# Patient Record
Sex: Male | Born: 1935 | Race: White | Hispanic: No | Marital: Single | Smoking: Never smoker
Health system: Southern US, Community
[De-identification: ages and names within clinical notes are randomized; demographics above are authoritative.]

## PROBLEM LIST (undated history)

## (undated) DIAGNOSIS — G629 Polyneuropathy, unspecified: Secondary | ICD-10-CM

## (undated) DIAGNOSIS — I82409 Acute embolism and thrombosis of unspecified deep veins of unspecified lower extremity: Secondary | ICD-10-CM

## (undated) DIAGNOSIS — G4700 Insomnia, unspecified: Secondary | ICD-10-CM

## (undated) DIAGNOSIS — R413 Other amnesia: Secondary | ICD-10-CM

## (undated) HISTORY — PX: OTHER SURGICAL HISTORY: SHX169

---

## 2016-05-26 ENCOUNTER — Encounter: Payer: Self-pay | Admitting: Emergency Medicine

## 2016-05-26 ENCOUNTER — Emergency Department: Payer: Medicare Other

## 2016-05-26 ENCOUNTER — Emergency Department
Admission: EM | Admit: 2016-05-26 | Discharge: 2016-05-26 | Disposition: A | Discharge status: Home / Self Care | Payer: Medicare Other | Attending: Emergency Medicine | Admitting: Emergency Medicine

## 2016-05-26 DIAGNOSIS — R2241 Localized swelling, mass and lump, right lower limb: Secondary | ICD-10-CM | POA: Diagnosis present

## 2016-05-26 DIAGNOSIS — R609 Edema, unspecified: Secondary | ICD-10-CM

## 2016-05-26 DIAGNOSIS — I824Z2 Acute embolism and thrombosis of unspecified deep veins of left distal lower extremity: Secondary | ICD-10-CM | POA: Insufficient documentation

## 2016-05-26 DIAGNOSIS — L03115 Cellulitis of right lower limb: Secondary | ICD-10-CM | POA: Insufficient documentation

## 2016-05-26 DIAGNOSIS — I82402 Acute embolism and thrombosis of unspecified deep veins of left lower extremity: Secondary | ICD-10-CM

## 2016-05-26 HISTORY — DX: Insomnia, unspecified: G47.00

## 2016-05-26 HISTORY — DX: Polyneuropathy, unspecified: G62.9

## 2016-05-26 HISTORY — DX: Other amnesia: R41.3

## 2016-05-26 HISTORY — DX: Acute embolism and thrombosis of unspecified deep veins of unspecified lower extremity: I82.409

## 2016-05-26 LAB — BASIC METABOLIC PANEL
ANION GAP: 8 (ref 5–15)
BUN: 17 mg/dL (ref 6–20)
CALCIUM: 8.5 mg/dL — AB (ref 8.9–10.3)
CHLORIDE: 106 mmol/L (ref 101–111)
CO2: 25 mmol/L (ref 22–32)
Creatinine, Ser: 0.95 mg/dL (ref 0.61–1.24)
GFR calc non Af Amer: >60 mL/min (ref >60)
Glucose, Bld: 178 mg/dL — ABNORMAL HIGH (ref 65–99)
Potassium: 3.4 mmol/L — ABNORMAL LOW (ref 3.5–5.1)
Sodium: 139 mmol/L (ref 135–145)

## 2016-05-26 LAB — CBC
HCT: 42.9 % (ref 40.0–52.0)
HEMOGLOBIN: 14.5 g/dL (ref 13.0–18.0)
MCH: 30.8 pg (ref 26.0–34.0)
MCHC: 33.8 g/dL (ref 32.0–36.0)
MCV: 91.2 fL (ref 80.0–100.0)
Platelets: 193 10*3/uL (ref 150–440)
RBC: 4.7 MIL/uL (ref 4.40–5.90)
RDW: 15.7 % — ABNORMAL HIGH (ref 11.5–14.5)
WBC: 9.7 10*3/uL (ref 3.8–10.6)

## 2016-05-26 LAB — TROPONIN I

## 2016-05-26 MED ORDER — CEPHALEXIN 500 MG PO CAPS
500.0000 mg | ORAL_CAPSULE | Freq: Once | ORAL | Status: AC
  Administered 2016-05-26: 500 mg via ORAL
  Filled 2016-05-26: qty 1

## 2016-05-26 MED ORDER — CEPHALEXIN 500 MG PO CAPS: 500.0000 mg | ORAL_CAPSULE | Freq: Three times a day (TID) | ORAL | 0 refills | Status: DC

## 2016-05-26 MED ORDER — ASPIRIN EC 325 MG PO TBEC
DELAYED_RELEASE_TABLET | ORAL | Status: AC
  Administered 2016-05-26: 325 mg
  Filled 2016-05-26: qty 1

## 2016-05-26 MED ORDER — ASPIRIN EC 325 MG PO TBEC: 325.0000 mg | DELAYED_RELEASE_TABLET | Freq: Every day | ORAL | 0 refills | Status: DC

## 2016-05-26 MED ORDER — ASPIRIN 325 MG PO TABS
325.0000 mg | ORAL_TABLET | Freq: Every day | ORAL | Status: DC
  Administered 2016-05-26: 325 mg via ORAL

## 2016-05-26 NOTE — ED Triage Notes (Signed)
Pt c/o ankle swelling on and off for 3/4 year. Reports started again today to both ankles. Denies SHOB or CP. Has pain to ankles.

## 2016-05-26 NOTE — ED Provider Notes (Addendum)
[facility] Emergency Department Provider Note  Time seen: 7:07 PM  I have reviewed the triage vital signs and the nursing notes.   HISTORY  Chief Complaint Leg Swelling    HPI Charles Nelson is a 82 y.o. male with a past medical history of insomnia, neuropathy, who presents the emergency department for right lower extremity swelling. According to the patient beginning this morning he has been experiencing some pain in his right foot with swelling of the foot and ankle. Patient denies any trauma. Patient denies any chest pain or trouble breathing. Patient states he has never had a DVT in the past however his past medical history suggests that he has. Patient denies any fever, nausea or vomiting.   Past Medical History:  Diagnosis Date  . DVT (deep venous thrombosis) ([place])   . Insomnia   . Memory loss   . Neuropathy ([place])     There are no active problems to display for this patient.   Past Surgical History:  Procedure Laterality Date  . pt does not remember surgery hx      Prior to Admission medications   Not on File    No Known Allergies  History reviewed. No pertinent family history.  Social History Social History  Substance Use Topics  . Smoking status: Never Smoker  . Smokeless tobacco: Not on file  . Alcohol use No    Review of Systems Constitutional: Negative for fever. Cardiovascular: Negative for chest pain. Respiratory: Negative for shortness of breath. Gastrointestinal: Negative for abdominal pain Neurological: Negative for headache 10-point ROS otherwise negative.  ____________________________________________   PHYSICAL EXAM:  VITAL SIGNS: ED Triage Vitals  Enc Vitals Group     BP 05/26/16 1745 126/68     Pulse Rate 05/26/16 1745 90     Resp 05/26/16 1745 18     Temp 05/26/16 1745 98.7 F (37.1 C)     Temp Source 05/26/16 1745 Oral     SpO2 05/26/16 1745 96 %     Weight 05/26/16 1746 205 lb (93 kg)     Height  05/26/16 1746 6' (1.829 m)     Head Circumference --      Peak Flow --      Pain Score 05/26/16 1746 8     Pain Loc --      Pain Edu? --      Excl. in [place]? --     Constitutional: Alert and oriented. Well appearing and in no distress. Eyes: Normal exam ENT   Head: Normocephalic and atraumatic.   Mouth/Throat: Mucous membranes are moist. Cardiovascular: Normal rate, regular rhythm. No murmur Respiratory: Normal respiratory effort without tachypnea nor retractions. Breath sounds are clear  Gastrointestinal: Soft and nontender. No distention.   Musculoskeletal:Normal range of motion. 2+ DP pulse bilaterally. The patient does have mild erythema of the right foot with mild tenderness to palpation, mild edema present. Left lower extremity appears normal.  Neurologic:  Normal speech and language. No gross focal neurologic deficits are appreciated. Skin:  Skin is warm, dry and intact.  Psychiatric: Mood and affect are normal. Speech and behavior are normal.   ____________________________________________    EKG  EKG reviewed and interpreted by myself shows normal sinus rhythm at 93 bpm, slightly widened QRS, normal axis, largely normal intervals with nonspecific ST changes, no concerning ST elevation.  ____________________________________________    RADIOLOGY  Chest x-ray shows no acute abnormality.  ____________________________________________   INITIAL IMPRESSION / ASSESSMENT AND PLAN / ED  COURSE  Pertinent labs & imaging results that were available during my care of the patient were reviewed by me and considered in my medical decision making (see chart for details).  The patient presents emergency department with right lower extremity swelling and tenderness beginning this morning. Patient doesn't mild erythema and edema on exam. Patient's labs are largely within normal limits. Troponin is negative. Chest x-ray is normal. We'll proceed with stent of the right lower extremity  to rule out DVT.   Ultrasound shows nonocclusive left lower extremity blood clot, no right lower extremity clot seen. As there is no right lower extremity clot highly suspect the patient has a mild degree of cellulitis in the right lower extremity. Patient is definitely confused at times in the emergency department. We've discussed the patient with his caregiver at the group home which he lives. The patient does not currently have a PCP. Patient suffers from memory loss. Patient has multiple bruises over his body. I do not believe the patient would be the best candidate for heavy anticoagulation. We'll place the patient on aspirin therapy and have him follow-up with a primary care doctor [facility] in one week for reevaluation and plan to repeat an ultrasound in the next several weeks to ensure resolution.  ----------------------------------------- 9:25 PM on 05/26/2016 -----------------------------------------  We have discussed the patient with his group home caregiver who states the patient has a history of DVT in the past, currently takes warfarin every night. We will discontinue the aspirin therapy as the patient will be taken warfarin. Patient will need follow-up with a primary care doctor [facility] on Monday for recheck of his INRs the patient will be started on an antibiotic. Patient and caregiver aware and agreeable to this plan. ____________________________________________   FINAL CLINICAL IMPRESSION(S) / ED DIAGNOSES  Deep venous thrombosis Cellulitis    Harvest Dark, MD 05/26/16 2039    Harvest Dark, MD 05/26/16 2126

## 2016-05-26 NOTE — ED Notes (Signed)
Pt from group home, poor historian. Pt states swelling started last night to RLE .

## 2016-05-26 NOTE — ED Notes (Signed)
Renee Ramus nurse  from [facility], asst living has been consulted and will be getting cab for pt back to facility

## 2016-05-26 NOTE — ED Triage Notes (Signed)
Patient to ER via [facility] from [facility] group home for c/o bilateral ankle swelling x6 months.

## 2016-05-26 NOTE — Discharge Instructions (Signed)
You have been seen in the emergency department for right leg pain. You have been diagnosed with a blood clot/DVT in the left lower leg, as well as an infection in the right lower leg. You have been prescribed an antibiotic please take his antibiotic as prescribed for its entire course. Please follow-up with [facility] clinic in the next 3-4 days for recheck of your INR and for ongoing treatment.

## 2016-07-23 ENCOUNTER — Encounter: Payer: Self-pay | Admitting: Emergency Medicine

## 2016-07-23 ENCOUNTER — Emergency Department: Payer: Medicare Other

## 2016-07-23 ENCOUNTER — Emergency Department
Admission: EM | Admit: 2016-07-23 | Discharge: 2016-07-23 | Disposition: A | Discharge status: Home / Self Care | Payer: Medicare Other | Attending: Emergency Medicine | Admitting: Emergency Medicine

## 2016-07-23 DIAGNOSIS — S51012A Laceration without foreign body of left elbow, initial encounter: Secondary | ICD-10-CM | POA: Insufficient documentation

## 2016-07-23 DIAGNOSIS — Z79899 Other long term (current) drug therapy: Secondary | ICD-10-CM | POA: Diagnosis not present

## 2016-07-23 DIAGNOSIS — R0689 Other abnormalities of breathing: Secondary | ICD-10-CM | POA: Insufficient documentation

## 2016-07-23 DIAGNOSIS — R609 Edema, unspecified: Secondary | ICD-10-CM | POA: Diagnosis not present

## 2016-07-23 DIAGNOSIS — Y92129 Unspecified place in nursing home as the place of occurrence of the external cause: Secondary | ICD-10-CM | POA: Insufficient documentation

## 2016-07-23 DIAGNOSIS — Y939 Activity, unspecified: Secondary | ICD-10-CM | POA: Diagnosis not present

## 2016-07-23 DIAGNOSIS — W19XXXA Unspecified fall, initial encounter: Secondary | ICD-10-CM | POA: Diagnosis not present

## 2016-07-23 DIAGNOSIS — S59912A Unspecified injury of left forearm, initial encounter: Secondary | ICD-10-CM | POA: Diagnosis present

## 2016-07-23 DIAGNOSIS — Y999 Unspecified external cause status: Secondary | ICD-10-CM | POA: Diagnosis not present

## 2016-07-23 LAB — CBC
HCT: 40.5 % (ref 40.0–52.0)
Hemoglobin: 13.8 g/dL (ref 13.0–18.0)
MCH: 31.3 pg (ref 26.0–34.0)
MCHC: 34.1 g/dL (ref 32.0–36.0)
MCV: 91.8 fL (ref 80.0–100.0)
PLATELETS: 217 10*3/uL (ref 150–440)
RBC: 4.41 MIL/uL (ref 4.40–5.90)
RDW: 14.6 % — AB (ref 11.5–14.5)
WBC: 8.9 10*3/uL (ref 3.8–10.6)

## 2016-07-23 LAB — TROPONIN I

## 2016-07-23 LAB — BRAIN NATRIURETIC PEPTIDE: B NATRIURETIC PEPTIDE 5: 73 pg/mL (ref 0.0–100.0)

## 2016-07-23 MED ORDER — FUROSEMIDE 20 MG PO TABS: 20.0000 mg | ORAL_TABLET | Freq: Every day | ORAL | 0 refills | Status: DC

## 2016-07-23 NOTE — ED Provider Notes (Signed)
[facility] Emergency Department Provider Note  Time seen: 3:20 PM  I have reviewed the triage vital signs and the nursing notes.   HISTORY  Chief Complaint Leg Swelling and Fall    HPI Charles Nelson is a 82 y.o. male with a past medical history of DVT, was on Coumadin until recently discontinued, neuropathy, mild dementia, presents to the emergency departmentwith lower extremity edema and a fall. According to report the patient lives at a nursing facility, had a fall earlier today did not hit his head or lose consciousness. Patient did not want to go to the hospital for evaluation at that time. Suffered a small skin tear to left elbow. However later in the day at family member visited the patient and saw increased lower extremity edema, so the patient was sent to the emergency department for evaluation of the edema. Patient states he has had lower extremity swelling in the past, does not know if he takes fluid pills. Denies any trouble breathing. Denies any chest pain. States his legs feel tight and are somewhat painful when he ambulates.  Past Medical History:  Diagnosis Date  . DVT (deep venous thrombosis) ([place])   . Insomnia   . Memory loss   . Neuropathy ([place])     There are no active problems to display for this patient.   Past Surgical History:  Procedure Laterality Date  . pt does not remember surgery hx      Prior to Admission medications   Medication Sig Start Date End Date Taking? Authorizing Provider  cephALEXin (KEFLEX) 500 MG capsule Take 1 capsule (500 mg total) by mouth 3 (three) times daily. 05/26/16   Harvest Dark, MD  cephALEXin (KEFLEX) 500 MG capsule Take 1 capsule (500 mg total) by mouth 3 (three) times daily. 05/26/16   Harvest Dark, MD    No Known Allergies  No family history on file.  Social History Social History  Substance Use Topics  . Smoking status: Never Smoker  . Smokeless tobacco: Never Used  . Alcohol use  No    Review of Systems Constitutional: Negative for fever. Cardiovascular: Negative for chest pain. Respiratory: Negative for shortness of breath. Gastrointestinal: Negative for abdominal pain Musculoskeletal: Negative for back pain.Swelling in bilateral legs. Neurological: Negative for headaches, focal weakness or numbness. 10-point ROS otherwise negative.  ____________________________________________   PHYSICAL EXAM:  VITAL SIGNS: ED Triage Vitals  Enc Vitals Group     BP 07/23/16 1516 (!) 148/80     Pulse Rate 07/23/16 1516 79     Resp 07/23/16 1516 17     Temp 07/23/16 1516 98.5 F (36.9 C)     Temp Source 07/23/16 1516 Oral     SpO2 07/23/16 1516 99 %     Weight 07/23/16 1518 180 lb 8 oz (81.9 kg)     Height 07/23/16 1518 6' (1.829 m)     Head Circumference --      Peak Flow --      Pain Score 07/23/16 1519 2     Pain Loc --      Pain Edu? --      Excl. in [place]? --     Constitutional: Alert. Well appearing and in no distress. Eyes: Normal exam ENT   Head: Normocephalic and atraumatic   Mouth/Throat: Mucous membranes are moist. Cardiovascular: Normal rate, regular rhythm. No murmur Respiratory: Normal respiratory effort without tachypnea nor retractions. Breath sounds are clear  Gastrointestinal: Soft and nontender. No distention. Musculoskeletal: Nontender  with normal range of motion in all extremities. 2+ lower extremity edema equal bilaterally. Mild tenderness. 2+ DP pulse. No erythema. Small skin tear approximately 1.5 cm to left elbow. Neurologic:  Normal speech and language. No gross focal neurologic deficits Skin:  Skin is warm, dry and intact.  Psychiatric: Mood and affect are normal.   ____________________________________________    EKG  EKG reviewed and interpreted by myself shows normal sinus rhythm at 75 bpm, slightly widened QRS, normal axis, largely normal intervals with nonspecific ST  changes.  ____________________________________________    RADIOLOGY  CXR negative  ____________________________________________   INITIAL IMPRESSION / ASSESSMENT AND PLAN / ED COURSE  Pertinent labs & imaging results that were available during my care of the patient were reviewed by me and considered in my medical decision making (see chart for details).  Patient presents emergency department with lower extremity edema. Patient also suffered a fall earlier today. Patient has a small skin tear to left elbow, hemostatic. No repair needed. No other evidence of trauma on examination. Patient does have 2+ lower extremity edema equal bilaterally. Mild tenderness, 2+ DP pulses. We will check labs, chest x-ray, EKG and closely monitor in the emergency department. I reviewed the patient's MAR from the nursing facility, he does not currently take a diuretic.  Chest x-ray is negative. Labs are largely within normal limits. Troponin is negative. BNP largely within normal limits. Given the patient's peripheral edema we'll start on Lasix. We'll have the patient follow up with a cardiologist for recheck within the next 1 week. Patient agreeable to plan.  ____________________________________________   FINAL CLINICAL IMPRESSION(S) / ED DIAGNOSES  Fall Skin tear Peripheral edema    Harvest Dark, MD 07/23/16 1742

## 2016-07-23 NOTE — ED Triage Notes (Signed)
Pt to ED via EMS from [place] c/o ankle and feet swelling for a couple days.  States mild pain and discomfort in feet worse when walking.  Pt also states had a fall today with skin tear to left elbow, denies hitting head.  Patient was on coumadin but recently taken off of.  Denies history of CHF, denies CP, or SOB.  Pt A&Ox4, speaking in complete and coherent sentences, chest rise even and unlabored.

## 2016-07-23 NOTE — ED Notes (Signed)
Spoke with employee, Lattie Haw, about patient's group home for report and transportation for patient on discharge.

## 2016-07-23 NOTE — ED Notes (Signed)
Patient transported to X-ray 

## 2016-07-23 NOTE — Discharge Instructions (Signed)
Please follow-up with cardiology by calling the number provided to arrange an echocardiogram. Please take his Lasix as prescribed each morning. Return to the emergency department for any trouble breathing, any chest pain, or any other symptom personally concerning to yourself.

## 2016-07-25 LAB — COMPREHENSIVE METABOLIC PANEL
ALT: 13 U/L — ABNORMAL LOW (ref 17–63)
ANION GAP: 6 (ref 5–15)
AST: 18 U/L (ref 15–41)
Albumin: 2.9 g/dL — ABNORMAL LOW (ref 3.5–5.0)
Alkaline Phosphatase: 57 U/L (ref 38–126)
BUN: 18 mg/dL (ref 6–20)
CHLORIDE: 107 mmol/L (ref 101–111)
CO2: 29 mmol/L (ref 22–32)
Calcium: 8.7 mg/dL — ABNORMAL LOW (ref 8.9–10.3)
Creatinine, Ser: 0.99 mg/dL (ref 0.61–1.24)
GFR calc Af Amer: >60 mL/min (ref >60)
Glucose, Bld: 99 mg/dL (ref 65–99)
POTASSIUM: 3.8 mmol/L (ref 3.5–5.1)
Sodium: 142 mmol/L (ref 135–145)
Total Bilirubin: 0.4 mg/dL (ref 0.3–1.2)
Total Protein: 5.7 g/dL — ABNORMAL LOW (ref 6.5–8.1)

## 2016-08-31 ENCOUNTER — Emergency Department: Payer: Medicare Other

## 2016-08-31 ENCOUNTER — Inpatient Hospital Stay
Admit: 2016-08-31 | Discharge: 2016-08-31 | Disposition: A | Discharge status: Home / Self Care | Payer: Medicare Other | Attending: Specialist | Admitting: Specialist

## 2016-08-31 ENCOUNTER — Encounter: Payer: Self-pay | Admitting: Emergency Medicine

## 2016-08-31 ENCOUNTER — Inpatient Hospital Stay
Admission: EM | Admit: 2016-08-31 | Discharge: 2016-09-03 | DRG: 176 | Disposition: A | Discharge status: Skilled Nursing Facility | Payer: Medicare Other | Attending: Internal Medicine | Admitting: Internal Medicine

## 2016-08-31 DIAGNOSIS — R600 Localized edema: Secondary | ICD-10-CM | POA: Diagnosis present

## 2016-08-31 DIAGNOSIS — R413 Other amnesia: Secondary | ICD-10-CM | POA: Diagnosis present

## 2016-08-31 DIAGNOSIS — I248 Other forms of acute ischemic heart disease: Secondary | ICD-10-CM | POA: Diagnosis present

## 2016-08-31 DIAGNOSIS — G629 Polyneuropathy, unspecified: Secondary | ICD-10-CM | POA: Diagnosis present

## 2016-08-31 DIAGNOSIS — I82433 Acute embolism and thrombosis of popliteal vein, bilateral: Secondary | ICD-10-CM | POA: Diagnosis present

## 2016-08-31 DIAGNOSIS — F0390 Unspecified dementia without behavioral disturbance: Secondary | ICD-10-CM | POA: Diagnosis present

## 2016-08-31 DIAGNOSIS — Z86711 Personal history of pulmonary embolism: Secondary | ICD-10-CM | POA: Diagnosis not present

## 2016-08-31 DIAGNOSIS — G4700 Insomnia, unspecified: Secondary | ICD-10-CM | POA: Diagnosis present

## 2016-08-31 DIAGNOSIS — Z79899 Other long term (current) drug therapy: Secondary | ICD-10-CM

## 2016-08-31 DIAGNOSIS — I82409 Acute embolism and thrombosis of unspecified deep veins of unspecified lower extremity: Secondary | ICD-10-CM | POA: Diagnosis present

## 2016-08-31 DIAGNOSIS — Z86718 Personal history of other venous thrombosis and embolism: Secondary | ICD-10-CM | POA: Diagnosis not present

## 2016-08-31 DIAGNOSIS — I2699 Other pulmonary embolism without acute cor pulmonale: Principal | ICD-10-CM | POA: Diagnosis present

## 2016-08-31 DIAGNOSIS — I4510 Unspecified right bundle-branch block: Secondary | ICD-10-CM | POA: Diagnosis present

## 2016-08-31 DIAGNOSIS — I82403 Acute embolism and thrombosis of unspecified deep veins of lower extremity, bilateral: Secondary | ICD-10-CM

## 2016-08-31 DIAGNOSIS — E876 Hypokalemia: Secondary | ICD-10-CM | POA: Diagnosis present

## 2016-08-31 DIAGNOSIS — M7989 Other specified soft tissue disorders: Secondary | ICD-10-CM | POA: Diagnosis present

## 2016-08-31 DIAGNOSIS — I82413 Acute embolism and thrombosis of femoral vein, bilateral: Secondary | ICD-10-CM | POA: Diagnosis present

## 2016-08-31 LAB — URINALYSIS, COMPLETE (UACMP) WITH MICROSCOPIC
Bacteria, UA: NONE SEEN
Bilirubin Urine: NEGATIVE
GLUCOSE, UA: NEGATIVE mg/dL
Hgb urine dipstick: NEGATIVE
KETONES UR: NEGATIVE mg/dL
Leukocytes, UA: NEGATIVE
Nitrite: NEGATIVE
PROTEIN: NEGATIVE mg/dL
Specific Gravity, Urine: 1.014 (ref 1.005–1.030)
Squamous Epithelial / LPF: NONE SEEN
pH: 6 (ref 5.0–8.0)

## 2016-08-31 LAB — CBC WITH DIFFERENTIAL/PLATELET
Basophils Absolute: 0 10*3/uL (ref 0–0.1)
Basophils Relative: 0 %
Eosinophils Absolute: 0 10*3/uL (ref 0–0.7)
Eosinophils Relative: 0 %
HEMATOCRIT: 38.2 % — AB (ref 40.0–52.0)
Hemoglobin: 13 g/dL (ref 13.0–18.0)
LYMPHS PCT: 12 %
Lymphs Abs: 1.8 10*3/uL (ref 1.0–3.6)
MCH: 30.9 pg (ref 26.0–34.0)
MCHC: 33.9 g/dL (ref 32.0–36.0)
MCV: 90.9 fL (ref 80.0–100.0)
MONO ABS: 2 10*3/uL — AB (ref 0.2–1.0)
MONOS PCT: 13 %
NEUTROS ABS: 12.1 10*3/uL — AB (ref 1.4–6.5)
Neutrophils Relative %: 75 %
Platelets: 214 10*3/uL (ref 150–440)
RBC: 4.2 MIL/uL — ABNORMAL LOW (ref 4.40–5.90)
RDW: 13.5 % (ref 11.5–14.5)
WBC: 16 10*3/uL — ABNORMAL HIGH (ref 3.8–10.6)

## 2016-08-31 LAB — BASIC METABOLIC PANEL
Anion gap: 9 (ref 5–15)
BUN: 16 mg/dL (ref 6–20)
CALCIUM: 8.6 mg/dL — AB (ref 8.9–10.3)
CO2: 27 mmol/L (ref 22–32)
CREATININE: 0.98 mg/dL (ref 0.61–1.24)
Chloride: 100 mmol/L — ABNORMAL LOW (ref 101–111)
GFR calc Af Amer: >60 mL/min (ref >60)
GFR calc non Af Amer: >60 mL/min (ref >60)
Glucose, Bld: 107 mg/dL — ABNORMAL HIGH (ref 65–99)
Potassium: 3.6 mmol/L (ref 3.5–5.1)
Sodium: 136 mmol/L (ref 135–145)

## 2016-08-31 LAB — BRAIN NATRIURETIC PEPTIDE: B Natriuretic Peptide: 62 pg/mL (ref 0.0–100.0)

## 2016-08-31 LAB — TROPONIN I
Troponin I: 0.03 ng/mL (ref <0.03)
Troponin I: 0.03 ng/mL (ref <0.03)

## 2016-08-31 MED ORDER — VITAMIN B-12 1000 MCG PO TABS
2000.0000 ug | ORAL_TABLET | Freq: Every day | ORAL | Status: DC
  Administered 2016-09-01 – 2016-09-03 (×3): 2000 ug via ORAL
  Filled 2016-08-31 (×3): qty 2

## 2016-08-31 MED ORDER — ONDANSETRON HCL 4 MG PO TABS: 4.0000 mg | ORAL_TABLET | Freq: Four times a day (QID) | ORAL | Status: DC | PRN

## 2016-08-31 MED ORDER — ACETAMINOPHEN 325 MG PO TABS
650.0000 mg | ORAL_TABLET | Freq: Once | ORAL | Status: AC | PRN
  Administered 2016-08-31: 650 mg via ORAL
  Filled 2016-08-31: qty 2

## 2016-08-31 MED ORDER — ONDANSETRON HCL 4 MG/2ML IJ SOLN: 4.0000 mg | Freq: Four times a day (QID) | INTRAMUSCULAR | Status: DC | PRN

## 2016-08-31 MED ORDER — RISPERIDONE 1 MG PO TABS
2.0000 mg | ORAL_TABLET | Freq: Every day | ORAL | Status: DC
  Administered 2016-08-31 – 2016-09-02 (×3): 2 mg via ORAL
  Filled 2016-08-31 (×3): qty 2

## 2016-08-31 MED ORDER — MELATONIN 5 MG PO TABS
10.0000 mg | ORAL_TABLET | Freq: Every day | ORAL | Status: DC
  Administered 2016-08-31 – 2016-09-02 (×3): 10 mg via ORAL
  Filled 2016-08-31 (×4): qty 2

## 2016-08-31 MED ORDER — ENOXAPARIN SODIUM 80 MG/0.8ML ~~LOC~~ SOLN
1.0000 mg/kg | Freq: Once | SUBCUTANEOUS | Status: AC
  Administered 2016-08-31: 75 mg via SUBCUTANEOUS
  Filled 2016-08-31: qty 0.8

## 2016-08-31 MED ORDER — TRAZODONE HCL 100 MG PO TABS
100.0000 mg | ORAL_TABLET | Freq: Every day | ORAL | Status: DC
  Administered 2016-08-31 – 2016-09-02 (×3): 100 mg via ORAL
  Filled 2016-08-31 (×3): qty 1

## 2016-08-31 MED ORDER — ACETAMINOPHEN 650 MG RE SUPP: 650.0000 mg | Freq: Four times a day (QID) | RECTAL | Status: DC | PRN

## 2016-08-31 MED ORDER — ENOXAPARIN SODIUM 100 MG/ML ~~LOC~~ SOLN
SUBCUTANEOUS | Status: AC
  Administered 2016-08-31: 75 mg via SUBCUTANEOUS
  Filled 2016-08-31: qty 1

## 2016-08-31 MED ORDER — IOPAMIDOL (ISOVUE-370) INJECTION 76%
75.0000 mL | Freq: Once | INTRAVENOUS | Status: AC | PRN
  Administered 2016-08-31: 75 mL via INTRAVENOUS

## 2016-08-31 MED ORDER — ENOXAPARIN SODIUM 80 MG/0.8ML ~~LOC~~ SOLN
1.0000 mg/kg | Freq: Two times a day (BID) | SUBCUTANEOUS | Status: DC
  Administered 2016-09-01 (×2): 75 mg via SUBCUTANEOUS
  Filled 2016-08-31 (×3): qty 0.8

## 2016-08-31 MED ORDER — VITAMIN D 1000 UNITS PO TABS
2000.0000 [IU] | ORAL_TABLET | Freq: Every day | ORAL | Status: DC
  Administered 2016-09-01 – 2016-09-03 (×3): 2000 [IU] via ORAL
  Filled 2016-08-31 (×3): qty 2

## 2016-08-31 MED ORDER — DOCUSATE SODIUM 100 MG PO CAPS
100.0000 mg | ORAL_CAPSULE | Freq: Two times a day (BID) | ORAL | Status: DC | PRN
  Administered 2016-09-01: 100 mg via ORAL
  Filled 2016-08-31: qty 1

## 2016-08-31 MED ORDER — ACETAMINOPHEN 325 MG PO TABS: 650.0000 mg | ORAL_TABLET | Freq: Four times a day (QID) | ORAL | Status: DC | PRN

## 2016-08-31 NOTE — ED Notes (Signed)
Pt repositioned at this time, pull up checked, pt is clean and dry.

## 2016-08-31 NOTE — ED Notes (Signed)
In and out cath completed by this RN. Peri-care performed before cath. Patient tolerated well. Clear, amber urine returned. Sterile technique maintained.

## 2016-08-31 NOTE — ED Triage Notes (Signed)
Per [facility], patient comes from [facility] c/o left leg edema x 3 months. Patient dx with DVT "awhile ago", patient has been on and off coumadin. Per staff, every time patient goes off coumadin his leg swells up. Patient currently off coumadin, stopped 06/21/16. MD at group home has doubled lasix dose since 08/19/16. Patient is A&O x2, disoriented to time and situation. Patient has a Charles Nelson, Charles Nelson ([phone]) [phone].

## 2016-08-31 NOTE — ED Notes (Signed)
Patient transported to [facility] 

## 2016-08-31 NOTE — H&P (Signed)
[facility] [place] NAME: Charles Nelson    MR#:  [id number]  DATE OF BIRTH:  07-15-1930  DATE OF ADMISSION:  08/31/2016  PRIMARY CARE PHYSICIAN: No PCP Per Patient   REQUESTING/REFERRING PHYSICIAN: Dr. Rudene Re  CHIEF COMPLAINT:   Chief Complaint  Patient presents with  . Leg Swelling    HISTORY OF PRESENT ILLNESS:  Charles Nelson  is a 83 y.o. male with a known history of Previous history of DVT, insomnia, memory loss, neuropathy who presents to the hospital due to left lower extremity swelling. Patient himself has advanced dementia and therefore most of history obtained over the phone with the patient's power of attorney. Patient was previously diagnosed with a DVT about 6 months ago at [facility] and placed on oral Coumadin. He was treated for about 3 months and then taken off the Coumadin recently. He has been complaining of leg pain and worsening left lower extremity swelling and therefore was sent to the ER for further evaluation. Patient emergency room underwent Dopplers of his lower extremity which showed bilateral DVT and also underwent CT image of the chest showing bilateral pulmonary emboli. Hospitalist services were contacted further treatment and evaluation. Patient clinically does not complain of any chest pain, shortness of breath or has any hemoptysis and is presently hemodynamically stable.  PAST MEDICAL HISTORY:   Past Medical History:  Diagnosis Date  . DVT (deep venous thrombosis) ([place])   . Insomnia   . Memory loss   . Neuropathy ([place])     PAST SURGICAL HISTORY:   Past Surgical History:  Procedure Laterality Date  . pt does not remember surgery hx      SOCIAL HISTORY:   Social History  Substance Use Topics  . Smoking status: Never Smoker  . Smokeless tobacco: Never Used  . Alcohol use No    FAMILY HISTORY:   Family History  Problem Relation Age of Onset  . Diabetes Neg Hx   . Hypertension Neg Hx   .  Deep vein thrombosis Neg Hx     DRUG ALLERGIES:  No Known Allergies  REVIEW OF SYSTEMS:   Review of Systems  Unable to perform ROS: Mental acuity    MEDICATIONS AT HOME:   Prior to Admission medications   Medication Sig Start Date End Date Taking? Authorizing Provider  docusate sodium (COLACE) 100 MG capsule Take 100 mg by mouth 2 (two) times daily as needed for mild constipation.   Yes Historical Provider, MD  Ergocalciferol (VITAMIN D2) 2000 units TABS Take 2,000 Units by mouth daily.   Yes Historical Provider, MD  furosemide (LASIX) 20 MG tablet Take 1 tablet (20 mg total) by mouth daily. Patient taking differently: Take 20 mg by mouth 2 (two) times daily.  07/23/16 07/23/17 Yes Harvest Dark, MD  Melatonin 10 MG TABS Take 10 mg by mouth at bedtime.   Yes Historical Provider, MD  risperiDONE (RISPERDAL) 2 MG tablet Take 2 mg by mouth at bedtime.   Yes Historical Provider, MD  traZODone (DESYREL) 100 MG tablet Take 100 mg by mouth at bedtime.   Yes Historical Provider, MD  vitamin B-12 (CYANOCOBALAMIN) 1000 MCG tablet Take 2,000 mcg by mouth daily.   Yes Historical Provider, MD      VITAL SIGNS:  Blood pressure (!) 152/77, pulse 80, temperature 97.8 F (36.6 C), temperature source Oral, resp. rate 17, height 5\' 10"  (1.778 m), weight 77.1 kg (170 lb), SpO2 96 %.  PHYSICAL EXAMINATION:  Physical Exam  GENERAL:  83 y.o.-year-old patient lying in the bed in no acute distress.  EYES: Pupils equal, round, reactive to light and accommodation. No scleral icterus. Extraocular muscles intact.  HEENT: Head atraumatic, normocephalic. Oropharynx and nasopharynx clear. No oropharyngeal erythema, moist oral mucosa  NECK:  Supple, no jugular venous distention. No thyroid enlargement, no tenderness.  LUNGS: Normal breath sounds bilaterally, no wheezing, rales, rhonchi. No use of accessory muscles of respiration.  CARDIOVASCULAR: S1, S2 RRR. No murmurs, rubs, gallops, clicks.  ABDOMEN:  Soft, nontender, nondistended. Bowel sounds present. No organomegaly or mass.  EXTREMITIES: + edema L > R, cyanosis, or clubbing. + 2 pedal & radial pulses b/l.  LLE edema > right NEUROLOGIC: Cranial nerves II through XII are intact. No focal Motor or sensory deficits appreciated b/l. Globally weak.  PSYCHIATRIC: The patient is alert and oriented x 1.  SKIN: No obvious rash, lesion, or ulcer.   LABORATORY PANEL:   CBC  Recent Labs Lab 08/31/16 1106  WBC 16.0*  HGB 13.0  HCT 38.2*  PLT 214   ------------------------------------------------------------------------------------------------------------------  Chemistries   Recent Labs Lab 08/31/16 1106  NA 136  K 3.6  CL 100*  CO2 27  GLUCOSE 107*  BUN 16  CREATININE 0.98  CALCIUM 8.6*   ------------------------------------------------------------------------------------------------------------------  Cardiac Enzymes  Recent Labs Lab 08/31/16 1409  TROPONINI 0.03*   ------------------------------------------------------------------------------------------------------------------  RADIOLOGY:  Ct Angio Chest Pe W And/or Wo Contrast  Result Date: 08/31/2016 CLINICAL DATA:  Leg edema for 3 months. Prior DVT and Coumadin therapy EXAM: CT ANGIOGRAPHY CHEST WITH CONTRAST TECHNIQUE: Multidetector CT imaging of the chest was performed using the standard protocol during bolus administration of intravenous contrast. Multiplanar CT image reconstructions and MIPs were obtained to evaluate the vascular anatomy. CONTRAST:  75 mL Isovue COMPARISON:  Chest radiograph 08/31/2016 FINDINGS: Cardiovascular: Tubular filling defect fills and occludes the proximal RIGHT lower lobe pulmonary artery (image 51, series 4). This filling defect extends into the more distal segmental branches. Filling defect within the proximal RIGHT upper lobe pulmonary artery (image 32, series 4). Tubular filling defect within the lingular pulmonary artery (image 34,  series 4) The RIGHT ventricular diameter to LEFT ventricular diameter ratio is less than 1 indicating no RIGHT heart strain. Overall clot burden is moderate. Mediastinum/Nodes: No axillary supraclavicular adenopathy. No mediastinal hilar adenopathy. No pericardial fluid Lungs/Pleura: Within the posterior RIGHT lower lobe there is peripheral airspace disease concerning for a pulmonary infarction. There is branching peripheral nodular pattern in the lateral RIGHT lower lobe and RIGHT middle lobe. Upper Abdomen: Limited view of the liver, kidneys, pancreas are unremarkable. Normal adrenal glands. Musculoskeletal: No aggressive osseous lesion. Review of the MIP images confirms the above findings. IMPRESSION: 1. Acute pulmonary embolism within the RIGHT lower lobe pulmonary artery, RIGHT upper lobe pulmonary artery and lingular pulmonary artery. 2. Overall clot burden is moderate with no evidence of RIGHT ventricular strain. 3. Probable pulmonary infarction in the RIGHT lower lobe. 4. Fine branching nodular pattern in the RIGHT lung suggests chronic infectious process. Critical Value/emergent results were called by telephone at the time of interpretation on 08/31/2016 at 2:12 pm to Dr. Corky Downs, who verbally acknowledged these results. Electronically Signed   By: Suzy Bouchard M.D.   On: 08/31/2016 14:16   US Venous Img Lower Bilateral  Result Date: 08/31/2016 CLINICAL DATA:  Bilateral lower extremity edema, left greater than right. Recent DVT of left lower extremity with previous demonstration of nonocclusive thrombus in the left common femoral vein.  EXAM: BILATERAL LOWER EXTREMITY VENOUS DOPPLER ULTRASOUND TECHNIQUE: Gray-scale sonography with graded compression, as well as color Doppler and duplex ultrasound were performed to evaluate the lower extremity deep venous systems from the level of the common femoral vein and including the common femoral, femoral, profunda femoral, popliteal and calf veins including the  posterior tibial, peroneal and gastrocnemius veins when visible. The superficial great saphenous vein was also interrogated. Spectral Doppler was utilized to evaluate flow at rest and with distal augmentation maneuvers in the common femoral, femoral and popliteal veins. COMPARISON:  05/26/2016 FINDINGS: RIGHT LOWER EXTREMITY Common Femoral Vein: Nonocclusive thrombus identified. Saphenofemoral Junction: No evidence of thrombus. Normal compressibility and flow on color Doppler imaging. Profunda Femoral Vein: No evidence of thrombus. Normal compressibility and flow on color Doppler imaging. Femoral Vein: Nonocclusive thrombus which is nearly occlusive throughout the thigh. Popliteal Vein: Nonocclusive thrombus identified in the popliteal vein. Calf Veins: No evidence of thrombus. Normal compressibility and flow on color Doppler imaging. Superficial Great Saphenous Vein: No evidence of thrombus. Normal compressibility and flow on color Doppler imaging. Venous Reflux:  None. Other Findings:  No abnormal fluid collections. LEFT LOWER EXTREMITY Common Femoral Vein: Nonocclusive thrombus identified in the left common femoral vein. Saphenofemoral Junction: No evidence of thrombus. Normal compressibility and flow on color Doppler imaging. Profunda Femoral Vein: No evidence of thrombus. Normal compressibility and flow on color Doppler imaging. Femoral Vein: Nonocclusive thrombus identified in the left femoral vein throughout the thigh a which is nearly occlusive. Popliteal Vein: Nonocclusive thrombus identified in the popliteal vein. Calf Veins: No evidence of thrombus. Normal compressibility and flow on color Doppler imaging. Superficial Great Saphenous Vein: No evidence of thrombus. Normal compressibility and flow on color Doppler imaging. Venous Reflux:  None. Other Findings:  No abnormal fluid collections. IMPRESSION: Similar pattern of bilateral lower extremity DVT with nonocclusive thrombus in the common femoral veins,  femoral veins and popliteal veins. Thrombus is nearly occlusive at the level of bilateral femoral and popliteal veins. Electronically Signed   By: Aletta Edouard M.D.   On: 08/31/2016 12:43   Dg Chest Portable 1 View  Result Date: 08/31/2016 CLINICAL DATA:  Left leg edema for 3 months. EXAM: PORTABLE CHEST 1 VIEW COMPARISON:  None. FINDINGS: 1256 hours. Rightward rotation. Interstitial and reticular opacity right lung base, as before. Similar but less prominent changes noted left lung base, also stable. No edema or focal airspace consolidation. No substantial pleural effusion. The cardio pericardial silhouette is enlarged. The visualized bony structures of the thorax are intact. Status post right shoulder replacement. Telemetry leads overlie the chest. IMPRESSION: Stable.  No acute cardiopulmonary findings. Electronically Signed   By: Misty Stanley M.D.   On: 08/31/2016 13:13     IMPRESSION AND PLAN:   83 year old male with past medical history of dementia, previous history of DVT, who presented to the hospital due to worsening left lower extremity swelling and noted to have bilateral DVT and pulmonary emboli.   1. DVT/pulmonary embolus-patient noted to have bilateral extensive DVT along with bilateral pulmonary emboli. -This is a cause of patient's worsening lower extremity edema. Patient has a previous history of DVT and was recently taken off Coumadin. This is his second episode of thromboembolism -Unclear this is a provoked/unprovoked DVT. I suspect this is probably secondary to poor mobility. -I will start the patient on Lovenox, and eventually patient will be need to switch to over to a NOAC - hemodynamically stable.   2. Elevated Troponin - due to demand  ischemia.  - will check Echo.   3. Dementia - cont. Risperdal, Trazodone.   Discussed plan of care w/ patients POA April (919) [id number].     All the records are reviewed and case discussed with ED provider. Management plans  discussed with the patient, family and they are in agreement.  CODE STATUS: Full Code  TOTAL TIME TAKING CARE OF THIS PATIENT: 45 minutes.    Henreitta Leber M.D on 08/31/2016 at 4:01 PM  Between 7am to 6pm - Pager - [phone]  After 6pm go to [email or web address] - password [email or web address] [facility]  [facility]  Office  [phone]  CC: Primary care physician; No PCP Per Patient

## 2016-08-31 NOTE — ED Notes (Signed)
Patient transported to CT 

## 2016-08-31 NOTE — ED Provider Notes (Signed)
[facility] Emergency Department Provider Note  ____________________________________________  Time seen: Approximately 11:36 AM  I have reviewed the triage vital signs and the nursing notes.   HISTORY  Chief Complaint Leg Swelling  Level 5 caveat:  Portions of the history and physical were unable to be obtained due to dementia   HPI Charles Nelson is a 83 y.o. male history of DVT and lower extremity edema who presents for evaluation of worsening edema. According to the group home patient has had bilateral edema worse on the left for the last 3 months. He was diagnosed with a DVT back in September 2017 and was started on Coumadin. Patient has been off of Coumadin since the end of October. 10 days ago his Lasix was doubled from 10 daily to 20 daily with no improvement of patient's swelling. Patient denies any pain in his legs, fever or chills, chest pain or shortness of breath.   Past Medical History:  Diagnosis Date  . DVT (deep venous thrombosis) ([place])   . Insomnia   . Memory loss   . Neuropathy ([place])     There are no active problems to display for this patient.   Past Surgical History:  Procedure Laterality Date  . pt does not remember surgery hx      Prior to Admission medications   Medication Sig Start Date End Date Taking? Authorizing Provider  docusate sodium (COLACE) 100 MG capsule Take 100 mg by mouth 2 (two) times daily as needed for mild constipation.   Yes Historical Provider, MD  Ergocalciferol (VITAMIN D2) 2000 units TABS Take 2,000 Units by mouth daily.   Yes Historical Provider, MD  furosemide (LASIX) 20 MG tablet Take 1 tablet (20 mg total) by mouth daily. Patient taking differently: Take 20 mg by mouth 2 (two) times daily.  07/23/16 07/23/17 Yes Harvest Dark, MD  Melatonin 10 MG TABS Take 10 mg by mouth at bedtime.   Yes Historical Provider, MD  risperiDONE (RISPERDAL) 2 MG tablet Take 2 mg by mouth at bedtime.   Yes Historical  Provider, MD  traZODone (DESYREL) 100 MG tablet Take 100 mg by mouth at bedtime.   Yes Historical Provider, MD  vitamin B-12 (CYANOCOBALAMIN) 1000 MCG tablet Take 2,000 mcg by mouth daily.   Yes Historical Provider, MD    Allergies Patient has no known allergies.  No family history on file.  Social History Social History  Substance Use Topics  . Smoking status: Never Smoker  . Smokeless tobacco: Never Used  . Alcohol use No    Review of Systems Constitutional: Negative for fever. Eyes: Negative for visual changes. ENT: Negative for sore throat. Neck: No neck pain  Cardiovascular: Negative for chest pain. Respiratory: Negative for shortness of breath. Gastrointestinal: Negative for abdominal pain, vomiting or diarrhea. Genitourinary: Negative for dysuria. Musculoskeletal: Negative for back pain. + b/l LE L>R Skin: Negative for rash. Neurological: Negative for headaches, weakness or numbness. Psych: No SI or HI  ____________________________________________   PHYSICAL EXAM:  VITAL SIGNS: ED Triage Vitals  Enc Vitals Group     BP 08/31/16 1100 117/61     Pulse Rate 08/31/16 1100 94     Resp 08/31/16 1100 17     Temp 08/31/16 1103 99.8 F (37.7 C)     Temp Source 08/31/16 1103 Oral     SpO2 08/31/16 1100 96 %     Weight 08/31/16 1104 170 lb (77.1 kg)     Height 08/31/16 1104 5\' 10"  (  1.778 m)     Head Circumference --      Peak Flow --      Pain Score --      Pain Loc --      Pain Edu? --      Excl. in [place]? --     Constitutional: Alert and oriented. Well appearing and in no apparent distress. HEENT:      Head: Normocephalic and atraumatic.         Eyes: Conjunctivae are normal. Sclera is non-icteric. EOMI. PERRL      Mouth/Throat: Mucous membranes are moist.       Neck: Supple with no signs of meningismus. Cardiovascular: Regular rate and rhythm. No murmurs, gallops, or rubs. 2+ symmetrical distal pulses are present in all extremities. No JVD. Respiratory:  Normal respiratory effort. Lungs are clear to auscultation bilaterally. No wheezes, crackles, or rhonchi.  Gastrointestinal: Soft, non tender, and non distended with positive bowel sounds. No rebound or guarding. Musculoskeletal: 1+ pitting edema on the right lower extremity, 2+ pitting edema on the left lower extremity from the knee all the way to the ankle, no warmth or erythema.  Neurologic: Normal speech and language. Face is symmetric. Moving all extremities. No gross focal neurologic deficits are appreciated. Skin: Skin is warm, dry and intact. No rash noted. Psychiatric: Mood and affect are normal. Speech and behavior are normal.  ____________________________________________   LABS (all labs ordered are listed, but only abnormal results are displayed)  Labs Reviewed  CBC WITH DIFFERENTIAL/PLATELET - Abnormal; Notable for the following:       Result Value   WBC 16.0 (*)    RBC 4.20 (*)    HCT 38.2 (*)    Neutro Abs 12.1 (*)    Monocytes Absolute 2.0 (*)    All other components within normal limits  BASIC METABOLIC PANEL - Abnormal; Notable for the following:    Chloride 100 (*)    Glucose, Bld 107 (*)    Calcium 8.6 (*)    All other components within normal limits  TROPONIN I - Abnormal; Notable for the following:    Troponin I 0.03 (*)    All other components within normal limits  URINALYSIS, COMPLETE (UACMP) WITH MICROSCOPIC - Abnormal; Notable for the following:    Color, Urine YELLOW (*)    APPearance CLEAR (*)    All other components within normal limits  TROPONIN I - Abnormal; Notable for the following:    Troponin I 0.03 (*)    All other components within normal limits  BRAIN NATRIURETIC PEPTIDE   ____________________________________________  EKG  ED ECG REPORT I, Rudene Re, the attending physician, personally viewed and interpreted this ECG.  Sinus rhythm, rate of 82, right bundle branch block, normal QTc interval, normal axis, no ST elevations or  depressions. Unchanged from prior. ____________________________________________  RADIOLOGY  Doppler: Similar pattern of bilateral lower extremity DVT with nonocclusive thrombus in the common femoral veins, femoral veins and popliteal veins. Thrombus is nearly occlusive at the level of bilateral femoral and popliteal veins. ____________________________________________   PROCEDURES  Procedure(s) performed: None Procedures Critical Care performed: yes  CRITICAL CARE Performed by: Rudene Re  ?  Total critical care time: 40 min  Critical care time was exclusive of separately billable procedures and treating other patients.  Critical care was necessary to treat or prevent imminent or life-threatening deterioration.  Critical care was time spent personally by me on the following activities: development of treatment plan with patient and/or  surrogate as well as nursing, discussions with consultants, evaluation of patient's response to treatment, examination of patient, obtaining history from patient or surrogate, ordering and performing treatments and interventions, ordering and review of laboratory studies, ordering and review of radiographic studies, pulse oximetry and re-evaluation of patient's condition.  ____________________________________________   INITIAL IMPRESSION / ASSESSMENT AND PLAN / ED COURSE  83 y.o. male history of DVT and lower extremity edema who presents for evaluation of worsening edema on left lower extremity. Patient has asymmetric pitting edema worse on the left lower extremity from the knee down with no erythema or warmth, no obvious deformities. His vital signs are within normal limits. We'll check a Doppler studies to rule out an acute DVT. We'll also check patient's kidney function and a BNP for any evidence of heart failure although patient has no crackles, no shortness of breath, and normal sats on room air. We'll watch patient on  telemetry.  Clinical Course as of Aug 31 1509  Tue Aug 31, 2016  1346 Venous Doppler showing diffuse DVT on bilateral lower extremities. I spoke with the [facility] primary care [place] who is the patient's primary care doctor and I was told that the patient was initially diagnosed with acute DVT in July at [facility] and he was put on Coumadin. He was a Coumadin for 3 months and that was discontinued by his primary care doctor due to high risk of bleeding as patient has balance issues. I spoke with patient's HCPOA April about the findings of the Doppler studies and risks and benefits of the anticoagulation at this time. I explained to her that if patient is not put again on anticoagulation there is a chance the patient may develop a large pulmonary embolism that could lead to his death. I also explained that if he is back on anticoagulation and falls he may also develop a head bleed that could also lead to his death or patient can even have a GI bleed or some other type of life-threatening bleed. HCPOA understands the risks of both options and decided to restart patient on anticoagulation. I explained that she should follow up closely with patient's primary care doctor to talk about IVC filter as an alternative to anticoagulation. CTA of the chest is pending to rule out pulmonary embolism.  [CV]  [id number] concerning for acute PE with moderate clot burden and pulmonary infarction. Will admit to the Hospitalist. Patient started on lovenox.  [CV]    Clinical Course User Index [CV] Rudene Re, MD    Pertinent labs & imaging results that were available during my care of the patient were reviewed by me and considered in my medical decision making (see chart for details).    ____________________________________________   FINAL CLINICAL IMPRESSION(S) / ED DIAGNOSES  Final diagnoses:  Acute deep vein thrombosis (DVT) of both lower extremities, unspecified vein (HCC)  Other acute pulmonary embolism without  acute cor pulmonale (HCC)  Pulmonary infarction (HCC)      NEW MEDICATIONS STARTED DURING THIS VISIT:  New Prescriptions   No medications on file     Note:  This document was prepared using Dragon voice recognition software and may include unintentional dictation errors.    Rudene Re, MD 08/31/16 [phone]

## 2016-09-01 LAB — CBC
HEMATOCRIT: 34.8 % — AB (ref 40.0–52.0)
Hemoglobin: 11.7 g/dL — ABNORMAL LOW (ref 13.0–18.0)
MCH: 31 pg (ref 26.0–34.0)
MCHC: 33.7 g/dL (ref 32.0–36.0)
MCV: 92 fL (ref 80.0–100.0)
Platelets: 193 10*3/uL (ref 150–440)
RBC: 3.78 MIL/uL — ABNORMAL LOW (ref 4.40–5.90)
RDW: 13.7 % (ref 11.5–14.5)
WBC: 10.5 10*3/uL (ref 3.8–10.6)

## 2016-09-01 LAB — BASIC METABOLIC PANEL
Anion gap: 8 (ref 5–15)
BUN: 16 mg/dL (ref 6–20)
CHLORIDE: 103 mmol/L (ref 101–111)
CO2: 28 mmol/L (ref 22–32)
Calcium: 8.4 mg/dL — ABNORMAL LOW (ref 8.9–10.3)
Creatinine, Ser: 0.88 mg/dL (ref 0.61–1.24)
GFR calc Af Amer: >60 mL/min (ref >60)
GFR calc non Af Amer: >60 mL/min (ref >60)
Glucose, Bld: 94 mg/dL (ref 65–99)
POTASSIUM: 3.3 mmol/L — AB (ref 3.5–5.1)
SODIUM: 139 mmol/L (ref 135–145)

## 2016-09-01 LAB — ECHOCARDIOGRAM COMPLETE
HEIGHTINCHES: 72 in
WEIGHTICAEL: 2715.2 [oz_av]

## 2016-09-01 MED ORDER — POTASSIUM CHLORIDE CRYS ER 20 MEQ PO TBCR
40.0000 meq | EXTENDED_RELEASE_TABLET | Freq: Once | ORAL | Status: AC
  Administered 2016-09-01: 40 meq via ORAL
  Filled 2016-09-01: qty 2

## 2016-09-01 MED ORDER — APIXABAN 5 MG PO TABS
10.0000 mg | ORAL_TABLET | Freq: Two times a day (BID) | ORAL | Status: DC
  Administered 2016-09-01 – 2016-09-03 (×4): 10 mg via ORAL
  Filled 2016-09-01 (×5): qty 2

## 2016-09-01 MED ORDER — APIXABAN 5 MG PO TABS: 5.0000 mg | ORAL_TABLET | Freq: Two times a day (BID) | ORAL | Status: DC

## 2016-09-01 NOTE — Progress Notes (Signed)
ANTICOAGULATION CONSULT NOTE - Initial Consult  Pharmacy Consult for apixaban Indication: DVT & Bilateral PE  No Known Allergies  Patient Measurements: Height: 6' (182.9 cm) Weight: 169 lb 11.2 oz (77 kg) IBW/kg (Calculated) : 77.6  Vital Signs: Temp: 98.3 F (36.8 C) (01/03 1247) Temp Source: Oral (01/03 1247) BP: 124/64 (01/03 1247) Pulse Rate: 76 (01/03 1247)  Labs:  Recent Labs  08/31/16 1106 08/31/16 1409 09/01/16 0512  HGB 13.0  --  11.7*  HCT 38.2*  --  34.8*  PLT 214  --  193  CREATININE 0.98  --  0.88  TROPONINI 0.03* 0.03*  --     Estimated Creatinine Clearance: 68.1 mL/min (by C-G formula based on SCr of 0.88 mg/dL).   Medical History: Past Medical History:  Diagnosis Date  . DVT (deep venous thrombosis) ([place])   . Insomnia   . Memory loss   . Neuropathy ([place])     Assessment: Patient with DVT and bilateral PE started on enoxaparin to transition to apixaban   Plan:  Will discontinue enoxaparin and transition to apixaban 10mg  PO BID x 7 days followed by 5mg  PO BID. First dose to be given tonight at 2300.   Angeleena Dueitt C 09/01/2016,4:01 PM

## 2016-09-01 NOTE — Evaluation (Signed)
Physical Therapy Evaluation Patient Details Name: Charles Nelson MRN: [id number] DOB: 09-14-1929 Today's Date: 09/01/2016   History of Present Illness  83 yo male with onset of acute RLL PE, now cleared for PT with chronic lung infection noted, elevated troponin from demand ischemia.   PMHx:  advanced dementia, DVT, insomnia, memory loss, neuropathy  Clinical Impression  Pt is able to stand partially and uses bed to steady himself for sidestepping.  Have asked for SNF and if not able to order should have assistance for all transitions at group home.  Will follow acutely to see how far independence with mobility can be achieved, and focus on standing and walking with RW.    Follow Up Recommendations SNF;Supervision for mobility/OOB    Equipment Recommendations  None recommended by PT    Recommendations for Other Services       Precautions / Restrictions Precautions Precautions: Fall (telemetry, pt trying to remove it when PT arrived) Restrictions Weight Bearing Restrictions: No      Mobility  Bed Mobility Overal bed mobility: Needs Assistance Bed Mobility: Supine to Sit;Sit to Supine     Supine to sit: Mod assist Sit to supine: Mod assist   General bed mobility comments: pt needs verbal and tactile cues for all mobility  Transfers Overall transfer level: Needs assistance Equipment used: Rolling walker (2 wheeled);1 person hand held assist Transfers: Sit to/from Stand Sit to Stand: Mod assist         General transfer comment: much help and cannot sequence without total cues  Ambulation/Gait Ambulation/Gait assistance: Mod assist Ambulation Distance (Feet): 3 Feet Assistive device: Rolling walker (2 wheeled);1 person hand held assist Gait Pattern/deviations: Step-to pattern;Wide base of support;Trunk flexed;Shuffle;Decreased stride length Gait velocity: reduced Gait velocity interpretation: Below normal speed for age/gender General Gait Details: sidesteps  bedside  Stairs            Wheelchair Mobility    Modified Rankin (Stroke Patients Only)       Balance Overall balance assessment: Needs assistance Sitting-balance support: Feet supported Sitting balance-Leahy Scale: Fair     Standing balance support: Bilateral upper extremity supported Standing balance-Leahy Scale: Poor                               Pertinent Vitals/Pain Pain Assessment: No/denies pain    Home Living Family/patient expects to be discharged to:: Group home                      Prior Function Level of Independence: Needs assistance   Gait / Transfers Assistance Needed: pt stated he used RW but not clear about PLOF  ADL's / Homemaking Assistance Needed: lived in group home        Hand Dominance        Extremity/Trunk Assessment   Upper Extremity Assessment Upper Extremity Assessment: Generalized weakness    Lower Extremity Assessment Lower Extremity Assessment: Generalized weakness    Cervical / Trunk Assessment Cervical / Trunk Assessment: Kyphotic  Communication   Communication: Other (comment) (dementia)  Cognition Arousal/Alertness: Awake/alert Behavior During Therapy: Flat affect;Impulsive Overall Cognitive Status: History of cognitive impairments - at baseline                      General Comments      Exercises     Assessment/Plan    PT Assessment Patient needs continued PT services  PT Problem List Decreased  strength;Decreased range of motion;Decreased activity tolerance;Decreased balance;Decreased mobility;Decreased coordination;Decreased cognition;Decreased knowledge of use of DME;Decreased safety awareness;Decreased knowledge of precautions;Cardiopulmonary status limiting activity;Decreased skin integrity          PT Treatment Interventions DME instruction;Gait training;Functional mobility training;Therapeutic activities;Therapeutic exercise;Balance training;Neuromuscular  re-education;Patient/family education    PT Goals (Current goals can be found in the Care Plan section)  Acute Rehab PT Goals Patient Stated Goal: to walk a longer trip PT Goal Formulation: With patient Time For Goal Achievement: 09/15/16 Potential to Achieve Goals: Good    Frequency Min 2X/week   Barriers to discharge  (not sure if staff can assist pt to stand and walk)      Co-evaluation               End of Session Equipment Utilized During Treatment: Gait belt Activity Tolerance: Patient tolerated treatment well;Patient limited by fatigue Patient left: in bed;with call bell/phone within reach;with bed alarm set Nurse Communication: Mobility status         Time: [id number] PT Time Calculation (min) (ACUTE ONLY): 23 min   Charges:   PT Evaluation $PT Eval Low Complexity: 1 Procedure PT Treatments $Therapeutic Activity: 8-22 mins   PT G Codes:        Ramond Dial 09/30/16, 3:45 PM  Mee Hives, PT MS Acute Rehab Dept. Number: [place]

## 2016-09-01 NOTE — Progress Notes (Signed)
[employer] at [place] NAME: Charles Nelson    MR#:  [id number]  DATE OF BIRTH:  22-Apr-1930  SUBJECTIVE:  CHIEF COMPLAINT:   Chief Complaint  Patient presents with  . Leg Swelling   -Admitted with DVT and PE  -Denies any complaints. Appears to be confused and oriented to self which is new according to family.  REVIEW OF SYSTEMS:  Review of Systems  Constitutional: Positive for malaise/fatigue. Negative for chills and fever.  HENT: Negative for ear discharge, ear pain and nosebleeds.   Eyes: Negative for blurred vision and double vision.  Respiratory: Negative for cough, shortness of breath and wheezing.   Cardiovascular: Positive for leg swelling. Negative for chest pain and palpitations.  Gastrointestinal: Negative for abdominal pain, constipation, diarrhea, nausea and vomiting.  Genitourinary: Negative for dysuria.  Neurological: Negative for dizziness, seizures and headaches.  Psychiatric/Behavioral:       Confused    DRUG ALLERGIES:  No Known Allergies  VITALS:  Blood pressure 124/64, pulse 76, temperature 98.3 F (36.8 C), temperature source Oral, resp. rate 16, height 6' (1.829 m), weight 77 kg (169 lb 11.2 oz), SpO2 96 %.  PHYSICAL EXAMINATION:  Physical Exam  GENERAL:  83 y.o.-year-old patient lying in the bed with no acute distress.  EYES: Pupils equal, round, reactive to light and accommodation. No scleral icterus. Extraocular muscles intact.  HEENT: Head atraumatic, normocephalic. Oropharynx and nasopharynx clear.  NECK:  Supple, no jugular venous distention. No thyroid enlargement, no tenderness.  LUNGS: Normal breath sounds bilaterally, no wheezing, rales,rhonchi or crepitation. No use of accessory muscles of respiration. Decreased bibasilar breath sounds noted. CARDIOVASCULAR: S1, S2 normal. No murmurs, rubs, or gallops.  ABDOMEN: Soft, nontender, nondistended. Bowel sounds present. No organomegaly or mass.    EXTREMITIES: No pedal edema, cyanosis, or clubbing. Much improved edema of lower extremities. NEUROLOGIC: Cranial nerves II through XII are intact. Muscle strength 5/5 in all extremities. Sensation intact. Gait not checked. Global weakness noted. PSYCHIATRIC: The patient is alert and oriented to self.  SKIN: No obvious rash, lesion, or ulcer.    LABORATORY PANEL:   CBC  Recent Labs Lab 09/01/16 0512  WBC 10.5  HGB 11.7*  HCT 34.8*  PLT 193   ------------------------------------------------------------------------------------------------------------------  Chemistries   Recent Labs Lab 09/01/16 0512  NA 139  K 3.3*  CL 103  CO2 28  GLUCOSE 94  BUN 16  CREATININE 0.88  CALCIUM 8.4*   ------------------------------------------------------------------------------------------------------------------  Cardiac Enzymes  Recent Labs Lab 08/31/16 1409  TROPONINI 0.03*   ------------------------------------------------------------------------------------------------------------------  RADIOLOGY:  Ct Angio Chest Pe W And/or Wo Contrast  Result Date: 08/31/2016 CLINICAL DATA:  Leg edema for 3 months. Prior DVT and Coumadin therapy EXAM: CT ANGIOGRAPHY CHEST WITH CONTRAST TECHNIQUE: Multidetector CT imaging of the chest was performed using the standard protocol during bolus administration of intravenous contrast. Multiplanar CT image reconstructions and MIPs were obtained to evaluate the vascular anatomy. CONTRAST:  75 mL Isovue COMPARISON:  Chest radiograph 08/31/2016 FINDINGS: Cardiovascular: Tubular filling defect fills and occludes the proximal RIGHT lower lobe pulmonary artery (image 51, series 4). This filling defect extends into the more distal segmental branches. Filling defect within the proximal RIGHT upper lobe pulmonary artery (image 32, series 4). Tubular filling defect within the lingular pulmonary artery (image 34, series 4) The RIGHT ventricular diameter to LEFT  ventricular diameter ratio is less than 1 indicating no RIGHT heart strain. Overall clot burden is moderate. Mediastinum/Nodes: No axillary  supraclavicular adenopathy. No mediastinal hilar adenopathy. No pericardial fluid Lungs/Pleura: Within the posterior RIGHT lower lobe there is peripheral airspace disease concerning for a pulmonary infarction. There is branching peripheral nodular pattern in the lateral RIGHT lower lobe and RIGHT middle lobe. Upper Abdomen: Limited view of the liver, kidneys, pancreas are unremarkable. Normal adrenal glands. Musculoskeletal: No aggressive osseous lesion. Review of the MIP images confirms the above findings. IMPRESSION: 1. Acute pulmonary embolism within the RIGHT lower lobe pulmonary artery, RIGHT upper lobe pulmonary artery and lingular pulmonary artery. 2. Overall clot burden is moderate with no evidence of RIGHT ventricular strain. 3. Probable pulmonary infarction in the RIGHT lower lobe. 4. Fine branching nodular pattern in the RIGHT lung suggests chronic infectious process. Critical Value/emergent results were called by telephone at the time of interpretation on 08/31/2016 at 2:12 pm to Dr. Corky Downs, who verbally acknowledged these results. Electronically Signed   By: Suzy Bouchard M.D.   On: 08/31/2016 14:16   US Venous Img Lower Bilateral  Result Date: 08/31/2016 CLINICAL DATA:  Bilateral lower extremity edema, left greater than right. Recent DVT of left lower extremity with previous demonstration of nonocclusive thrombus in the left common femoral vein. EXAM: BILATERAL LOWER EXTREMITY VENOUS DOPPLER ULTRASOUND TECHNIQUE: Gray-scale sonography with graded compression, as well as color Doppler and duplex ultrasound were performed to evaluate the lower extremity deep venous systems from the level of the common femoral vein and including the common femoral, femoral, profunda femoral, popliteal and calf veins including the posterior tibial, peroneal and gastrocnemius veins  when visible. The superficial great saphenous vein was also interrogated. Spectral Doppler was utilized to evaluate flow at rest and with distal augmentation maneuvers in the common femoral, femoral and popliteal veins. COMPARISON:  05/26/2016 FINDINGS: RIGHT LOWER EXTREMITY Common Femoral Vein: Nonocclusive thrombus identified. Saphenofemoral Junction: No evidence of thrombus. Normal compressibility and flow on color Doppler imaging. Profunda Femoral Vein: No evidence of thrombus. Normal compressibility and flow on color Doppler imaging. Femoral Vein: Nonocclusive thrombus which is nearly occlusive throughout the thigh. Popliteal Vein: Nonocclusive thrombus identified in the popliteal vein. Calf Veins: No evidence of thrombus. Normal compressibility and flow on color Doppler imaging. Superficial Great Saphenous Vein: No evidence of thrombus. Normal compressibility and flow on color Doppler imaging. Venous Reflux:  None. Other Findings:  No abnormal fluid collections. LEFT LOWER EXTREMITY Common Femoral Vein: Nonocclusive thrombus identified in the left common femoral vein. Saphenofemoral Junction: No evidence of thrombus. Normal compressibility and flow on color Doppler imaging. Profunda Femoral Vein: No evidence of thrombus. Normal compressibility and flow on color Doppler imaging. Femoral Vein: Nonocclusive thrombus identified in the left femoral vein throughout the thigh a which is nearly occlusive. Popliteal Vein: Nonocclusive thrombus identified in the popliteal vein. Calf Veins: No evidence of thrombus. Normal compressibility and flow on color Doppler imaging. Superficial Great Saphenous Vein: No evidence of thrombus. Normal compressibility and flow on color Doppler imaging. Venous Reflux:  None. Other Findings:  No abnormal fluid collections. IMPRESSION: Similar pattern of bilateral lower extremity DVT with nonocclusive thrombus in the common femoral veins, femoral veins and popliteal veins. Thrombus is  nearly occlusive at the level of bilateral femoral and popliteal veins. Electronically Signed   By: Aletta Edouard M.D.   On: 08/31/2016 12:43   Dg Chest Portable 1 View  Result Date: 08/31/2016 CLINICAL DATA:  Left leg edema for 3 months. EXAM: PORTABLE CHEST 1 VIEW COMPARISON:  None. FINDINGS: 1256 hours. Rightward rotation. Interstitial and reticular opacity right lung  base, as before. Similar but less prominent changes noted left lung base, also stable. No edema or focal airspace consolidation. No substantial pleural effusion. The cardio pericardial silhouette is enlarged. The visualized bony structures of the thorax are intact. Status post right shoulder replacement. Telemetry leads overlie the chest. IMPRESSION: Stable.  No acute cardiopulmonary findings. Electronically Signed   By: Misty Stanley M.D.   On: 08/31/2016 13:13    EKG:   Orders placed or performed during the hospital encounter of 08/31/16  . ED EKG  . ED EKG  . EKG 12-Lead  . EKG 12-Lead    ASSESSMENT AND PLAN:   83 year old male with past medical history of dementia, previous history of DVT, who presented to the hospital due to worsening left lower extremity swelling and noted to have bilateral DVT and pulmonary emboli.   1. DVT/pulmonary embolus- recurrent DVT. Was treated with Coumadin for 3 months recently. -Now with recurrence of DVT and PE warrants long-term treatment. -Currently on Lovenox-changed to oral anticoagulation with eliquis today.  -. Physical therapy consult is pending  2. Elevated Troponin - due to demand ischemia.  - Echo with no right heart strain. Normal ejection fraction.   3. Dementia - cont. Risperdal, Trazodone Some altered mental status noted and disorientation which is new. Monitor, likely delirium. If not improving or focal deficits noted, will do a CT head.  4. Hypokalemia-being replaced   Physical therapy consult is pending    All the records are reviewed and case discussed  with Care Management/Social Workerr. Management plans discussed with the patient, family and they are in agreement.  CODE STATUS: Full code  TOTAL TIME TAKING CARE OF THIS PATIENT: 38 minutes.   POSSIBLE D/C IN 1-2 DAYS, DEPENDING ON CLINICAL CONDITION.   Gladstone Lighter M.D on 09/01/2016 at 3:05 PM  Between 7am to 6pm - Pager - [phone]  After 6pm go to [email or web address] - password [email or web address] [place] [facility]  Office  [phone]  CC: Primary care physician; No PCP Per Patient

## 2016-09-01 NOTE — Clinical Social Work Note (Signed)
Patient is from [facility] and PT has been ordered to evaluate mobility and make recommendations. CSW awaiting PT recommendations at this time.  Shela Leff MSW,LCSW  [phone]

## 2016-09-01 NOTE — Discharge Instructions (Signed)
Information on my medicine - ELIQUIS (apixaban)  This medication education was reviewed with me or my healthcare representative as part of my discharge preparation.  The pharmacist that spoke with me during my hospital stay was:  Ramond Dial, [facility]  Why was Eliquis prescribed for you? Eliquis was prescribed to treat blood clots that may have been found in the veins of your legs (deep vein thrombosis) or in your lungs (pulmonary embolism) and to reduce the risk of them occurring again.  What do You need to know about Eliquis ? The starting dose is 10 mg (two 5 mg tablets) taken TWICE daily for the FIRST SEVEN (7) DAYS, then on the evening of 09/08/16  the dose is reduced to ONE 5 mg tablet taken TWICE daily.  Eliquis may be taken with or without food.   Try to take the dose about the same time in the morning and in the evening. If you have difficulty swallowing the tablet whole please discuss with your pharmacist how to take the medication safely.  Take Eliquis exactly as prescribed and DO NOT stop taking Eliquis without talking to the doctor who prescribed the medication.  Stopping may increase your risk of developing a new blood clot.  Refill your prescription before you run out.  After discharge, you should have regular check-up appointments with your healthcare provider that is prescribing your Eliquis.    What do you do if you miss a dose? If a dose of ELIQUIS is not taken at the scheduled time, take it as soon as possible on the same day and twice-daily administration should be resumed. The dose should not be doubled to make up for a missed dose.  Important Safety Information A possible side effect of Eliquis is bleeding. You should call your healthcare provider right away if you experience any of the following: ? Bleeding from an injury or your nose that does not stop. ? Unusual colored urine (red or dark brown) or unusual colored stools (red or black). ? Unusual bruising  for unknown reasons. ? A serious fall or if you hit your head (even if there is no bleeding).  Some medicines may interact with Eliquis and might increase your risk of bleeding or clotting while on Eliquis. To help avoid this, consult your healthcare provider or pharmacist prior to using any new prescription or non-prescription medications, including herbals, vitamins, non-steroidal anti-inflammatory drugs (NSAIDs) and supplements.  This website has more information on Eliquis (apixaban): http://[email or web address].com/eliquis/home

## 2016-09-02 LAB — BASIC METABOLIC PANEL
ANION GAP: 3 — AB (ref 5–15)
BUN: 19 mg/dL (ref 6–20)
CALCIUM: 8.2 mg/dL — AB (ref 8.9–10.3)
CO2: 29 mmol/L (ref 22–32)
Chloride: 106 mmol/L (ref 101–111)
Creatinine, Ser: 0.8 mg/dL (ref 0.61–1.24)
GLUCOSE: 103 mg/dL — AB (ref 65–99)
POTASSIUM: 3.9 mmol/L (ref 3.5–5.1)
Sodium: 138 mmol/L (ref 135–145)

## 2016-09-02 MED ORDER — APIXABAN 5 MG PO TABS: 10.0000 mg | ORAL_TABLET | Freq: Two times a day (BID) | ORAL | 0 refills | Status: DC

## 2016-09-02 MED ORDER — APIXABAN 5 MG PO TABS: 5.0000 mg | ORAL_TABLET | Freq: Two times a day (BID) | ORAL | 2 refills | Status: DC

## 2016-09-02 MED ORDER — FUROSEMIDE 20 MG PO TABS: 20.0000 mg | ORAL_TABLET | ORAL | 0 refills | Status: DC

## 2016-09-02 NOTE — Clinical Social Work Note (Signed)
Clinical Social Work Assessment  Patient Details  Name: Charles Nelson MRN: [id number] Date of Birth: Apr 16, 1930  Date of referral:  09/02/16               Reason for consult:  Facility Placement                Permission sought to share information with:    Permission granted to share information::     Name::        Agency::     Relationship::     Contact Information:     Housing/Transportation Living arrangements for the past 2 months:  Group Home Source of Information:  Other (Comment Required) (niece) Patient Interpreter Needed:  None Criminal Activity/Legal Involvement Pertinent to Current Situation/Hospitalization:  No - Comment as needed Significant Relationships:   (niece) Lives with:  Facility Resident Do you feel safe going back to the place where you live?  Yes Need for family participation in patient care:  Yes (Comment)  Care giving concerns:  Patient resides at [facility].   Social Worker assessment / plan:  PT has evaluated patient and recommending STR. Patient with confusion and is a poor historian. CSW contacted patient's niece: Charles Nelson: [phone] via phone. Charles Nelson stated that she is in agreement with rehab for patient. She states that she has been very pleased with the care that he receives at his group home because they have only 4 residents and 4 staff so patient is cared for well. She stated that she believes at this time that patient does require more care than they can provide at this time. Bedsearch initiated and pasrr is pending.  Employment status:  Disabled (Comment on whether or not currently receiving Disability) Insurance information:  Medicare PT Recommendations:  [place] / Referral to community resources:     Patient/Family's Response to care:  Patient's niece expressed appreciation for CSW assistance.  Patient/Family's Understanding of and Emotional Response to Diagnosis, Current Treatment, and Prognosis:  Patient's  niece is involved and vested in patient's care. She is in agreement with and understands the recommendation for STR.  Emotional Assessment Appearance:  Appears stated age Attitude/Demeanor/Rapport:   (pleasantly confused) Affect (typically observed):  Calm, Quiet Orientation:  Oriented to Self, Oriented to Place Alcohol / Substance use:  Not Applicable Psych involvement (Current and /or in the community):  No (Comment)  Discharge Needs  Concerns to be addressed:  Care Coordination Readmission within the last 30 days:  No Current discharge risk:  None Barriers to Discharge:  No Barriers Identified   Shela Leff, LCSW 09/02/2016, 2:22 PM

## 2016-09-02 NOTE — NC FL2 (Signed)
[place] LEVEL OF CARE SCREENING TOOL     IDENTIFICATION  Patient Name: Charles Nelson Birthdate: 1930/01/20 Sex: male Admission Date (Current Location): 08/31/2016  [place] and [place] Number:  Engineering geologist and Address:  [facility], [address] [place], [place] [address]      Provider Number: [id number]  Attending Physician Name and Address:  Gladstone Lighter, MD  Relative Name and Phone Number:       Current Level of Care: Hospital Recommended Level of Care: [place] Prior Approval Number:    Date Approved/Denied:   PASRR Number:    Discharge Plan: SNF    Current Diagnoses: Patient Active Problem List   Diagnosis Date Noted  . DVT (deep venous thrombosis) ([place]) 08/31/2016    Orientation RESPIRATION BLADDER Height & Weight     Self, Place  Normal Continent Weight: 169 lb 11.2 oz (77 kg) Height:  6' (182.9 cm)  BEHAVIORAL SYMPTOMS/MOOD NEUROLOGICAL BOWEL NUTRITION STATUS   (none)  (none) Continent Diet (regular)  AMBULATORY STATUS COMMUNICATION OF NEEDS Skin   Extensive Assist Verbally Normal                       Personal Care Assistance Level of Assistance  Bathing, Dressing Bathing Assistance: Maximum assistance   Dressing Assistance: Maximum assistance     Functional Limitations Info   (none)          SPECIAL CARE FACTORS FREQUENCY  PT (By licensed PT)                    Contractures Contractures Info: Not present    Additional Factors Info  Code Status, Allergies Code Status Info: full Allergies Info: nka           Current Medications (09/02/2016):  This is the current hospital active medication list Current Facility-Administered Medications  Medication Dose Route Frequency Provider Last Rate Last Dose  . acetaminophen (TYLENOL) tablet 650 mg  650 mg Oral Q6H PRN Henreitta Leber, MD       Or  . acetaminophen (TYLENOL) suppository 650 mg  650 mg Rectal Q6H  PRN Henreitta Leber, MD      . apixaban (ELIQUIS) tablet 10 mg  10 mg Oral BID Gladstone Lighter, MD   10 mg at 09/02/16 0955   Followed by  . [START ON 09/08/2016] apixaban (ELIQUIS) tablet 5 mg  5 mg Oral BID Gladstone Lighter, MD      . cholecalciferol (VITAMIN D) tablet 2,000 Units  2,000 Units Oral Daily Henreitta Leber, MD   2,000 Units at 09/02/16 0954  . docusate sodium (COLACE) capsule 100 mg  100 mg Oral BID PRN Henreitta Leber, MD   100 mg at 09/01/16 1522  . Melatonin TABS 10 mg  10 mg Oral QHS Henreitta Leber, MD   10 mg at 09/01/16 2232  . ondansetron (ZOFRAN) tablet 4 mg  4 mg Oral Q6H PRN Henreitta Leber, MD       Or  . ondansetron (ZOFRAN) injection 4 mg  4 mg Intravenous Q6H PRN Henreitta Leber, MD      . risperiDONE (RISPERDAL) tablet 2 mg  2 mg Oral QHS Henreitta Leber, MD   2 mg at 09/01/16 2231  . traZODone (DESYREL) tablet 100 mg  100 mg Oral QHS Henreitta Leber, MD   100 mg at 09/01/16 2231  . vitamin B-12 (CYANOCOBALAMIN) tablet 2,000  mcg  2,000 mcg Oral Daily Henreitta Leber, MD   2,000 mcg at 09/02/16 [id number]     Discharge Medications: Please see discharge summary for a list of discharge medications.  Relevant Imaging Results:  Relevant Lab Results:   Additional Information ss: [id number]  Shela Leff, LCSW

## 2016-09-02 NOTE — Progress Notes (Signed)
[facility] [place] NAME: Charles Nelson    MR#:  [id number]  DATE OF BIRTH:  [birth year]  SUBJECTIVE:  CHIEF COMPLAINT:   Chief Complaint  Patient presents with  . Leg Swelling   -seems to be confused, but likely baseline with underlying dementia - improving left leg swelling - awaiting rehab placement.  REVIEW OF SYSTEMS:  Review of Systems  Constitutional: Positive for malaise/fatigue. Negative for chills and fever.  HENT: Negative for ear discharge, ear pain and nosebleeds.   Eyes: Negative for blurred vision and double vision.  Respiratory: Negative for cough, shortness of breath and wheezing.   Cardiovascular: Positive for leg swelling. Negative for chest pain and palpitations.  Gastrointestinal: Negative for abdominal pain, constipation, diarrhea, nausea and vomiting.  Genitourinary: Negative for dysuria.  Neurological: Negative for dizziness, seizures and headaches.  Psychiatric/Behavioral:       Confused    DRUG ALLERGIES:  No Known Allergies  VITALS:  Blood pressure 131/69, pulse 70, temperature 97.5 F (36.4 C), temperature source Oral, resp. rate 20, height 6' (1.829 m), weight 77 kg (169 lb 11.2 oz), SpO2 97 %.  PHYSICAL EXAMINATION:  Physical Exam  GENERAL:  83 y.o.-year-old patient lying in the bed with no acute distress.  EYES: Pupils equal, round, reactive to light and accommodation. No scleral icterus. Extraocular muscles intact.  HEENT: Head atraumatic, normocephalic. Oropharynx and nasopharynx clear.  NECK:  Supple, no jugular venous distention. No thyroid enlargement, no tenderness.  LUNGS: Normal breath sounds bilaterally, no wheezing, rales,rhonchi or crepitation. No use of accessory muscles of respiration. Decreased bibasilar breath sounds noted. CARDIOVASCULAR: S1, S2 normal. No murmurs, rubs, or gallops.  ABDOMEN: Soft, nontender, nondistended. Bowel sounds present. No organomegaly or mass.    EXTREMITIES: No pedal edema, cyanosis, or clubbing. Much improved edema of lower extremities. NEUROLOGIC: Cranial nerves II through XII are intact. Muscle strength 5/5 in all extremities. Sensation intact. Gait not checked. Global weakness noted. PSYCHIATRIC: The patient is alert and oriented to self.  SKIN: No obvious rash, lesion, or ulcer.    LABORATORY PANEL:   CBC  Recent Labs Lab 09/01/16 0512  WBC 10.5  HGB 11.7*  HCT 34.8*  PLT 193   ------------------------------------------------------------------------------------------------------------------  Chemistries   Recent Labs Lab 09/02/16 0459  NA 138  K 3.9  CL 106  CO2 29  GLUCOSE 103*  BUN 19  CREATININE 0.80  CALCIUM 8.2*   ------------------------------------------------------------------------------------------------------------------  Cardiac Enzymes  Recent Labs Lab 08/31/16 1409  TROPONINI 0.03*   ------------------------------------------------------------------------------------------------------------------  RADIOLOGY:  Ct Angio Chest Pe W And/or Wo Contrast  Result Date: 08/31/2016 CLINICAL DATA:  Leg edema for 3 months. Prior DVT and Coumadin therapy EXAM: CT ANGIOGRAPHY CHEST WITH CONTRAST TECHNIQUE: Multidetector CT imaging of the chest was performed using the standard protocol during bolus administration of intravenous contrast. Multiplanar CT image reconstructions and MIPs were obtained to evaluate the vascular anatomy. CONTRAST:  75 mL Isovue COMPARISON:  Chest radiograph 08/31/2016 FINDINGS: Cardiovascular: Tubular filling defect fills and occludes the proximal RIGHT lower lobe pulmonary artery (image 51, series 4). This filling defect extends into the more distal segmental branches. Filling defect within the proximal RIGHT upper lobe pulmonary artery (image 32, series 4). Tubular filling defect within the lingular pulmonary artery (image 34, series 4) The RIGHT ventricular diameter to LEFT  ventricular diameter ratio is less than 1 indicating no RIGHT heart strain. Overall clot burden is moderate. Mediastinum/Nodes: No axillary supraclavicular adenopathy. No mediastinal  hilar adenopathy. No pericardial fluid Lungs/Pleura: Within the posterior RIGHT lower lobe there is peripheral airspace disease concerning for a pulmonary infarction. There is branching peripheral nodular pattern in the lateral RIGHT lower lobe and RIGHT middle lobe. Upper Abdomen: Limited view of the liver, kidneys, pancreas are unremarkable. Normal adrenal glands. Musculoskeletal: No aggressive osseous lesion. Review of the MIP images confirms the above findings. IMPRESSION: 1. Acute pulmonary embolism within the RIGHT lower lobe pulmonary artery, RIGHT upper lobe pulmonary artery and lingular pulmonary artery. 2. Overall clot burden is moderate with no evidence of RIGHT ventricular strain. 3. Probable pulmonary infarction in the RIGHT lower lobe. 4. Fine branching nodular pattern in the RIGHT lung suggests chronic infectious process. Critical Value/emergent results were called by telephone at the time of interpretation on 08/31/2016 at 2:12 pm to Dr. Corky Downs, who verbally acknowledged these results. Electronically Signed   By: Suzy Bouchard M.D.   On: 08/31/2016 14:16    EKG:   Orders placed or performed during the hospital encounter of 08/31/16  . ED EKG  . ED EKG  . EKG 12-Lead  . EKG 12-Lead    ASSESSMENT AND PLAN:   83 year old male with past medical history of dementia, previous history of DVT, who presented to the hospital due to worsening left lower extremity swelling and noted to have bilateral DVT and pulmonary emboli.   1. DVT/pulmonary embolus- recurrent DVT. Was treated with Coumadin for 3 months recently. -Now with recurrence of DVT and PE warrants long-term treatment. -was on Lovenox-changed to oral anticoagulation with eliquis.  -. Physical therapy consulted and recommended SNF  2. Elevated  Troponin - due to demand ischemia.  - Echo with no right heart strain. Normal ejection fraction.   3. Dementia - cont. Risperdal, Trazodone At baseline, intermittent confusion and oriented to self.  4. Hypokalemia-replaced   Physical therapy consulted ad recommended SNF Left voice mail to his niece.    All the records are reviewed and case discussed with Care Management/Social Workerr. Management plans discussed with the patient, family and they are in agreement.  CODE STATUS: Full code  TOTAL TIME TAKING CARE OF THIS PATIENT: 36 minutes.   POSSIBLE D/C TODAY or tomorrow, DEPENDING ON CLINICAL CONDITION.   Gladstone Lighter M.D on 09/02/2016 at 2:01 PM  Between 7am to 6pm - Pager - [phone]  After 6pm go to [email or web address] - password [email or web address] [facility]  Office  [phone]  CC: Primary care physician; No PCP Per Patient

## 2016-09-02 NOTE — Progress Notes (Signed)
Bed search started- possible discharge tomorrow

## 2016-09-03 NOTE — Clinical Social Work Note (Signed)
Patient's niece: April is aware of bed offers and has accepted [facility]. Patient to discharge today. Discharge information sent.  Shela Leff MSW,LCSW [phone]

## 2016-09-03 NOTE — Progress Notes (Signed)
Patient is picked up by EMS to be transported to [facility], IV is discontinued

## 2016-09-03 NOTE — Progress Notes (Signed)
Called [facility] at [phone], gave report to [facility] LPN

## 2016-09-03 NOTE — Progress Notes (Signed)
ANTICOAGULATION CONSULT NOTE - Initial Consult  Pharmacy Consult for apixaban Indication: DVT & Bilateral PE  No Known Allergies  Patient Measurements: Height: 6' (182.9 cm) Weight: 169 lb 11.2 oz (77 kg) IBW/kg (Calculated) : 77.6  Vital Signs: Temp: 97.5 F (36.4 C) (01/05 0813) Temp Source: Oral (01/05 0813) BP: 129/71 (01/05 0813) Pulse Rate: 63 (01/05 0813)  Labs:  Recent Labs  08/31/16 1106 08/31/16 1409 09/01/16 0512 09/02/16 0459  HGB 13.0  --  11.7*  --   HCT 38.2*  --  34.8*  --   PLT 214  --  193  --   CREATININE 0.98  --  0.88 0.80  TROPONINI 0.03* 0.03*  --   --     Estimated Creatinine Clearance: 74.9 mL/min (by C-G formula based on SCr of 0.8 mg/dL).   Medical History: Past Medical History:  Diagnosis Date  . DVT (deep venous thrombosis) ([place])   . Insomnia   . Memory loss   . Neuropathy ([place])     Assessment: Patient with DVT and bilateral PE on apixaban. History of PE and DVT, treated with 3 months of coumadin  Plan:  Continue apixaban 10mg  PO BID x 7 days followed by 5mg  PO BID to start on 1/10. Will require long term anticoagulation due to recurrence.   Jalil Lorusso C 09/03/2016,9:18 AM

## 2016-09-03 NOTE — Discharge Summary (Signed)
[facility] [place] NAME: Charles Nelson    MR#:  [id number]  DATE OF BIRTH:  1930/07/11  DATE OF ADMISSION:  08/31/2016 ADMITTING PHYSICIAN: Henreitta Leber, MD  DATE OF DISCHARGE: 09/04/15  PRIMARY CARE PHYSICIAN: No PCP Per Patient    ADMISSION DIAGNOSIS:  Pulmonary infarction ([place]) [I26.99] Other acute pulmonary embolism without acute cor pulmonale (HCC) [I26.99] Acute deep vein thrombosis (DVT) of both lower extremities, unspecified vein ([place]) [I82.403]  DISCHARGE DIAGNOSIS:  Recurrent DVT and PE Dementia SECONDARY DIAGNOSIS:   Past Medical History:  Diagnosis Date  . DVT (deep venous thrombosis) ([place])   . Insomnia   . Memory loss   . Neuropathy [facility])     HOSPITAL COURSE:   83 year old male with past medical history of dementia, previous history of DVT, who presented to the hospital due to worsening left lower extremity swelling and noted to have bilateral DVT and pulmonary emboli.   1. DVT/pulmonary embolus- recurrent DVT. Was treated with Coumadin for 3 months recently. -Now with recurrence of DVT and PE warrants long-term treatment. -was on Lovenox-changed to oral anticoagulation with eliquis.  - Physical therapy consulted and recommended SNF  2. Elevated Troponin - due to demand ischemia.  - Echo with no right heart strain. Normal ejection fraction.   3. Dementia - cont. Risperdal, Trazodone At baseline, intermittent confusion and oriented to self.  4. Hypokalemia-replaced  Overall stable D/c rehab   CONSULTS OBTAINED:    DRUG ALLERGIES:  No Known Allergies  DISCHARGE MEDICATIONS:   Current Discharge Medication List    START taking these medications   Details  !! apixaban (ELIQUIS) 5 MG TABS tablet Take 2 tablets (10 mg total) by mouth 2 (two) times daily. X 6 more days- followed by 1 tab oral twice a day Qty: 24 tablet, Refills: 0    !! apixaban (ELIQUIS) 5 MG TABS tablet Take 1 tablet  (5 mg total) by mouth 2 (two) times daily. To be started after the initial 6 days of 2 tabs twice a day dosing. Qty: 60 tablet, Refills: 2     !! - Potential duplicate medications found. Please discuss with provider.    CONTINUE these medications which have CHANGED   Details  furosemide (LASIX) 20 MG tablet Take 1 tablet (20 mg total) by mouth every other day. Qty: 30 tablet, Refills: 0      CONTINUE these medications which have NOT CHANGED   Details  docusate sodium (COLACE) 100 MG capsule Take 100 mg by mouth 2 (two) times daily as needed for mild constipation.    Ergocalciferol (VITAMIN D2) 2000 units TABS Take 2,000 Units by mouth daily.    Melatonin 10 MG TABS Take 10 mg by mouth at bedtime.    risperiDONE (RISPERDAL) 2 MG tablet Take 2 mg by mouth at bedtime.    traZODone (DESYREL) 100 MG tablet Take 100 mg by mouth at bedtime.    vitamin B-12 (CYANOCOBALAMIN) 1000 MCG tablet Take 2,000 mcg by mouth daily.        If you experience worsening of your admission symptoms, develop shortness of breath, life threatening emergency, suicidal or homicidal thoughts you must seek medical attention immediately by calling 911 or calling your MD immediately  if symptoms less severe.  You Must read complete instructions/literature along with all the possible adverse reactions/side effects for all the Medicines you take and that have been prescribed to you. Take any new Medicines after you have  completely understood and accept all the possible adverse reactions/side effects.   Please note  You were cared for by a hospitalist during your hospital stay. If you have any questions about your discharge medications or the care you received while you were in the hospital after you are discharged, you can call the unit and asked to speak with the hospitalist on call if the hospitalist that took care of you is not available. Once you are discharged, your primary care physician will handle any further  medical issues. Please note that NO REFILLS for any discharge medications will be authorized once you are discharged, as it is imperative that you return to your primary care physician (or establish a relationship with a primary care physician if you do not have one) for your aftercare needs so that they can reassess your need for medications and monitor your lab values. Today   SUBJECTIVE   No new complaints. Pleasantly confused  VITAL SIGNS:  Blood pressure 129/71, pulse 63, temperature 97.5 F (36.4 C), temperature source Oral, resp. rate 20, height 6' (1.829 m), weight 77 kg (169 lb 11.2 oz), SpO2 96 %.  I/O:   Intake/Output Summary (Last 24 hours) at 09/03/16 1116 Last data filed at 09/03/16 1008  Gross per 24 hour  Intake              800 ml  Output                0 ml  Net              800 ml    PHYSICAL EXAMINATION:  GENERAL:  83 y.o.-year-old patient lying in the bed with no acute distress.  EYES: Pupils equal, round, reactive to light and accommodation. No scleral icterus. Extraocular muscles intact.  HEENT: Head atraumatic, normocephalic. Oropharynx and nasopharynx clear.  NECK:  Supple, no jugular venous distention. No thyroid enlargement, no tenderness.  LUNGS: Normal breath sounds bilaterally, no wheezing, rales,rhonchi or crepitation. No use of accessory muscles of respiration.  CARDIOVASCULAR: S1, S2 normal. No murmurs, rubs, or gallops.  ABDOMEN: Soft, non-tender, non-distended. Bowel sounds present. No organomegaly or mass.  EXTREMITIES: No pedal edema, cyanosis, or clubbing.  NEUROLOGIC: Cranial nerves II through XII are intact. Muscle strength 5/5 in all extremities. Sensation intact. Gait not checked.  PSYCHIATRIC: The patient is alert and oriented x 3.  SKIN: No obvious rash, lesion, or ulcer.   DATA REVIEW:   CBC   Recent Labs Lab 09/01/16 0512  WBC 10.5  HGB 11.7*  HCT 34.8*  PLT 193    Chemistries   Recent Labs Lab 09/02/16 0459  NA 138  K  3.9  CL 106  CO2 29  GLUCOSE 103*  BUN 19  CREATININE 0.80  CALCIUM 8.2*    Microbiology Results   No results found for this or any previous visit (from the past 240 hour(s)).  RADIOLOGY:  No results found.   Management plans discussed with the patient, family and they are in agreement.  CODE STATUS:     Code Status Orders        Start     Ordered   08/31/16 1903  Full code  Continuous     08/31/16 1902    Code Status History    Date Active Date Inactive Code Status Order ID Comments User Context   This patient has a current code status but no historical code status.    Advance Directive Documentation   Flowsheet Row  Most Recent Value  Type of Advance Directive  Healthcare Power of Attorney  Pre-existing out of facility DNR order (yellow form or pink MOST form)  No data  "MOST" Form in Place?  No data      TOTAL TIME TAKING CARE OF THIS PATIENT: 40 minutes.    Kasch Borquez M.D on 09/03/2016 at 11:16 AM  Between 7am to 6pm - Pager - [phone] After 6pm go to [email or web address] - password [email or web address] [facility]  [facility]  Office  [phone]  CC: Primary care physician; No PCP Per Patient

## 2016-09-03 NOTE — Clinical Social Work Placement (Signed)
   CLINICAL SOCIAL WORK PLACEMENT  NOTE  Date:  09/03/2016  Patient Details  Name: Charles Nelson MRN: [id number] Date of Birth: [birth year]  Clinical Social Work is seeking post-discharge placement for this patient at the [facility] level of care (*CSW will initial, date and re-position this form in  chart as items are completed):  Yes   Patient/family provided with [facility] Work Department's list of facilities offering this level of care within the geographic area requested by the patient (or if unable, by the patient's family).  Yes   Patient/family informed of their freedom to choose among providers that offer the needed level of care, that participate in Medicare, Medicaid or managed care program needed by the patient, have an available bed and are willing to accept the patient.  Yes   Patient/family informed of [facility]'s ownership interest in [facility] and [facility], as well as of the fact that they are under no obligation to receive care at these facilities.  PASRR submitted to EDS on 09/03/16     PASRR number received on 09/03/16     Existing PASRR number confirmed on       FL2 transmitted to all facilities in geographic area requested by pt/family on 09/03/16     FL2 transmitted to all facilities within larger geographic area on       Patient informed that his/her managed care company has contracts with or will negotiate with certain facilities, including the following:        Yes   Patient/family informed of bed offers received.  Patient chooses bed at  [facility])     Physician recommends and patient chooses bed at  [facility])    Patient to be transferred to  ([facility]) on 09/03/16.  Patient to be transferred to facility by  (EMS)     Patient family notified on 09/03/16 of transfer.  Name of family member notified:  Shela Leff MSW,LCSW DP     PHYSICIAN       Additional Comment:     _______________________________________________ Shela Leff, LCSW 09/03/2016, 11:43 AM

## 2016-09-03 NOTE — Care Management Important Message (Signed)
Important Message  Patient Details  Name: Charles Nelson MRN: [id number] Date of Birth: [birth year]   Medicare Important Message Given:  Yes    Beverly Sessions, RN 09/03/2016, 12:47 PM

## 2017-01-31 ENCOUNTER — Encounter: Payer: Self-pay | Admitting: *Deleted

## 2017-01-31 ENCOUNTER — Emergency Department: Payer: Medicare Other

## 2017-01-31 ENCOUNTER — Emergency Department
Admission: EM | Admit: 2017-01-31 | Discharge: 2017-01-31 | Disposition: A | Discharge status: Home / Self Care | Payer: Medicare Other | Attending: Student in an Organized Health Care Education/Training Program | Admitting: Student in an Organized Health Care Education/Training Program

## 2017-01-31 DIAGNOSIS — Y939 Activity, unspecified: Secondary | ICD-10-CM | POA: Diagnosis not present

## 2017-01-31 DIAGNOSIS — Y929 Unspecified place or not applicable: Secondary | ICD-10-CM | POA: Diagnosis not present

## 2017-01-31 DIAGNOSIS — W19XXXA Unspecified fall, initial encounter: Secondary | ICD-10-CM

## 2017-01-31 DIAGNOSIS — W0110XA Fall on same level from slipping, tripping and stumbling with subsequent striking against unspecified object, initial encounter: Secondary | ICD-10-CM | POA: Insufficient documentation

## 2017-01-31 DIAGNOSIS — S0990XA Unspecified injury of head, initial encounter: Secondary | ICD-10-CM | POA: Diagnosis not present

## 2017-01-31 DIAGNOSIS — Y999 Unspecified external cause status: Secondary | ICD-10-CM | POA: Insufficient documentation

## 2017-01-31 LAB — BASIC METABOLIC PANEL
ANION GAP: 6 (ref 5–15)
BUN: 16 mg/dL (ref 6–20)
CALCIUM: 8.9 mg/dL (ref 8.9–10.3)
CHLORIDE: 102 mmol/L (ref 101–111)
CO2: 30 mmol/L (ref 22–32)
Creatinine, Ser: 0.73 mg/dL (ref 0.61–1.24)
GFR calc non Af Amer: >60 mL/min (ref >60)
Glucose, Bld: 94 mg/dL (ref 65–99)
POTASSIUM: 3.5 mmol/L (ref 3.5–5.1)
SODIUM: 138 mmol/L (ref 135–145)

## 2017-01-31 NOTE — ED Notes (Addendum)
Attempted to call group home [facility] care services with no answer x3 - attempting to verify where pt came from prior to calling ems to discharge - pt is unable to tell this nurse where he lives

## 2017-01-31 NOTE — ED Notes (Signed)
[facility] and they confirm that they called a cab for pt - they are going to call and follow up with them

## 2017-01-31 NOTE — ED Provider Notes (Signed)
[facility] Emergency Department Provider Note    First MD Initiated Contact with Patient 01/31/17 1100     (approximate)  I have reviewed the triage vital signs and the nursing notes.   HISTORY  Chief Complaint Fall    HPI Charles Nelson is a 83 y.o. male who presents with concern with mechanical fall that occurred earlier this morning. Patient states that he was getting close and lost his balance and stepping back to get checked up on pants on the floor. He did fall back and hit his head. Denies any loss of consciousness. States she otherwise feels well. The patient is on Eliquis for history of PE. Denies any hip pain and back pain shoulder pain or pain of his extremities. He denies any shortness of breath or chest pain.   Past Medical History:  Diagnosis Date  . DVT (deep venous thrombosis) ([place])   . Insomnia   . Memory loss   . Neuropathy    Family History  Problem Relation Age of Onset  . Diabetes Neg Hx   . Hypertension Neg Hx   . Deep vein thrombosis Neg Hx    Past Surgical History:  Procedure Laterality Date  . pt does not remember surgery hx     Patient Active Problem List   Diagnosis Date Noted  . DVT (deep venous thrombosis) ([place]) 08/31/2016      Prior to Admission medications   Medication Sig Start Date End Date Taking? Authorizing Provider  apixaban (ELIQUIS) 5 MG TABS tablet Take 2 tablets (10 mg total) by mouth 2 (two) times daily. X 6 more days- followed by 1 tab oral twice a day 09/02/16   Gladstone Lighter, MD  apixaban (ELIQUIS) 5 MG TABS tablet Take 1 tablet (5 mg total) by mouth 2 (two) times daily. To be started after the initial 6 days of 2 tabs twice a day dosing. 09/08/16   Gladstone Lighter, MD  docusate sodium (COLACE) 100 MG capsule Take 100 mg by mouth 2 (two) times daily as needed for mild constipation.    [provider]  Ergocalciferol (VITAMIN D2) 2000 units TABS Take 2,000 Units by mouth daily.     [provider]  furosemide (LASIX) 20 MG tablet Take 1 tablet (20 mg total) by mouth every other day. 09/02/16 09/02/17  Gladstone Lighter, MD  Melatonin 10 MG TABS Take 10 mg by mouth at bedtime.    [provider]  risperiDONE (RISPERDAL) 2 MG tablet Take 2 mg by mouth at bedtime.    [provider]  traZODone (DESYREL) 100 MG tablet Take 100 mg by mouth at bedtime.    [provider]  vitamin B-12 (CYANOCOBALAMIN) 1000 MCG tablet Take 2,000 mcg by mouth daily.    [provider]    Allergies Patient has no known allergies.    Social History Social History  Substance Use Topics  . Smoking status: Never Smoker  . Smokeless tobacco: Never Used  . Alcohol use No    Review of Systems Patient denies headaches, rhinorrhea, blurry vision, numbness, shortness of breath, chest pain, edema, cough, abdominal pain, nausea, vomiting, diarrhea, dysuria, fevers, rashes or hallucinations unless otherwise stated above in HPI. ____________________________________________   PHYSICAL EXAM:  VITAL SIGNS: Vitals:   01/31/17 1027 01/31/17 1030  BP: (!) 149/83 (!) 146/70  Pulse: 64 64  Resp: 16   Temp: 98.7 F (37.1 C)     Constitutional: Alert and oriented.  in no acute distress.  Eyes: Conjunctivae are normal.  Head: small contusion to left parietal scalp, no laceration or abrasion Nose: No congestion/rhinnorhea. Mouth/Throat: Mucous membranes are moist.   Neck: No stridor. Painless ROM.  Cardiovascular: Normal rate, regular rhythm. Grossly normal heart sounds.  Good peripheral circulation. Respiratory: Normal respiratory effort.  No retractions. Lungs CTAB. Gastrointestinal: Soft and nontender. No distention. No abdominal bruits. No CVA tenderness. Musculoskeletal: No lower extremity tenderness nor edema.  No joint effusions. Neurologic:  Normal speech and language. No gross focal neurologic deficits are appreciated. No facial droop Skin:  Skin  is warm, dry and intact. No rash noted. Psychiatric: Mood and affect are normal. Speech and behavior are normal.  ____________________________________________   LABS (all labs ordered are listed, but only abnormal results are displayed)  Results for orders placed or performed during the hospital encounter of 01/31/17 (from the past 24 hour(s))  Basic metabolic panel     Status: None   Collection Time: 01/31/17 11:47 AM  Result Value Ref Range   Sodium 138 135 - 145 mmol/L   Potassium 3.5 3.5 - 5.1 mmol/L   Chloride 102 101 - 111 mmol/L   CO2 30 22 - 32 mmol/L   Glucose, Bld 94 65 - 99 mg/dL   BUN 16 6 - 20 mg/dL   Creatinine, Ser 0.73 0.61 - 1.24 mg/dL   Calcium 8.9 8.9 - 10.3 mg/dL   GFR calc non Af Amer >60 >60 mL/min   GFR calc Af Amer >60 >60 mL/min   Anion gap 6 5 - 15   ____________________________________________ ____________________________________________  RADIOLOGY  I personally reviewed all radiographic images ordered to evaluate for the above acute complaints and reviewed radiology reports and findings.  These findings were personally discussed with the patient.  Please see medical record for radiology report.  ____________________________________________   PROCEDURES  Procedure(s) performed:  Procedures    Critical Care performed: no ____________________________________________   INITIAL IMPRESSION / ASSESSMENT AND PLAN / ED COURSE  Pertinent labs & imaging results that were available during my care of the patient were reviewed by me and considered in my medical decision making (see chart for details).  DDX: contusion, ich, concussion  Loyd Marhefka is a 83 y.o. who presents to the ED with mechanical fall and head injury as described above. Patient is hemodynamic stable and well-appearing. He has no other complaints at this time. CT imaging will be ordered as he is on blood thinners.    ----------------------------------------- 12:32 PM on  01/31/2017 -----------------------------------------  CT imaging shows no evidence of acute intracranial abnormality. Blood work is reassuring. Patient stable and in no acute distress. Neuro exam is nonfocal. Patient stable for follow-up as an outpatient.  ____________________________________________   FINAL CLINICAL IMPRESSION(S) / ED DIAGNOSES  Final diagnoses:  Fall, initial encounter  Minor head injury, initial encounter      NEW MEDICATIONS STARTED DURING THIS VISIT:  New Prescriptions   No medications on file     Note:  This document was prepared using Dragon voice recognition software and may include unintentional dictation errors.    Merlyn Lot, MD 01/31/17 [phone]

## 2017-01-31 NOTE — ED Triage Notes (Signed)
Pt arrives via EMS from a group home, states this AM he was up looking for clothes and, states he then tripped and fell backwards hitting his head, pt on eliquis, denies any LOC, states he got right back up, denies any pain at present, awake and alert

## 2017-04-02 ENCOUNTER — Emergency Department: Payer: Medicare Other

## 2017-04-02 ENCOUNTER — Emergency Department
Admission: EM | Admit: 2017-04-02 | Discharge: 2017-04-02 | Disposition: A | Discharge status: Home Health | Payer: Medicare Other | Attending: Emergency Medicine | Admitting: Emergency Medicine

## 2017-04-02 DIAGNOSIS — N390 Urinary tract infection, site not specified: Secondary | ICD-10-CM | POA: Insufficient documentation

## 2017-04-02 DIAGNOSIS — R531 Weakness: Secondary | ICD-10-CM | POA: Diagnosis not present

## 2017-04-02 LAB — BASIC METABOLIC PANEL
Anion gap: 8 (ref 5–15)
BUN: 20 mg/dL (ref 6–20)
CO2: 28 mmol/L (ref 22–32)
CREATININE: 0.97 mg/dL (ref 0.61–1.24)
Calcium: 9 mg/dL (ref 8.9–10.3)
Chloride: 104 mmol/L (ref 101–111)
GFR calc non Af Amer: >60 mL/min (ref >60)
Glucose, Bld: 100 mg/dL — ABNORMAL HIGH (ref 65–99)
Potassium: 3.3 mmol/L — ABNORMAL LOW (ref 3.5–5.1)
Sodium: 140 mmol/L (ref 135–145)

## 2017-04-02 LAB — CBC
HCT: 39.6 % — ABNORMAL LOW (ref 40.0–52.0)
Hemoglobin: 13.1 g/dL (ref 13.0–18.0)
MCH: 30.5 pg (ref 26.0–34.0)
MCHC: 33.2 g/dL (ref 32.0–36.0)
MCV: 91.7 fL (ref 80.0–100.0)
PLATELETS: 222 10*3/uL (ref 150–440)
RBC: 4.32 MIL/uL — AB (ref 4.40–5.90)
RDW: 14.2 % (ref 11.5–14.5)
WBC: 11.4 10*3/uL — ABNORMAL HIGH (ref 3.8–10.6)

## 2017-04-02 LAB — URINALYSIS, COMPLETE (UACMP) WITH MICROSCOPIC
Bilirubin Urine: NEGATIVE
GLUCOSE, UA: NEGATIVE mg/dL
HGB URINE DIPSTICK: NEGATIVE
Ketones, ur: NEGATIVE mg/dL
Nitrite: NEGATIVE
PH: 5 (ref 5.0–8.0)
Protein, ur: NEGATIVE mg/dL
SPECIFIC GRAVITY, URINE: 1.017 (ref 1.005–1.030)
Squamous Epithelial / LPF: NONE SEEN

## 2017-04-02 LAB — TROPONIN I: Troponin I: 0.03 ng/mL (ref <0.03)

## 2017-04-02 MED ORDER — DEXTROSE 5 % IV SOLN
1.0000 g | Freq: Once | INTRAVENOUS | Status: AC
  Administered 2017-04-02: 1 g via INTRAVENOUS
  Filled 2017-04-02: qty 10

## 2017-04-02 MED ORDER — CEPHALEXIN 250 MG PO CAPS: 250.0000 mg | ORAL_CAPSULE | Freq: Four times a day (QID) | ORAL | 0 refills | Status: AC

## 2017-04-02 NOTE — ED Triage Notes (Signed)
Per EMS pt comes from [facility].  The staff at the group home states that pt is falling a lot recently.  They state that when the pt gets up he shakes and is more unsteady.  Pt has a skin tear on right arm from a previous fall.  Pt is A&Ox1.  Pt has swollen feet and ankles but this is his baseline.

## 2017-04-02 NOTE — ED Notes (Addendum)
Fall posey mat placed under pt, yellow arm band for fall risk placed on pt. 3 fall mats placed around bed due to fall risk assessment. strecher placed in low position with siderails up x2. Call bell placed at right side.

## 2017-04-02 NOTE — ED Notes (Signed)
Attempted to phone [facility] x2 without success. No answer at [phone]

## 2017-04-02 NOTE — ED Notes (Signed)
Attempted to call [facility] regarding discharge and to see if they will come pick pt up with no answer.  Dr. Jimmye Norman also called and left message regarding pt's care.

## 2017-04-02 NOTE — ED Provider Notes (Signed)
[facility] Emergency Department Provider Note       Time seen: ----------------------------------------- 4:05 PM on 04/02/2017 -----------------------------------------  Level V caveat: History/ROS limited by dementia   I have reviewed the triage vital signs and the nursing notes.   HISTORY   Chief Complaint Weakness    HPI Charles Nelson is a 83 y.o. male who presents to the ED for weakness. Staff at the group home states patient's falling a lot recently. They state when he gets up he shakes it is more unsteady. Patient denies any complaints at this time.   Past Medical History:  Diagnosis Date  . DVT (deep venous thrombosis) ([place])   . Insomnia   . Memory loss   . Neuropathy     Patient Active Problem List   Diagnosis Date Noted  . DVT (deep venous thrombosis) ([place]) 08/31/2016    Past Surgical History:  Procedure Laterality Date  . pt does not remember surgery hx      Allergies Patient has no known allergies.  Social History Social History  Substance Use Topics  . Smoking status: Never Smoker  . Smokeless tobacco: Never Used  . Alcohol use No    Review of Systems Constitutional: Negative for fever. Cardiovascular: Negative for chest pain. Respiratory: Negative for shortness of breath. Gastrointestinal: Negative for abdominal pain, vomiting and diarrhea. Genitourinary: Negative for dysuria. Musculoskeletal: Negative for back pain. Skin: Negative for rash. Neurological: Positive for weakness  All systems negative/normal/unremarkable except as stated in the HPI  ____________________________________________   PHYSICAL EXAM:  VITAL SIGNS: ED Triage Vitals [04/02/17 1550]  Enc Vitals Group     BP      Pulse      Resp      Temp      Temp src      SpO2      Weight 198 lb (89.8 kg)     Height 6' (1.829 m)     Head Circumference      Peak Flow      Pain Score      Pain Loc      Pain Edu?      Excl. in [place]?      Constitutional: Drowsy, but well appearing and in no distress. Eyes: Conjunctivae are normal. Normal extraocular movements. ENT   Head: Normocephalic and atraumatic.   Nose: No congestion/rhinnorhea.   Mouth/Throat: Mucous membranes are moist.   Neck: No stridor. Cardiovascular: Normal rate, regular rhythm. Cardiac murmur noted Respiratory: Normal respiratory effort without tachypnea nor retractions. Breath sounds are clear and equal bilaterally. No wheezes/rales/rhonchi. Gastrointestinal: Soft and nontender. Normal bowel sounds Musculoskeletal: Nontender with normal range of motion in extremities. No lower extremity tenderness nor edema. Neurologic:  Normal speech and language. No gross focal neurologic deficits are appreciated.  Skin:  Skin is warm, dry and intact. No rash noted. Psychiatric: Mood and affect are normal. Speech and behavior are normal.  ____________________________________________  EKG: Interpreted by me. Sinus rhythm rate of 68 bpm, normal PR interval, wide QRS, normal QT. Right bundle branch block pattern  ____________________________________________  ED COURSE:  Pertinent labs & imaging results that were available during my care of the patient were reviewed by me and considered in my medical decision making (see chart for details). Patient presents for weakness with frequent falls, we will assess with labs and imaging as indicated. Clinical Course as of Apr 02 1941  Sat Apr 02, 2017  9924 Patient does have difficulty walking but does well with  assistance. We will discuss with his group home.  [JW]    Clinical Course User Index [JW] Earleen Newport, MD   Procedures ____________________________________________   LABS (pertinent positives/negatives)  Labs Reviewed  BASIC METABOLIC PANEL - Abnormal; Notable for the following:       Result Value   Potassium 3.3 (*)    Glucose, Bld 100 (*)    All other components within normal limits   CBC - Abnormal; Notable for the following:    WBC 11.4 (*)    RBC 4.32 (*)    HCT 39.6 (*)    All other components within normal limits  URINALYSIS, COMPLETE (UACMP) WITH MICROSCOPIC - Abnormal; Notable for the following:    Color, Urine YELLOW (*)    APPearance CLEAR (*)    Leukocytes, UA TRACE (*)    Bacteria, UA RARE (*)    All other components within normal limits  TROPONIN I - Abnormal; Notable for the following:    Troponin I 0.03 (*)    All other components within normal limits  CBG MONITORING, ED    RADIOLOGY Images were viewed by me  CT head  IMPRESSION: 1. No acute intracranial hemorrhage. 2. Moderate age-related atrophy and chronic microvascular ischemic changes. If symptoms persist, and there are no contraindications, MRI may provide better evaluation if clinically indicated.  IMPRESSION: No evidence for acute cardiopulmonary abnormality. ____________________________________________  FINAL ASSESSMENT AND PLAN  Weakness, UTI  Plan: Patient's labs and imaging were dictated above. Patient had presented for Weakness and does have a mild UTI but otherwise no specific etiology was discovered. Patient can ambulate with assistance and he is advised that he uses a walker at his family care home. I have left a message with the family care home that should his needs exceed what their facility can provide his primary care doctor can arrange placement to a skilled nursing facility.   Earleen Newport, MD   Note: This note was generated in part or whole with voice recognition software. Voice recognition is usually quite accurate but there are transcription errors that can and very often do occur. I apologize for any typographical errors that were not detected and corrected.     Earleen Newport, MD 04/02/17 [phone]

## 2017-04-02 NOTE — ED Notes (Signed)
Report from kristin, rn.

## 2017-04-02 NOTE — ED Notes (Signed)
Ambulated pt with dr. Jimmye Norman, pt states he does use a walker, but that no one at the group home helps him ambulate.

## 2017-06-07 ENCOUNTER — Emergency Department
Admission: EM | Admit: 2017-06-07 | Discharge: 2017-06-07 | Disposition: A | Discharge status: Home / Self Care | Payer: Medicare Other | Attending: Emergency Medicine | Admitting: Emergency Medicine

## 2017-06-07 ENCOUNTER — Emergency Department: Payer: Medicare Other

## 2017-06-07 DIAGNOSIS — M25551 Pain in right hip: Secondary | ICD-10-CM | POA: Insufficient documentation

## 2017-06-07 DIAGNOSIS — Z7901 Long term (current) use of anticoagulants: Secondary | ICD-10-CM | POA: Diagnosis not present

## 2017-06-07 DIAGNOSIS — M25512 Pain in left shoulder: Secondary | ICD-10-CM | POA: Diagnosis not present

## 2017-06-07 DIAGNOSIS — Z23 Encounter for immunization: Secondary | ICD-10-CM | POA: Insufficient documentation

## 2017-06-07 DIAGNOSIS — R1084 Generalized abdominal pain: Secondary | ICD-10-CM | POA: Insufficient documentation

## 2017-06-07 DIAGNOSIS — M21371 Foot drop, right foot: Secondary | ICD-10-CM | POA: Insufficient documentation

## 2017-06-07 DIAGNOSIS — Z79899 Other long term (current) drug therapy: Secondary | ICD-10-CM | POA: Insufficient documentation

## 2017-06-07 DIAGNOSIS — W19XXXA Unspecified fall, initial encounter: Secondary | ICD-10-CM

## 2017-06-07 DIAGNOSIS — F0390 Unspecified dementia without behavioral disturbance: Secondary | ICD-10-CM | POA: Diagnosis not present

## 2017-06-07 DIAGNOSIS — R296 Repeated falls: Secondary | ICD-10-CM | POA: Diagnosis not present

## 2017-06-07 LAB — URINALYSIS, COMPLETE (UACMP) WITH MICROSCOPIC
Bacteria, UA: NONE SEEN
Bilirubin Urine: NEGATIVE
Glucose, UA: NEGATIVE mg/dL
Ketones, ur: NEGATIVE mg/dL
LEUKOCYTES UA: NEGATIVE
Nitrite: NEGATIVE
PH: 7 (ref 5.0–8.0)
Protein, ur: NEGATIVE mg/dL
SPECIFIC GRAVITY, URINE: 1.013 (ref 1.005–1.030)

## 2017-06-07 LAB — TROPONIN I

## 2017-06-07 LAB — CBC WITH DIFFERENTIAL/PLATELET
BASOS ABS: 0.1 10*3/uL (ref 0–0.1)
Basophils Relative: 1 %
EOS PCT: 1 %
Eosinophils Absolute: 0.1 10*3/uL (ref 0–0.7)
HCT: 39 % — ABNORMAL LOW (ref 40.0–52.0)
HEMOGLOBIN: 13.3 g/dL (ref 13.0–18.0)
LYMPHS PCT: 17 %
Lymphs Abs: 1.9 10*3/uL (ref 1.0–3.6)
MCH: 30.5 pg (ref 26.0–34.0)
MCHC: 34.1 g/dL (ref 32.0–36.0)
MCV: 89.5 fL (ref 80.0–100.0)
Monocytes Absolute: 1.3 10*3/uL — ABNORMAL HIGH (ref 0.2–1.0)
Monocytes Relative: 11 %
NEUTROS ABS: 7.9 10*3/uL — AB (ref 1.4–6.5)
NEUTROS PCT: 70 %
PLATELETS: 243 10*3/uL (ref 150–440)
RBC: 4.36 MIL/uL — AB (ref 4.40–5.90)
RDW: 14.7 % — ABNORMAL HIGH (ref 11.5–14.5)
WBC: 11.2 10*3/uL — AB (ref 3.8–10.6)

## 2017-06-07 LAB — COMPREHENSIVE METABOLIC PANEL
ALBUMIN: 3 g/dL — AB (ref 3.5–5.0)
ALT: 13 U/L — ABNORMAL LOW (ref 17–63)
ANION GAP: 8 (ref 5–15)
AST: 15 U/L (ref 15–41)
Alkaline Phosphatase: 62 U/L (ref 38–126)
BUN: 17 mg/dL (ref 6–20)
CHLORIDE: 102 mmol/L (ref 101–111)
CO2: 30 mmol/L (ref 22–32)
Calcium: 8.6 mg/dL — ABNORMAL LOW (ref 8.9–10.3)
Creatinine, Ser: 0.9 mg/dL (ref 0.61–1.24)
GFR calc non Af Amer: >60 mL/min (ref >60)
Glucose, Bld: 104 mg/dL — ABNORMAL HIGH (ref 65–99)
POTASSIUM: 3.7 mmol/L (ref 3.5–5.1)
SODIUM: 140 mmol/L (ref 135–145)
Total Bilirubin: 0.6 mg/dL (ref 0.3–1.2)
Total Protein: 6.2 g/dL — ABNORMAL LOW (ref 6.5–8.1)

## 2017-06-07 LAB — BRAIN NATRIURETIC PEPTIDE: B Natriuretic Peptide: 37 pg/mL (ref 0.0–100.0)

## 2017-06-07 MED ORDER — IOPAMIDOL (ISOVUE-300) INJECTION 61%
100.0000 mL | Freq: Once | INTRAVENOUS | Status: AC | PRN
  Administered 2017-06-07: 100 mL via INTRAVENOUS

## 2017-06-07 MED ORDER — TETANUS-DIPHTH-ACELL PERTUSSIS 5-2.5-18.5 LF-MCG/0.5 IM SUSP
0.5000 mL | Freq: Once | INTRAMUSCULAR | Status: AC
  Administered 2017-06-07: 0.5 mL via INTRAMUSCULAR
  Filled 2017-06-07: qty 0.5

## 2017-06-07 NOTE — ED Provider Notes (Signed)
[facility] Emergency Department Provider Note   ____________________________________________   First MD Initiated Contact with Patient 06/07/17 [phone]     (approximate)  I have reviewed the triage vital signs and the nursing notes.   HISTORY  Chief Complaint Fall (foot blisters, left shoulder, right hip pain)    HPI Charles Nelson is a 83 y.o. male Patient from care home nurse reports patient's been falling a lot lately. Fill yesterday and now complains of pain in the right hip and the left shoulder. EMS reports patient dragging the right foot when he walks. Patient says he feels okay since his hip and shoulder hurt but has no other complaints.   Past Medical History:  Diagnosis Date  . DVT (deep venous thrombosis) ([place])   . Insomnia   . Memory loss   . Neuropathy     Patient Active Problem List   Diagnosis Date Noted  . DVT (deep venous thrombosis) ([place]) 08/31/2016    Past Surgical History:  Procedure Laterality Date  . pt does not remember surgery hx      Prior to Admission medications   Medication Sig Start Date End Date Taking? Authorizing Provider  acetaminophen (TYLENOL) 500 MG tablet Take 1,000 mg by mouth 3 (three) times daily as needed for mild pain or moderate pain.    Yes [provider]  apixaban (ELIQUIS) 5 MG TABS tablet Take 1 tablet (5 mg total) by mouth 2 (two) times daily. To be started after the initial 6 days of 2 tabs twice a day dosing. 09/08/16  Yes Gladstone Lighter, MD  Cholecalciferol (VITAMIN D) 2000 units tablet Take 2,000 Units by mouth daily.   Yes [provider]  docusate sodium (COLACE) 100 MG capsule Take 100 mg by mouth 2 (two) times daily as needed for mild constipation.   Yes [provider]  furosemide (LASIX) 20 MG tablet Take 1 tablet (20 mg total) by mouth every other day. Patient taking differently: Take 20 mg by mouth 2 (two) times daily.  09/02/16 09/02/17 Yes Gladstone Lighter,  MD  Melatonin 10 MG TABS Take 10 mg by mouth at bedtime.   Yes [provider]  risperiDONE (RISPERDAL) 2 MG tablet Take 2 mg by mouth at bedtime.   Yes [provider]  traZODone (DESYREL) 100 MG tablet Take 100 mg by mouth at bedtime.   Yes [provider]  vitamin B-12 (CYANOCOBALAMIN) 1000 MCG tablet Take 2,000 mcg by mouth daily.   Yes [provider]    Allergies Patient has no known allergies.  Family History  Problem Relation Age of Onset  . Diabetes Neg Hx   . Hypertension Neg Hx   . Deep vein thrombosis Neg Hx     Social History Social History  Substance Use Topics  . Smoking status: Never Smoker  . Smokeless tobacco: Never Used  . Alcohol use No    Review of Systems  Constitutional: No fever/chills Eyes: No visual changes. ENT: No sore throat. Cardiovascular: Denies chest pain. Respiratory: Denies shortness of breath. Gastrointestinal: No abdominal pain.  No nausea, no vomiting.  No diarrhea.  No constipation. Genitourinary: Negative for dysuria. Musculoskeletal: Negative for back pain. Skin: Negative for rash. Neurological: Negative for headaches, focal weakness or numbness.   ____________________________________________   PHYSICAL EXAM:  VITAL SIGNS: ED Triage Vitals  Enc Vitals Group     BP 06/07/17 0732 132/70     Pulse Rate 06/07/17 0732 96     Resp  06/07/17 0732 16     Temp 06/07/17 0732 98.5 F (36.9 C)     Temp Source 06/07/17 0732 Oral     SpO2 06/07/17 0732 96 %     Weight 06/07/17 0737 177 lb 11.1 oz (80.6 kg)     Height 06/07/17 0737 6' (1.829 m)     Head Circumference --      Peak Flow --      Pain Score --      Pain Loc --      Pain Edu? --      Excl. in [place]? --     Constitutional: Alert and oriented. Well appearing and in no acute distress. Eyes: Conjunctivae are normal. PERRL. Head: Atraumatic. Nose: No congestion/rhinnorhea. Mouth/Throat: Mucous membranes are moist.  Oropharynx  non-erythematous. Neck: No stridor. Cardiovascular: Normal rate, regular rhythm. Grossly normal heart sounds.  Good peripheral circulation. Respiratory: Normal respiratory effort.  No retractions. Lungs CTAB. Gastrointestinal: Soft and nontender. No distention. No abdominal bruits. No CVA tenderness. Musculoskeletal:  no edema.  No joint effusions.there is some mild amount of pain on palpation of the right hip is also pain over the point of the left shoulder. Patient can hold both legs up wanted a time although he has more trouble with the right he cannot hold his left arm up because of the shoulder pain. Motor strength in the arms is equal bilaterally motor strength in the right leg seems to be slightly weaker than the left Neurologic:  Normal speech and language. No gross focal neurologic deficits are appreciated.see history of present illness for further details. Patient does not know the day or date says he does not watch TV this evening news because he can't do anything about it he does not remember his address either. Skin:  Skin is warm, dry skin is intact except for multiple abrasions on the right arm.there are no blisters on his feet. Psychiatric: Mood and affect are normal. Speech and behavior are normal.  ____________________________________________   LABS (all labs ordered are listed, but only abnormal results are displayed)  Labs Reviewed  COMPREHENSIVE METABOLIC PANEL - Abnormal; Notable for the following:       Result Value   Glucose, Bld 104 (*)    Calcium 8.6 (*)    Total Protein 6.2 (*)    Albumin 3.0 (*)    ALT 13 (*)    All other components within normal limits  CBC WITH DIFFERENTIAL/PLATELET - Abnormal; Notable for the following:    WBC 11.2 (*)    RBC 4.36 (*)    HCT 39.0 (*)    RDW 14.7 (*)    Neutro Abs 7.9 (*)    Monocytes Absolute 1.3 (*)    All other components within normal limits  URINALYSIS, COMPLETE (UACMP) WITH MICROSCOPIC - Abnormal; Notable for the  following:    Color, Urine YELLOW (*)    APPearance CLEAR (*)    Hgb urine dipstick MODERATE (*)    Squamous Epithelial / LPF 0-5 (*)    All other components within normal limits  BRAIN NATRIURETIC PEPTIDE  TROPONIN I   ____________________________________________  EKG  EKG read and interpreted by me shows normal sinus rhythm rate of 85 right bundle branch block no acute ST-T wave changes similar to previous EKG from 4 August 18 ____________________________________________  RADIOLOGY Head CT shows no acute changes chest x-ray shows no acute changes shoulder shows no acute changes there is some degenerative changes however. The patient's exam leads me  to believe he probably has some rotator cuff problem. hip x-ray shows some degenerative changes no acute changes however no fracture patient can walk with a walker _ IMPRESSION: No evidence of traumatic injury.  Mild left renal pelvicaliectasis and ureterectasis. 2 mm calculus within the urinary bladder is suspicious for recently passed calculus.  1.3 cm indeterminate cystic lesion in pancreatic body. Differential diagnosis includes indolent cystic pancreatic neoplasm and pseudocyst. Recommend continued imaging followup in 2 years to confirm stability, with abdomen MRI without and with contrast as the preferred exam. (Abd CT without and with contrast would be appropriate if patient has contraindication to MRI or cannot cooperate with breath-holding.)  Other incidental findings described above.   Electronically Signed   By: Earle Gell M.D.   On: 06/07/2017 10:45   the hematuria may be explained by the kidney stone in the bladder. Small if it should pass without any difficulty ___________________________________________    PROCEDURES  Procedure(s) performed:   Procedures  Critical Care performed:   ____________________________________________   INITIAL IMPRESSION / ASSESSMENT AND PLAN / ED COURSE  As  part of my medical decision making, I reviewed the following data within the [place]    patient with history of past falls no apparent reason for his falling at present. Will discharge him back home and have him follow-up with his regular doctor for further investigation of his falling and for follow-up of the pancreatic lesion found on the CT scan. Will recommend his doctor consider taking him off of the eliquis. 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 Could be safer to take him off of the eliquis I will let his doctor who knows him better decide which course to follow.     ____________________________________________   FINAL CLINICAL IMPRESSION(S) / ED DIAGNOSES  Final diagnoses:  Fall, initial encounter      NEW MEDICATIONS STARTED DURING THIS VISIT:  New Prescriptions   No medications on file     Note:  This document was prepared using Dragon voice recognition software and may include unintentional dictation errors.    Nena Polio, MD 06/07/17 1059

## 2017-06-07 NOTE — ED Notes (Deleted)
Patient understands follow-up with her OB-GYN and if pain worsens to see them or come to ED. After Visit summary reviewed with patient and patient had no questions, acknowledged understanding.

## 2017-06-07 NOTE — Discharge Instructions (Signed)
please follow-up with your doctor. Have your doctor review the CT reports from this visit. Have your doctor please consider whether or not to take you off of the Eliquis.  Follow up with Dr Marry Guan, orthopedics, to check on the shoulder. You may have injured the rotater cuff in your shoulder.

## 2017-06-07 NOTE — ED Notes (Signed)
Patient transported to CT 

## 2017-06-07 NOTE — ED Notes (Signed)
Pt wet prior to discharge. Pt cleaned and changed prior to leaving.

## 2017-06-07 NOTE — ED Notes (Addendum)
Pt's caregiver verbalized understanding of discharge instructions. NAD at this time. 

## 2017-06-07 NOTE — ED Notes (Addendum)
RN spoke with Lattie Haw at [facility] about getting the pt home. She will call [facility] to pick the patient up.

## 2017-06-07 NOTE — ED Triage Notes (Signed)
Patient has been falling lately, last night's fall patient stated he hurt his right hip and left shoulder. Currently denies pain

## 2017-06-07 NOTE — ED Notes (Signed)
RN went to discharge pt and pt is confused. Pt has hx of dementia.  RN called [facility] to inform them of her concern for sending the pt back in a cab. Lattie Haw at [facility] is working to find alternate transportation.

## 2017-06-27 ENCOUNTER — Encounter: Payer: Self-pay | Admitting: Emergency Medicine

## 2017-06-27 ENCOUNTER — Emergency Department: Payer: Medicare Other

## 2017-06-27 ENCOUNTER — Inpatient Hospital Stay
Admission: EM | Admit: 2017-06-27 | Discharge: 2017-06-30 | DRG: 301 | Disposition: A | Discharge status: Skilled Nursing Facility | Payer: Medicare Other | Attending: Internal Medicine | Admitting: Internal Medicine

## 2017-06-27 DIAGNOSIS — I10 Essential (primary) hypertension: Secondary | ICD-10-CM | POA: Diagnosis present

## 2017-06-27 DIAGNOSIS — M25512 Pain in left shoulder: Secondary | ICD-10-CM

## 2017-06-27 DIAGNOSIS — F0390 Unspecified dementia without behavioral disturbance: Secondary | ICD-10-CM | POA: Diagnosis present

## 2017-06-27 DIAGNOSIS — I82B22 Chronic embolism and thrombosis of left subclavian vein: Secondary | ICD-10-CM | POA: Diagnosis present

## 2017-06-27 DIAGNOSIS — I4510 Unspecified right bundle-branch block: Secondary | ICD-10-CM | POA: Diagnosis present

## 2017-06-27 DIAGNOSIS — G629 Polyneuropathy, unspecified: Secondary | ICD-10-CM | POA: Diagnosis present

## 2017-06-27 DIAGNOSIS — Z7901 Long term (current) use of anticoagulants: Secondary | ICD-10-CM | POA: Diagnosis not present

## 2017-06-27 DIAGNOSIS — I82622 Acute embolism and thrombosis of deep veins of left upper extremity: Secondary | ICD-10-CM

## 2017-06-27 DIAGNOSIS — G8929 Other chronic pain: Secondary | ICD-10-CM | POA: Diagnosis present

## 2017-06-27 DIAGNOSIS — Z9181 History of falling: Secondary | ICD-10-CM | POA: Diagnosis not present

## 2017-06-27 DIAGNOSIS — I8290 Acute embolism and thrombosis of unspecified vein: Secondary | ICD-10-CM

## 2017-06-27 DIAGNOSIS — I82409 Acute embolism and thrombosis of unspecified deep veins of unspecified lower extremity: Secondary | ICD-10-CM | POA: Diagnosis not present

## 2017-06-27 DIAGNOSIS — M75102 Unspecified rotator cuff tear or rupture of left shoulder, not specified as traumatic: Secondary | ICD-10-CM | POA: Diagnosis present

## 2017-06-27 DIAGNOSIS — I82C22 Chronic embolism and thrombosis of left internal jugular vein: Secondary | ICD-10-CM | POA: Diagnosis present

## 2017-06-27 DIAGNOSIS — M79602 Pain in left arm: Secondary | ICD-10-CM | POA: Diagnosis not present

## 2017-06-27 DIAGNOSIS — R2232 Localized swelling, mass and lump, left upper limb: Secondary | ICD-10-CM | POA: Diagnosis not present

## 2017-06-27 LAB — CBC WITH DIFFERENTIAL/PLATELET
BASOS ABS: 0.1 10*3/uL (ref 0–0.1)
BASOS PCT: 1 %
Eosinophils Absolute: 0 10*3/uL (ref 0–0.7)
Eosinophils Relative: 0 %
HEMATOCRIT: 41.5 % (ref 40.0–52.0)
HEMOGLOBIN: 13.8 g/dL (ref 13.0–18.0)
Lymphocytes Relative: 22 %
Lymphs Abs: 1.9 10*3/uL (ref 1.0–3.6)
MCH: 30.5 pg (ref 26.0–34.0)
MCHC: 33.4 g/dL (ref 32.0–36.0)
MCV: 91.4 fL (ref 80.0–100.0)
MONOS PCT: 9 %
Monocytes Absolute: 0.8 10*3/uL (ref 0.2–1.0)
NEUTROS ABS: 5.7 10*3/uL (ref 1.4–6.5)
NEUTROS PCT: 68 %
Platelets: 236 10*3/uL (ref 150–440)
RBC: 4.54 MIL/uL (ref 4.40–5.90)
RDW: 14.8 % — ABNORMAL HIGH (ref 11.5–14.5)
WBC: 8.5 10*3/uL (ref 3.8–10.6)

## 2017-06-27 LAB — BASIC METABOLIC PANEL
ANION GAP: 8 (ref 5–15)
BUN: 16 mg/dL (ref 6–20)
CHLORIDE: 101 mmol/L (ref 101–111)
CO2: 28 mmol/L (ref 22–32)
Calcium: 8.5 mg/dL — ABNORMAL LOW (ref 8.9–10.3)
Creatinine, Ser: 0.77 mg/dL (ref 0.61–1.24)
GFR calc non Af Amer: >60 mL/min (ref >60)
Glucose, Bld: 111 mg/dL — ABNORMAL HIGH (ref 65–99)
POTASSIUM: 3.6 mmol/L (ref 3.5–5.1)
SODIUM: 137 mmol/L (ref 135–145)

## 2017-06-27 LAB — HEPARIN LEVEL (UNFRACTIONATED): HEPARIN UNFRACTIONATED: 1.62 [IU]/mL — AB (ref 0.30–0.70)

## 2017-06-27 LAB — BRAIN NATRIURETIC PEPTIDE: B Natriuretic Peptide: 65 pg/mL (ref 0.0–100.0)

## 2017-06-27 LAB — APTT: APTT: 27 s (ref 24–36)

## 2017-06-27 LAB — TROPONIN I

## 2017-06-27 MED ORDER — RISPERIDONE 2 MG PO TABS
2.0000 mg | ORAL_TABLET | Freq: Every day | ORAL | Status: DC
  Administered 2017-06-28 – 2017-06-29 (×3): 2 mg via ORAL
  Filled 2017-06-27 (×4): qty 1

## 2017-06-27 MED ORDER — ONDANSETRON HCL 4 MG/2ML IJ SOLN: 4.0000 mg | Freq: Four times a day (QID) | INTRAMUSCULAR | Status: DC | PRN

## 2017-06-27 MED ORDER — HEPARIN BOLUS VIA INFUSION
4500.0000 [IU] | Freq: Once | INTRAVENOUS | Status: AC
  Administered 2017-06-27: 4500 [IU] via INTRAVENOUS
  Filled 2017-06-27: qty 4500

## 2017-06-27 MED ORDER — ONDANSETRON HCL 4 MG PO TABS: 4.0000 mg | ORAL_TABLET | Freq: Four times a day (QID) | ORAL | Status: DC | PRN

## 2017-06-27 MED ORDER — OXYCODONE HCL 5 MG PO TABS: 5.0000 mg | ORAL_TABLET | ORAL | Status: DC | PRN

## 2017-06-27 MED ORDER — ALBUTEROL SULFATE (2.5 MG/3ML) 0.083% IN NEBU
2.5000 mg | INHALATION_SOLUTION | RESPIRATORY_TRACT | Status: DC | PRN
Start: 2017-06-27 — End: 2017-06-30

## 2017-06-27 MED ORDER — ACETAMINOPHEN 500 MG PO TABS
1000.0000 mg | ORAL_TABLET | Freq: Once | ORAL | Status: AC
  Administered 2017-06-27: 1000 mg via ORAL
  Filled 2017-06-27: qty 2

## 2017-06-27 MED ORDER — POLYETHYLENE GLYCOL 3350 17 G PO PACK
17.0000 g | PACK | Freq: Every day | ORAL | Status: DC | PRN
  Administered 2017-06-30: 17 g via ORAL
  Filled 2017-06-27: qty 1

## 2017-06-27 MED ORDER — TRAZODONE HCL 100 MG PO TABS
100.0000 mg | ORAL_TABLET | Freq: Every day | ORAL | Status: DC
  Administered 2017-06-28 – 2017-06-29 (×3): 100 mg via ORAL
  Filled 2017-06-27 (×3): qty 1

## 2017-06-27 MED ORDER — FUROSEMIDE 20 MG PO TABS
20.0000 mg | ORAL_TABLET | Freq: Two times a day (BID) | ORAL | Status: DC
  Administered 2017-06-28 – 2017-06-30 (×5): 20 mg via ORAL
  Filled 2017-06-27 (×5): qty 1

## 2017-06-27 MED ORDER — ACETAMINOPHEN 650 MG RE SUPP: 650.0000 mg | Freq: Four times a day (QID) | RECTAL | Status: DC | PRN

## 2017-06-27 MED ORDER — ACETAMINOPHEN 325 MG PO TABS: 650.0000 mg | ORAL_TABLET | Freq: Four times a day (QID) | ORAL | Status: DC | PRN

## 2017-06-27 MED ORDER — HEPARIN (PORCINE) IN NACL 100-0.45 UNIT/ML-% IJ SOLN
1300.0000 [IU]/h | INTRAMUSCULAR | Status: DC
  Administered 2017-06-27: 1300 [IU]/h via INTRAVENOUS
  Filled 2017-06-27: qty 250

## 2017-06-27 NOTE — H&P (Signed)
[facility] NAME: Charles Nelson    MR#:  [id number]  DATE OF BIRTH:  07/23/1930  DATE OF ADMISSION:  06/27/2017  PRIMARY CARE PHYSICIAN: Patient, No Pcp Per   REQUESTING/REFERRING PHYSICIAN: Dr. Quentin Cornwall  CHIEF COMPLAINT:   Chief Complaint  Patient presents with  . Shoulder Pain    left shoulder pain post fall 1 month ago    HISTORY OF PRESENT ILLNESS:  Charles Nelson  is a 83 y.o. 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  Patient had a fall 1 month back causing right hip and left shoulder pain.  It was seen in orthopedics office had intra-articular injection with improvement of pain.  Today he had recurrence of pain and was sent to the emergency room.  Here left upper extremity Dopplers show Occlusive thrombus involving the left internal jugular vein and proximal left subclavian vein. Patient is poor historian.  He does have history of dementia.  Does not remember his fall one month back. He is on Eliquis due to nonocclusive DVT of lower extremities for many months. Left shoulder x-ray is suspicious for chronic rotator cuff tear.  PAST MEDICAL HISTORY:   Past Medical History:  Diagnosis Date  . DVT (deep venous thrombosis) ([place])   . Insomnia   . Memory loss   . Neuropathy     PAST SURGICAL HISTORY:   Past Surgical History:  Procedure Laterality Date  . pt does not remember surgery hx      SOCIAL HISTORY:   Social History  Substance Use Topics  . Smoking status: Never Smoker  . Smokeless tobacco: Never Used  . Alcohol use No    FAMILY HISTORY:   Family History  Problem Relation Age of Onset  . Diabetes Neg Hx   . Hypertension Neg Hx   . Deep vein thrombosis Neg Hx     DRUG ALLERGIES:  No Known Allergies  REVIEW OF SYSTEMS:   Review of Systems  Unable to perform ROS:  Dementia    MEDICATIONS AT HOME:   Prior to Admission medications   Medication Sig Start Date End Date Taking? Authorizing Provider  acetaminophen (TYLENOL) 500 MG tablet Take 1,000 mg by mouth 3 (three) times daily as needed for mild pain or moderate pain.     [provider]  apixaban (ELIQUIS) 5 MG TABS tablet Take 1 tablet (5 mg total) by mouth 2 (two) times daily. To be started after the initial 6 days of 2 tabs twice a day dosing. 09/08/16   Gladstone Lighter, MD  Cholecalciferol (VITAMIN D) 2000 units tablet Take 2,000 Units by mouth daily.    [provider]  docusate sodium (COLACE) 100 MG capsule Take 100 mg by mouth 2 (two) times daily as needed for mild constipation.    [provider]  furosemide (LASIX) 20 MG tablet Take 1 tablet (20 mg total) by mouth every other day. Patient taking differently: Take 20 mg by mouth 2 (two) times daily.  09/02/16 09/02/17  Gladstone Lighter, MD  Melatonin 10 MG TABS Take 10 mg by mouth at bedtime.    [provider]  risperiDONE (RISPERDAL) 2 MG tablet Take 2 mg by mouth at bedtime.    [provider]  traZODone (DESYREL) 100 MG tablet Take 100 mg by mouth at bedtime.    [provider]  vitamin B-12 (CYANOCOBALAMIN) 1000 MCG tablet Take 2,000 mcg by mouth daily.    [provider]     VITAL SIGNS:  Blood pressure 133/82, pulse 80, temperature 98.7 F (37.1 C), temperature source Oral, resp. rate 16, height 6' (1.829 m), weight 80.3 kg (177 lb), SpO2 96 %.  PHYSICAL EXAMINATION:  Physical Exam  GENERAL:  83 y.o.-year-old patient lying in the bed with no acute distress.  EYES: Pupils equal, round, reactive to light and accommodation. No scleral icterus. Extraocular muscles intact.  HEENT: Head atraumatic, normocephalic. Oropharynx and nasopharynx clear. No oropharyngeal erythema, moist oral mucosa  NECK:  Supple, no jugular venous distention. No thyroid enlargement, no tenderness.   LUNGS: Normal breath sounds bilaterally, no wheezing, rales, rhonchi. No use of accessory muscles of respiration.  CARDIOVASCULAR: S1, S2 normal. No murmurs, rubs, or gallops.  ABDOMEN: Soft, nontender, nondistended. Bowel sounds present. No organomegaly or mass.  EXTREMITIES: No pedal edema, cyanosis, or clubbing. + 2 pedal & radial pulses b/l.   NEUROLOGIC: Cranial nerves II through XII are intact. No focal Motor or sensory deficits appreciated b/l PSYCHIATRIC: The patient is alert and awake.  Not oriented to place or time SKIN: No obvious rash, lesion, or ulcer.   LABORATORY PANEL:   CBC  Recent Labs Lab 06/27/17 2019  WBC 8.5  HGB 13.8  HCT 41.5  PLT 236   ------------------------------------------------------------------------------------------------------------------  Chemistries   Recent Labs Lab 06/27/17 2019  NA 137  K 3.6  CL 101  CO2 28  GLUCOSE 111*  BUN 16  CREATININE 0.77  CALCIUM 8.5*   ------------------------------------------------------------------------------------------------------------------  Cardiac Enzymes  Recent Labs Lab 06/27/17 2019  TROPONINI <0.03   ------------------------------------------------------------------------------------------------------------------  RADIOLOGY:  US Venous Img Upper Uni Left  Result Date: 06/27/2017 CLINICAL DATA:  83 year old male with left shoulder pain. EXAM: Left UPPER EXTREMITY VENOUS DOPPLER ULTRASOUND TECHNIQUE: Gray-scale sonography with graded compression, as well as color Doppler and duplex ultrasound were performed to evaluate the upper extremity deep venous system from the level of the subclavian vein and including the jugular, axillary, basilic, radial, ulnar and upper cephalic vein. Spectral Doppler was utilized to evaluate flow at rest and with distal augmentation maneuvers. COMPARISON:  None. FINDINGS: Contralateral Subclavian Vein: Respiratory phasicity is normal and symmetric with the  symptomatic side. No evidence of thrombus. Normal compressibility. Internal Jugular Vein: There is noncompressibility of the left internal jugular vein. There is echogenic thrombus within the vein. No flow identified on color images. Subclavian Vein: There is occlusive thrombus in the proximal left subclavian vein. The distal subclavian vein appears patent. Axillary Vein: No evidence of thrombus. Normal compressibility, respiratory phasicity and response to augmentation. Cephalic Vein: No evidence of thrombus. Normal compressibility, respiratory phasicity and response to augmentation. Basilic Vein: No evidence of thrombus. Normal compressibility, respiratory phasicity and response to augmentation. Brachial Veins: No evidence of thrombus. Normal compressibility, respiratory phasicity and response to augmentation. Radial Veins: No evidence of thrombus. Normal compressibility, respiratory phasicity and response to augmentation. Ulnar Veins: No evidence of thrombus. Normal compressibility, respiratory phasicity and response to augmentation. Venous Reflux:  None visualized. Other Findings:  None visualized. IMPRESSION: Occlusive thrombus involving the left internal jugular vein and proximal left subclavian vein. These results were called by telephone at the time of interpretation on 06/27/2017 at 7:56 pm to Dr. Merlyn Lot , who verbally acknowledged these results. Electronically Signed   By: Anner Crete M.D.   On: 06/27/2017 20:06   Dg Chest Portable 1  View  Result Date: 06/27/2017 CLINICAL DATA:  Left shoulder pain after fall 1 month ago. EXAM: PORTABLE CHEST 1 VIEW COMPARISON:  06/07/2017 FINDINGS: The included right shoulder arthroplasty appears intact. The visualized left shoulder demonstrates a high-riding humeral head which can be seen in chronic rotator cuff tears. No acute fracture or dislocation. There is osteoarthritis of the left glenohumeral joint with inferomedial spurring of the humeral  head. The bony thorax appears intact. Heart is top-normal size. Aortic atherosclerosis along the arch and descending aorta without aneurysmal dilatation. Calcified nodule at the left lung base consistent with a granuloma appears stable. No acute pneumonic consolidation. Emphysematous hyperinflation of the lungs. IMPRESSION: 1. Emphysematous hyperinflation of the lungs with aortic atherosclerosis. No active disease. 2. Probable granuloma at the left lung base. 3. Osteoarthritis of the left glenohumeral joint with high-riding humeral head which can be a reflection of a chronic rotator cuff tear. Electronically Signed   By: Ashley Royalty M.D.   On: 06/27/2017 18:54     IMPRESSION AND PLAN:   * Occlusive thrombus involving the left internal jugular vein and proximal left subclavian vein. This has occurred in spite of being on Eliquis.  Likely due to recent trauma and immobility of the arm due to pain. We will consult vascular surgery.  Will likely need angiogram. Hold Eliquis.  Start heparin drip.  Pharmacy to dose.  Pain medications added as needed.  *Dementia.  Watch for inpatient delirium.  All the records are reviewed and case discussed with ED provider. Management plans discussed with the patient, family and they are in agreement.  CODE STATUS: Full code  TOTAL TIME TAKING CARE OF THIS PATIENT: 35 minutes.   Hillary Bow R M.D on 06/27/2017 at 9:35 PM  Between 7am to 6pm - Pager - [phone]  After 6pm go to [email or web address] - password [email or web address] [facility]  Office  [phone]  CC: Primary care physician; Patient, No Pcp Per  Note: This dictation was prepared with Dragon dictation along with smaller phrase technology. Any transcriptional errors that result from this process are unintentional.

## 2017-06-27 NOTE — ED Notes (Addendum)
Spoke with Corbin Ade from [facility] [phone]

## 2017-06-27 NOTE — ED Triage Notes (Signed)
Pt presents to ED via EMS from [facility] c/o left shoulder pain post fall 45month ago. Pt was followed by Dr. Marry Guan. Care services concerned of blood clot

## 2017-06-27 NOTE — Progress Notes (Signed)
ANTICOAGULATION CONSULT NOTE - Initial Consult  Pharmacy Consult for heparin Indication: DVT  No Known Allergies  Patient Measurements: Height: 6' (182.9 cm) Weight: 177 lb (80.3 kg) IBW/kg (Calculated) : 77.6 Heparin Dosing Weight: 80.3 kg  Vital Signs: Temp: 98.7 F (37.1 C) (10/29 1821) Temp Source: Oral (10/29 1821) BP: 133/82 (10/29 1845) Pulse Rate: 80 (10/29 1845)  Labs: No results for input(s): HGB, HCT, PLT, APTT, LABPROT, INR, HEPARINUNFRC, HEPRLOWMOCWT, CREATININE, CKTOTAL, CKMB, TROPONINI in the last 72 hours.  Estimated Creatinine Clearance: 65.9 mL/min (by C-G formula based on SCr of 0.9 mg/dL).   Medical History: Past Medical History:  Diagnosis Date  . DVT (deep venous thrombosis) ([place])   . Insomnia   . Memory loss   . Neuropathy     Medications:  Infusions:  . heparin      Assessment: 87 yom cc shoulder pain post fall 1 month prior. US shows occlusive thrombus involving left internal jugular vein and proximal left subclavian vein. Patient has been on Eliquis PTA and last dose was approximately 19:00 per RN. Appears to be Eliquis failure so we will start heparin now and check baseline aPTT/PT INR/HL, then adjust based on aPTT until aPTT and HL correlate.   Goal of Therapy:  Heparin level 0.3-0.7 units/ml aPTT 66 to 102 seconds Monitor platelets by anticoagulation protocol: Yes   Plan:  Give 4500 units bolus x 1 Start heparin infusion at 1300 units/hr Check aPTT in 6 hours and anti-Xa level daily until levels correlate while on heparin Continue to monitor H&H and platelets  Laural Benes, Pharm.D., BCPS Clinical Pharmacist 06/27/2017,8:27 PM

## 2017-06-27 NOTE — ED Provider Notes (Signed)
[facility] Emergency Department Provider Note    First MD Initiated Contact with Patient 06/27/17 1818     (approximate)  I have reviewed the triage vital signs and the nursing notes.   HISTORY  Chief Complaint Shoulder Pain (left shoulder pain post fall 1 month ago)    HPI Charles Nelson is a 83 y.o. male history of DVT not currently on any anticoagulation presents from [facility] with chief complaint of left shoulder pain for 1 month.  Patient had a fall 1 month ago and was diagnosed with a rotator cuff injury and was followed by Dr. Marry Guan of orthopedics.  He was given steroid injection with improvement in symptoms.  This morning the patient began complaining of left shoulder pain again.  He denies any chest pain shortness of breath.  No bruits.  Patient was sent to the ER due to concern for DVT and for worsening pain.  He was not getting anything for the pain at home.  Currently rated as mild in severity.  No radiation.  No numbness or tingling.  Past Medical History:  Diagnosis Date  . DVT (deep venous thrombosis) ([place])   . Insomnia   . Memory loss   . Neuropathy    Family History  Problem Relation Age of Onset  . Diabetes Neg Hx   . Hypertension Neg Hx   . Deep vein thrombosis Neg Hx    Past Surgical History:  Procedure Laterality Date  . pt does not remember surgery hx     Patient Active Problem List   Diagnosis Date Noted  . DVT (deep venous thrombosis) ([place]) 08/31/2016      Prior to Admission medications   Medication Sig Start Date End Date Taking? Authorizing Provider  acetaminophen (TYLENOL) 500 MG tablet Take 1,000 mg by mouth 3 (three) times daily as needed for mild pain or moderate pain.     [provider]  apixaban (ELIQUIS) 5 MG TABS tablet Take 1 tablet (5 mg total) by mouth 2 (two) times daily. To be started after the initial 6 days of 2 tabs twice a day dosing. 09/08/16   Gladstone Lighter, MD    Cholecalciferol (VITAMIN D) 2000 units tablet Take 2,000 Units by mouth daily.    [provider]  docusate sodium (COLACE) 100 MG capsule Take 100 mg by mouth 2 (two) times daily as needed for mild constipation.    [provider]  furosemide (LASIX) 20 MG tablet Take 1 tablet (20 mg total) by mouth every other day. Patient taking differently: Take 20 mg by mouth 2 (two) times daily.  09/02/16 09/02/17  Gladstone Lighter, MD  Melatonin 10 MG TABS Take 10 mg by mouth at bedtime.    [provider]  risperiDONE (RISPERDAL) 2 MG tablet Take 2 mg by mouth at bedtime.    [provider]  traZODone (DESYREL) 100 MG tablet Take 100 mg by mouth at bedtime.    [provider]  vitamin B-12 (CYANOCOBALAMIN) 1000 MCG tablet Take 2,000 mcg by mouth daily.    [provider]    Allergies Patient has no known allergies.    Social History Social History  Substance Use Topics  . Smoking status: Never Smoker  . Smokeless tobacco: Never Used  . Alcohol use No    Review of Systems Patient denies headaches, rhinorrhea, blurry vision, numbness, shortness of breath, chest pain, edema, cough, abdominal pain, nausea, vomiting, diarrhea, dysuria, fevers, rashes or hallucinations unless  otherwise stated above in HPI. ____________________________________________   PHYSICAL EXAM:  VITAL SIGNS: Vitals:   06/27/17 1830 06/27/17 1845  BP: 131/77 133/82  Pulse: 80 80  Resp: 16 16  Temp:    SpO2: 97% 96%    Constitutional: Alert and in no acute distress. Eyes: Conjunctivae are normal.  Head: Atraumatic. Nose: No congestion/rhinnorhea. Mouth/Throat: Mucous membranes are moist.   Neck: Painless ROM.  Cardiovascular:   Good peripheral circulation. Respiratory: Normal respiratory effort.  No retractions.  Gastrointestinal: Soft and nontender.  Musculoskeletal: There is mild left shoulder tenderness to palpation without effusion crepitus or  deformities.  No overlying cellulitis or edema.  No lower extremity tenderness .  No joint effusions. Neurologic:  Normal speech and language. No gross focal neurologic deficits are appreciated.  Skin:  Skin is warm, dry and intact. No rash noted. Psychiatric: Mood and affect are normal. Speech and behavior are normal.  ____________________________________________   LABS (all labs ordered are listed, but only abnormal results are displayed)  Results for orders placed or performed during the hospital encounter of 06/27/17 (from the past 24 hour(s))  CBC with Differential/Platelet     Status: Abnormal   Collection Time: 06/27/17  8:19 PM  Result Value Ref Range   WBC 8.5 3.8 - 10.6 K/uL   RBC 4.54 4.40 - 5.90 MIL/uL   Hemoglobin 13.8 13.0 - 18.0 g/dL   HCT 41.5 40.0 - 52.0 %   MCV 91.4 80.0 - 100.0 fL   MCH 30.5 26.0 - 34.0 pg   MCHC 33.4 32.0 - 36.0 g/dL   RDW 14.8 (H) 11.5 - 14.5 %   Platelets 236 150 - 440 K/uL   Neutrophils Relative % 68 %   Neutro Abs 5.7 1.4 - 6.5 K/uL   Lymphocytes Relative 22 %   Lymphs Abs 1.9 1.0 - 3.6 K/uL   Monocytes Relative 9 %   Monocytes Absolute 0.8 0.2 - 1.0 K/uL   Eosinophils Relative 0 %   Eosinophils Absolute 0.0 0 - 0.7 K/uL   Basophils Relative 1 %   Basophils Absolute 0.1 0 - 0.1 K/uL  Basic metabolic panel     Status: Abnormal   Collection Time: 06/27/17  8:19 PM  Result Value Ref Range   Sodium 137 135 - 145 mmol/L   Potassium 3.6 3.5 - 5.1 mmol/L   Chloride 101 101 - 111 mmol/L   CO2 28 22 - 32 mmol/L   Glucose, Bld 111 (H) 65 - 99 mg/dL   BUN 16 6 - 20 mg/dL   Creatinine, Ser 0.77 0.61 - 1.24 mg/dL   Calcium 8.5 (L) 8.9 - 10.3 mg/dL   GFR calc non Af Amer >60 >60 mL/min   GFR calc Af Amer >60 >60 mL/min   Anion gap 8 5 - 15  APTT     Status: None   Collection Time: 06/27/17  8:19 PM  Result Value Ref Range   aPTT 27 24 - 36 seconds  Heparin level (unfractionated)     Status: Abnormal   Collection Time: 06/27/17  8:19 PM   Result Value Ref Range   Heparin Unfractionated 1.62 (H) 0.30 - 0.70 IU/mL  Troponin I     Status: None   Collection Time: 06/27/17  8:19 PM  Result Value Ref Range   Troponin I <0.03 <0.03 ng/mL  Brain natriuretic peptide     Status: None   Collection Time: 06/27/17  8:19 PM  Result Value Ref Range   B Natriuretic  Peptide 65.0 0.0 - 100.0 pg/mL   ____________________________________________  EKG My review and personal interpretation at Time: 18:28   Indication: dvt  Rate: 80  Rhythm: sinus Axis: normal Other: rbbb, normal intevals, no stemi ____________________________________________  RADIOLOGY  I personally reviewed all radiographic images ordered to evaluate for the above acute complaints and reviewed radiology reports and findings.  These findings were personally discussed with the patient.  Please see medical record for radiology report.  ____________________________________________   PROCEDURES  Procedure(s) performed:  Procedures    Critical Care performed: yes CRITICAL CARE Performed by: Merlyn Lot   Total critical care time: 30 minutes  Critical care time was exclusive of separately billable procedures and treating other patients.  Critical care was necessary to treat or prevent imminent or life-threatening deterioration.  Critical care was time spent personally by me on the following activities: development of treatment plan with patient and/or surrogate as well as nursing, discussions with consultants, evaluation of patient's response to treatment, examination of patient, obtaining history from patient or surrogate, ordering and performing treatments and interventions, ordering and review of laboratory studies, ordering and review of radiographic studies, pulse oximetry and re-evaluation of patient's condition.  ____________________________________________   INITIAL IMPRESSION / ASSESSMENT AND PLAN / ED COURSE  Pertinent labs & imaging results  that were available during my care of the patient were reviewed by me and considered in my medical decision making (see chart for details).  DDX: dvt, arthritis, contusion, dissection, ptx, pna, effusion, acs  Cameran Pettey is a 83 y.o. who presents to the ED with left shoulder pain as described above.  Patient is AFVSS in ED. Exam as above. Given current presentation have considered the above differential.  Will order x-ray to evaluate for pneumothorax arthritis or dislocation as well as ultrasound due to concern for DVT.  The patient will be placed on continuous pulse oximetry and telemetry for monitoring.  Laboratory evaluation will be sent to evaluate for the above complaints.      Clinical Course as of Jun 27 2113  Mon Jun 27, 2017  2023 Patient with evidence of occlusive left IJ and subclavian vein thrombus failing outpatient management with Eliquis.  Will start on heparin drip.  Does not have any clinical evidence of significant pulmonary embolism at this time will further risk stratify with troponin and BNP  [PR]    Clinical Course User Index [PR] Merlyn Lot, MD   ----------------------------------------- 9:11 PM on 06/27/2017 -----------------------------------------  I spoke with Dr. Tobias Alexander of vascular surgery who agrees with plan for heparinization and admission to hospital for further hypercoagulability evaluation.  Will evaluate patient in the morning for possible catheter directed thrombolysis.  Patient remains hemodynamically stable.  Blood work is reassuring. ____________________________________________   FINAL CLINICAL IMPRESSION(S) / ED DIAGNOSES  Final diagnoses:  Acute pain of left shoulder  Acute deep vein thrombosis (DVT) of other vein of left upper extremity (HCC)      NEW MEDICATIONS STARTED DURING THIS VISIT:  New Prescriptions   No medications on file     Note:  This document was prepared using Dragon voice recognition software and may include  unintentional dictation errors.      Merlyn Lot, MD 06/27/17 2114

## 2017-06-28 DIAGNOSIS — I82409 Acute embolism and thrombosis of unspecified deep veins of unspecified lower extremity: Secondary | ICD-10-CM

## 2017-06-28 DIAGNOSIS — M79602 Pain in left arm: Secondary | ICD-10-CM

## 2017-06-28 DIAGNOSIS — R2232 Localized swelling, mass and lump, left upper limb: Secondary | ICD-10-CM

## 2017-06-28 LAB — BASIC METABOLIC PANEL
Anion gap: 4 — ABNORMAL LOW (ref 5–15)
BUN: 14 mg/dL (ref 6–20)
CALCIUM: 8.3 mg/dL — AB (ref 8.9–10.3)
CO2: 28 mmol/L (ref 22–32)
Chloride: 106 mmol/L (ref 101–111)
Creatinine, Ser: 0.89 mg/dL (ref 0.61–1.24)
GFR calc Af Amer: >60 mL/min (ref >60)
GLUCOSE: 110 mg/dL — AB (ref 65–99)
POTASSIUM: 3.5 mmol/L (ref 3.5–5.1)
Sodium: 138 mmol/L (ref 135–145)

## 2017-06-28 LAB — CBC
HEMATOCRIT: 38.3 % — AB (ref 40.0–52.0)
Hemoglobin: 12.9 g/dL — ABNORMAL LOW (ref 13.0–18.0)
MCH: 30.9 pg (ref 26.0–34.0)
MCHC: 33.7 g/dL (ref 32.0–36.0)
MCV: 91.6 fL (ref 80.0–100.0)
Platelets: 230 10*3/uL (ref 150–440)
RBC: 4.18 MIL/uL — ABNORMAL LOW (ref 4.40–5.90)
RDW: 14.7 % — AB (ref 11.5–14.5)
WBC: 7.2 10*3/uL (ref 3.8–10.6)

## 2017-06-28 LAB — HEPARIN LEVEL (UNFRACTIONATED): Heparin Unfractionated: 1.06 IU/mL — ABNORMAL HIGH (ref 0.30–0.70)

## 2017-06-28 LAB — APTT
APTT: 76 s — AB (ref 24–36)
aPTT: >160 seconds (ref 24–36)
aPTT: 107 seconds — ABNORMAL HIGH (ref 24–36)

## 2017-06-28 LAB — PROTIME-INR
INR: 1.16
PROTHROMBIN TIME: 14.7 s (ref 11.4–15.2)

## 2017-06-28 MED ORDER — HEPARIN (PORCINE) IN NACL 100-0.45 UNIT/ML-% IJ SOLN
1000.0000 [IU]/h | INTRAMUSCULAR | Status: DC
  Administered 2017-06-28: 1100 [IU]/h via INTRAVENOUS
  Administered 2017-06-28: 1000 [IU]/h via INTRAVENOUS
  Filled 2017-06-28: qty 250

## 2017-06-28 NOTE — Progress Notes (Signed)
ANTICOAGULATION CONSULT NOTE - Initial Consult  Pharmacy Consult for heparin Indication: DVT  No Known Allergies  Patient Measurements: Height: 6' (182.9 cm) Weight: 175 lb 1.6 oz (79.4 kg) IBW/kg (Calculated) : 77.6 Heparin Dosing Weight: 80.3 kg  Vital Signs: Temp: 98.9 F (37.2 C) (10/30 0923) Temp Source: Oral (10/30 0923) BP: 134/70 (10/30 0923) Pulse Rate: 71 (10/30 0923)  Labs:  Recent Labs  06/27/17 2019 06/28/17 0312 06/28/17 1215  HGB 13.8 12.9*  --   HCT 41.5 38.3*  --   PLT 236 230  --   APTT 27 >160* 107*  LABPROT  --  14.7  --   INR  --  1.16  --   HEPARINUNFRC 1.62*  --   --   CREATININE 0.77 0.89  --   TROPONINI <0.03  --   --     Estimated Creatinine Clearance: 66.6 mL/min (by C-G formula based on SCr of 0.89 mg/dL).   Medical History: Past Medical History:  Diagnosis Date  . DVT (deep venous thrombosis) ([place])   . Insomnia   . Memory loss   . Neuropathy     Medications:  Infusions:  . heparin 1,100 Units/hr (06/28/17 0606)    Assessment: 87 yom cc shoulder pain post fall 1 month prior. US shows occlusive thrombus involving left internal jugular vein and proximal left subclavian vein. Patient has been on Eliquis PTA and last dose was approximately 19:00 per RN. Appears to be Eliquis failure so we will start heparin now and check baseline aPTT/PT INR/HL, then adjust based on aPTT until aPTT and HL correlate.   Goal of Therapy:  Heparin level 0.3-0.7 units/ml aPTT 66 to 102 seconds Monitor platelets by anticoagulation protocol: Yes   Plan:  Give 4500 units bolus x 1 Start heparin infusion at 1300 units/hr Check aPTT in 6 hours and anti-Xa level daily until levels correlate while on heparin Continue to monitor H&H and platelets  10/30 0300 aPTT >160. Hold drip x 1 hours and decrease rate to 1100 units/hr. Recheck aPTT 6 hours after restart.  10/30 1215 aPTT 107. Will decrease heparin drip to 1000u/hr and recheck HL and aPTT in 6  hours.   Valen Gillison M Shanasia Ibrahim, Pharm.D., BCPS Clinical Pharmacist 06/28/2017,1:55 PM

## 2017-06-28 NOTE — Progress Notes (Addendum)
ANTICOAGULATION CONSULT NOTE  Pharmacy Consult for heparin Indication: DVT  No Known Allergies  Patient Measurements: Height: 6' (182.9 cm) Weight: 175 lb 1.6 oz (79.4 kg) IBW/kg (Calculated) : 77.6 Heparin Dosing Weight: 79.4 kg  Vital Signs: Temp: 99.9 F (37.7 C) (10/30 2016) Temp Source: Oral (10/30 2016) BP: 138/70 (10/30 2016) Pulse Rate: 86 (10/30 2016)  Labs:  Recent Labs  06/27/17 2019 06/28/17 0312 06/28/17 1215 06/28/17 2049  HGB 13.8 12.9*  --   --   HCT 41.5 38.3*  --   --   PLT 236 230  --   --   APTT 27 >160* 107* 76*  LABPROT  --  14.7  --   --   INR  --  1.16  --   --   HEPARINUNFRC 1.62*  --   --  1.06*  CREATININE 0.77 0.89  --   --   TROPONINI <0.03  --   --   --     Estimated Creatinine Clearance: 66.6 mL/min (by C-G formula based on SCr of 0.89 mg/dL).   Medical History: Past Medical History:  Diagnosis Date  . DVT (deep venous thrombosis) ([place])   . Insomnia   . Memory loss   . Neuropathy     Medications:  Infusions:  . heparin 1,000 Units/hr (06/28/17 1540)    Assessment: 87 yom cc shoulder pain post fall 1 month prior. US shows occlusive thrombus involving left internal jugular vein and proximal left subclavian vein. Patient has been on Eliquis PTA and last dose was approximately 19:00 on 10/29 per RN. Appears to be Eliquis failure so we will start heparin now and check baseline aPTT/PT INR/HL, then adjust based on aPTT until aPTT and HL correlate.   Goal of Therapy:  Heparin level 0.3-0.7 units/ml aPTT 66 to 102 seconds Monitor platelets by anticoagulation protocol: Yes   Plan:  Give 4500 units bolus x 1 Start heparin infusion at 1300 units/hr Check aPTT in 6 hours and anti-Xa level daily until levels correlate while on heparin Continue to monitor H&H and platelets  10/30 0300 aPTT >160. Hold drip x 1 hours and decrease rate to 1100 units/hr. Recheck aPTT 6 hours after restart.  10/30 1215 aPTT 107. Will decrease heparin  drip to 1000u/hr and recheck HL and aPTT in 6 hours.   10/30 2049 aPTT 76, HL 1.06. APTT within goal range. Will continue heparin drip at current rate. No s/sx of bleeding noted per RN. Recheck HL and aPTT in 6h. CBC in AM.  Pharmacy will continue to follow.   Rocky Morel, Pharm.D., BCPS Clinical Pharmacist 06/28/2017,9:47 PM    10/31 AM aPTT 93, heparin level 1.12. Continue current regimen. Recheck aPTT, heparin level, and CBC with tomorrow AM labs.  Sim Boast, PharmD, BCPS  06/29/17 6:41 AM

## 2017-06-28 NOTE — Progress Notes (Signed)
ANTICOAGULATION CONSULT NOTE - Initial Consult  Pharmacy Consult for heparin Indication: DVT  No Known Allergies  Patient Measurements: Height: 6' (182.9 cm) Weight: 175 lb 1.6 oz (79.4 kg) IBW/kg (Calculated) : 77.6 Heparin Dosing Weight: 80.3 kg  Vital Signs: Temp: 98.6 F (37 C) (10/29 2308) Temp Source: Oral (10/29 2308) BP: 155/77 (10/29 2308) Pulse Rate: 67 (10/29 2308)  Labs:  Recent Labs  06/27/17 2019 06/28/17 0312  HGB 13.8 12.9*  HCT 41.5 38.3*  PLT 236 230  APTT 27 >160*  LABPROT  --  14.7  INR  --  1.16  HEPARINUNFRC 1.62*  --   CREATININE 0.77 0.89  TROPONINI <0.03  --     Estimated Creatinine Clearance: 66.6 mL/min (by C-G formula based on SCr of 0.89 mg/dL).   Medical History: Past Medical History:  Diagnosis Date  . DVT (deep venous thrombosis) ([place])   . Insomnia   . Memory loss   . Neuropathy     Medications:  Infusions:  . heparin      Assessment: 87 yom cc shoulder pain post fall 1 month prior. US shows occlusive thrombus involving left internal jugular vein and proximal left subclavian vein. Patient has been on Eliquis PTA and last dose was approximately 19:00 per RN. Appears to be Eliquis failure so we will start heparin now and check baseline aPTT/PT INR/HL, then adjust based on aPTT until aPTT and HL correlate.   Goal of Therapy:  Heparin level 0.3-0.7 units/ml aPTT 66 to 102 seconds Monitor platelets by anticoagulation protocol: Yes   Plan:  Give 4500 units bolus x 1 Start heparin infusion at 1300 units/hr Check aPTT in 6 hours and anti-Xa level daily until levels correlate while on heparin Continue to monitor H&H and platelets  10/30 0300 aPTT >160. Hold drip x 1 hours and decrease rate to 1100 units/hr. Recheck aPTT 6 hours after restart.  Rilley Poulter S, Pharm.D., BCPS Clinical Pharmacist 06/28/2017,5:01 AM

## 2017-06-28 NOTE — Progress Notes (Signed)
Patient seen and examined.  At the time of my interview he denied shoulder pain.  Physical examination left arm is without any evidence of edema.  Nontender to palpation 2+ brachial and radial pulses.  No evidence or stigmata of venous hypertension.  Range of motion is consistent with severe rotator cuff injury.  Duplex ultrasound has been examined personally by me.  The images of the internal jugular vein as well as the subclavian vein demonstrates contracted markedly heterogeneous thrombus which is most consistent with chronic old residual thrombus.  There is no evidence or stigmata of acute DVT.  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  I would not plan for any intervention at this time as there is no indication of acute thrombus.  It is very unlikely given the chronic nature that this is contributing in any way to his shoulder pain.  Given his overall condition and his advanced dementia orthopedic evaluation for his rotator cuff injury would not likely be helpful given he is such a poor surgical candidate.  Consideration could be made for starting Celebrex to see if this would give him significant pain relief from and anti-inflammatory.  I have discussed my findings with his guardian and she and is in agreement.  We will not move forward with any intervention.

## 2017-06-28 NOTE — Clinical Social Work Note (Addendum)
Clinical Social Work Assessment  Patient Details  Name: Charles Nelson MRN: [id number] Date of Birth: 11-13-1929  Date of referral:  06/28/17               Reason for consult:  Other (Comment Required) (From [facility]. )                Permission sought to share information with:  Facility Art therapist granted to share information::  Yes, Verbal Permission Granted  Name::        Agency::     Relationship::     Contact Information:     Housing/Transportation Living arrangements for the past 2 months:  [place] of Information:  Other (Comment Required) (Niece Charles Nelson ) Patient Interpreter Needed:  None Criminal Activity/Legal Involvement Pertinent to Current Situation/Hospitalization:  No - Comment as needed Significant Relationships:  Other Family Members Lives with:  Facility Resident Do you feel safe going back to the place where you live?  Yes Need for family participation in patient care:  Yes (Comment)  Care giving concerns:  Patient is a resident at [facility] group home in [place] (fax: [phone]).    Social Worker assessment / plan:  Holiday representative (CSW) reviewed chart and noted that patient is from a group home. Social work Theatre manager attempted to meet with patient however he was pleasantly confused. CSW contacted patient's niece Charles Nelson to get additional information. Per Charles Nelson she is patient's HPOA and his primary contact. Per Charles Nelson patient has lived at [facility] group home for 1 year now and walks with a walker at baseline. Per Charles Nelson before he lived at the group home he was living with her in [place]. 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 Per Charles Nelson she is very happy with [facility] because it is a small facility with only 6 residents. Charles Nelson is agreeable for patient to return to the group home. CSW attempted to contact group home owner  [place] [phone] however he did not answer and a voicemail was left. CSW contact group home and spoke to staff member Aniceto Boss who confirmed the above information. Per Aniceto Boss patient is on room air at baseline and can return to the group home when stable. CSW will continue to follow and assist as needed.   Group home owner Serita Grit called CSW back and was made aware of above. Per [facility] patient can return to the group home when stable.   Employment status:  Retired, Disabled (Comment on whether or not currently receiving Disability) Insurance information:  Medicare PT Recommendations:  Not assessed at this time Information / Referral to community resources:  Other (Comment Required) (Patient will retrun to [facility]. )  Patient/Family's Response to care:  Patient's niece Charles Nelson is agreeable for patient to return to group home.   Patient/Family's Understanding of and Emotional Response to Diagnosis, Current Treatment, and Prognosis: Patient's niece Charles Nelson was very pleasant and thanked CSW for assistance.   Emotional Assessment Appearance:  Appears stated age Attitude/Demeanor/Rapport:    Affect (typically observed):  Pleasant Orientation:  Oriented to Self, Fluctuating Orientation (Suspected and/or reported Sundowners) Alcohol / Substance use:  Not Applicable Psych involvement (Current and /or in the community):  No (Comment)  Discharge Needs  Concerns to be addressed:  Discharge Planning Concerns Readmission within the last 30 days:  No Current discharge risk:  Chronically ill Barriers to Discharge:  Continued Medical Work up   [employer], [employer], LCSW 06/28/2017, 12:00 PM

## 2017-06-28 NOTE — NC FL2 (Signed)
[place] LEVEL OF CARE SCREENING TOOL     IDENTIFICATION  Patient Name: Charles Nelson Birthdate: 09-23-1929 Sex: male Admission Date (Current Location): 06/27/2017  [place] and [place] Number:  Engineering geologist and Address:  [facility], [address], [place], [place] [address]      Provider Number: [id number]  Attending Physician Name and Address:  Vaughan Basta, *  Relative Name and Phone Number:       Current Level of Care: Hospital Recommended Level of Care: [place]  Prior Approval Number:    Date Approved/Denied:   PASRR Number:  [id number])  Discharge Plan: [place]     Current Diagnoses: Patient Active Problem List   Diagnosis Date Noted  . Confirmed venous thromboembolism (VTE) 06/27/2017  . DVT (deep venous thrombosis) ([place]) 08/31/2016    Orientation RESPIRATION BLADDER Height & Weight     Self, Place  Normal Continent Weight: 175 lb 1.6 oz (79.4 kg) Height:  6' (182.9 cm)  BEHAVIORAL SYMPTOMS/MOOD NEUROLOGICAL BOWEL NUTRITION STATUS      Continent Diet (Carb Modified)  AMBULATORY STATUS COMMUNICATION OF NEEDS Skin   Extensive Assistance Verbally Normal                       Personal Care Assistance Level of Assistance  Bathing, Feeding, Dressing Bathing Assistance: Limited assistance Feeding assistance: Independent Dressing Assistance: Limited assistance     Functional Limitations Info  Sight, Hearing, Speech Sight Info: Adequate Hearing Info: Adequate Speech Info: Adequate    SPECIAL CARE FACTORS FREQUENCY  PT (By licensed PT) and OT      PT and OT 5 days per week               Contractures      Additional Factors Info  Code Status, Allergies Code Status Info:  (Full Code) Allergies Info:  (No Known Allergies)           Current Medications (06/28/2017):  This is the current hospital active medication list Current  Facility-Administered Medications  Medication Dose Route Frequency Provider Last Rate Last Dose  . acetaminophen (TYLENOL) tablet 650 mg  650 mg Oral Q6H PRN Hillary Bow, MD       Or  . acetaminophen (TYLENOL) suppository 650 mg  650 mg Rectal Q6H PRN Sudini, Srikar, MD      . albuterol (PROVENTIL) (2.5 MG/3ML) 0.083% nebulizer solution 2.5 mg  2.5 mg Nebulization Q2H PRN Sudini, Srikar, MD      . furosemide (LASIX) tablet 20 mg  20 mg Oral BID Sudini, Srikar, MD      . heparin ADULT infusion 100 units/mL (25000 units/212mL sodium chloride 0.45%)  1,100 Units/hr Intravenous Continuous Merlyn Lot, MD 11 mL/hr at 06/28/17 0606 1,100 Units/hr at 06/28/17 0606  . ondansetron (ZOFRAN) tablet 4 mg  4 mg Oral Q6H PRN Hillary Bow, MD       Or  . ondansetron (ZOFRAN) injection 4 mg  4 mg Intravenous Q6H PRN Sudini, Srikar, MD      . oxyCODONE (Oxy IR/ROXICODONE) immediate release tablet 5 mg  5 mg Oral Q4H PRN Sudini, Srikar, MD      . polyethylene glycol (MIRALAX / GLYCOLAX) packet 17 g  17 g Oral Daily PRN Sudini, Alveta Heimlich, MD      . risperiDONE (RISPERDAL) tablet 2 mg  2 mg Oral QHS Hillary Bow, MD   2 mg at 06/28/17 0014  . traZODone (DESYREL) tablet  100 mg  100 mg Oral QHS Hillary Bow, MD   100 mg at 06/28/17 0015     Discharge Medications: Please see discharge summary for a list of discharge medications.  Relevant Imaging Results:  Relevant Lab Results:   Additional Information  (SSN: [id number])  Smith Mince, Student-Social Work

## 2017-06-29 LAB — HEPARIN LEVEL (UNFRACTIONATED): HEPARIN UNFRACTIONATED: 1.12 [IU]/mL — AB (ref 0.30–0.70)

## 2017-06-29 LAB — PROTIME-INR
INR: 1.08
Prothrombin Time: 13.9 seconds (ref 11.4–15.2)

## 2017-06-29 LAB — CBC
HCT: 39.6 % — ABNORMAL LOW (ref 40.0–52.0)
Hemoglobin: 13 g/dL (ref 13.0–18.0)
MCH: 30 pg (ref 26.0–34.0)
MCHC: 32.9 g/dL (ref 32.0–36.0)
MCV: 91.2 fL (ref 80.0–100.0)
PLATELETS: 209 10*3/uL (ref 150–440)
RBC: 4.34 MIL/uL — AB (ref 4.40–5.90)
RDW: 14.7 % — AB (ref 11.5–14.5)
WBC: 6.8 10*3/uL (ref 3.8–10.6)

## 2017-06-29 LAB — APTT: APTT: 93 s — AB (ref 24–36)

## 2017-06-29 MED ORDER — APIXABAN 5 MG PO TABS: 10.0000 mg | ORAL_TABLET | Freq: Two times a day (BID) | ORAL | Status: DC

## 2017-06-29 MED ORDER — APIXABAN 5 MG PO TABS: 5.0000 mg | ORAL_TABLET | Freq: Two times a day (BID) | ORAL | Status: DC

## 2017-06-29 MED ORDER — APIXABAN 5 MG PO TABS
10.0000 mg | ORAL_TABLET | Freq: Two times a day (BID) | ORAL | Status: DC
  Administered 2017-06-29 – 2017-06-30 (×3): 10 mg via ORAL
  Filled 2017-06-29 (×3): qty 2

## 2017-06-29 MED ORDER — HEPARIN (PORCINE) IN NACL 100-0.45 UNIT/ML-% IJ SOLN: 1000.0000 [IU]/h | INTRAMUSCULAR | Status: AC

## 2017-06-29 MED ORDER — CELECOXIB 100 MG PO CAPS
100.0000 mg | ORAL_CAPSULE | Freq: Two times a day (BID) | ORAL | Status: DC
  Administered 2017-06-29 – 2017-06-30 (×3): 100 mg via ORAL
  Filled 2017-06-29 (×4): qty 1

## 2017-06-29 NOTE — Progress Notes (Signed)
Clinical Social Worker (CSW) presented bed offers to patient's niece April and she chose [facility]. [facility] liaison is aware of accepted bed offer. Plan is for patient to D/C to [facility] tomorrow pending medical clearance. CSW will continue to follow and assist as needed.   [employer], LCSW [phone]

## 2017-06-29 NOTE — Evaluation (Signed)
Physical Therapy Evaluation Patient Details Name: Charles Nelson MRN: [id number] DOB: 1930/05/05 Today's Date: 06/29/2017   History of Present Illness  presented to ER secondary to worsening L shoulder/UE pain; admitted with occlusive thrombus to L IJ, proximal L subclavian vein (heparin initiated 10/29, 2045)  Clinical Impression  Upon evaluation, patient alert and oriented to self only; follows simple commands, but requires increased time for processing.  L UE with significant ROM limitations at shoulder (due to pain, chronic/severe RTC injury; not surgical candidate); strength otherwise generally deconditioned.  LEs/knees very weak and tremulous (mod buckling) with all movement transitions.  Requiring min/mod assist and RW for all mobility; poor balance/righting reactions.  Very high risk for posterior LOB/fall. Would benefit from skilled PT to address above deficits and promote optimal return to PLOF; recommend transition to STR upon discharge from acute hospitalization.     Follow Up Recommendations SNF    Equipment Recommendations       Recommendations for Other Services       Precautions / Restrictions Precautions Precautions: Fall Restrictions Weight Bearing Restrictions: No      Mobility  Bed Mobility Overal bed mobility: Needs Assistance Bed Mobility: Supine to Sit     Supine to sit: Min assist;Mod assist        Transfers Overall transfer level: Needs assistance Equipment used: Rolling walker (2 wheeled) Transfers: Sit to/from Stand Sit to Stand: Min assist;Mod assist         General transfer comment: min progressing to mod assist with LE fatigue; LEs very tremulous, mod buckling/jerking during movement transitions  Ambulation/Gait Ambulation/Gait assistance: Min assist;Mod assist Ambulation Distance (Feet): 30 Feet Assistive device: Rolling walker (2 wheeled)       General Gait Details: very short, choppy steps with L foot drop (poor clearance, heel  strike/toe off), forward flexed posture; poor balance/righting reactions with constant posterior weight shift, assist to prevent posterior LOB  Stairs            Wheelchair Mobility    Modified Rankin (Stroke Patients Only)       Balance Overall balance assessment: Needs assistance Sitting-balance support: No upper extremity supported;Feet supported Sitting balance-Leahy Scale: Fair     Standing balance support: Bilateral upper extremity supported Standing balance-Leahy Scale: Poor                               Pertinent Vitals/Pain Pain Assessment: Faces Faces Pain Scale: Hurts even more Pain Location: L shoulder with any attempts at active/passive movement Pain Descriptors / Indicators: Aching;Grimacing;Guarding Pain Intervention(s): Limited activity within patient's tolerance;Monitored during session;Repositioned    Home Living Family/patient expects to be discharged to::  (Group home)                 Additional Comments: Patient unable to provide details of home environment    Prior Function Level of Independence: Needs assistance         Comments: Per caregiver at group home (confirmed via telephone call), patient ambulatory with RW at baseline.  Requires "boosting" assist with rising from seating surfaces, but able to mobilize throughout facility and complete toileting needs without hands-on assist.  Chart significant for recent fall (1 months prior) with injury to L shoulder      Hand Dominance        Extremity/Trunk Assessment   Upper Extremity Assessment Upper Extremity Assessment:  (R UE elevation limited to shoulder height (chronic, arthritic changes), otherwise grossly  WFL.  L UE with minimal/no active elevation, passive elevation to 60-70 degrees (limited by pain), elbow, wrist and hand grossly wFL)    Lower Extremity Assessment Lower Extremity Assessment: Generalized weakness (generally 3+/5 throughout; tremulous, buckling  with movement transitions (poor eccentric control))       Communication   Communication: No difficulties  Cognition Arousal/Alertness: Awake/alert Behavior During Therapy: WFL for tasks assessed/performed Overall Cognitive Status: No family/caregiver present to determine baseline cognitive functioning                                 General Comments: oriented to self only; follows simple commands, but with increased time for processing      General Comments      Exercises Other Exercises Other Exercises: Sit/stand x4 with RW, min progressing to mod assist with repetition-constant cuing for hand placement, lift off and standing balance Other Exercises: Standing balance for hygiene, clothing management after incontinent bladder episode, min/mod assist to prevent posterior LOB.   Assessment/Plan    PT Assessment Patient needs continued PT services  PT Problem List Decreased strength;Decreased activity tolerance;Decreased balance;Decreased mobility;Decreased coordination;Decreased cognition;Decreased knowledge of use of DME;Decreased safety awareness;Decreased knowledge of precautions;Cardiopulmonary status limiting activity       PT Treatment Interventions DME instruction;Gait training;Balance training;Functional mobility training;Therapeutic activities;Therapeutic exercise;Patient/family education    PT Goals (Current goals can be found in the Care Plan section)  Acute Rehab PT Goals PT Goal Formulation: Patient unable to participate in goal setting Time For Goal Achievement: 07/13/17 Potential to Achieve Goals: Fair    Frequency Min 2X/week   Barriers to discharge Decreased caregiver support      Co-evaluation               AM-PAC PT "6 Clicks" Daily Activity  Outcome Measure Difficulty turning over in bed (including adjusting bedclothes, sheets and blankets)?: Unable Difficulty moving from lying on back to sitting on the side of the bed? :  Unable Difficulty sitting down on and standing up from a chair with arms (e.g., wheelchair, bedside commode, etc,.)?: Unable Help needed moving to and from a bed to chair (including a wheelchair)?: A Lot Help needed walking in hospital room?: A Lot Help needed climbing 3-5 steps with a railing? : Total 6 Click Score: 8    End of Session Equipment Utilized During Treatment: Gait Nelson Activity Tolerance: Patient tolerated treatment well Patient left: in chair;with call bell/phone within reach;with chair alarm set Nurse Communication: Mobility status PT Visit Diagnosis: Difficulty in walking, not elsewhere classified (R26.2);Muscle weakness (generalized) (M62.81);Unsteadiness on feet (R26.81)    Time: 9924-2683 PT Time Calculation (min) (ACUTE ONLY): 22 min   Charges:   PT Evaluation $PT Eval Low Complexity: 1 Low PT Treatments $Therapeutic Activity: 8-22 mins   PT G Codes:   PT G-Codes **NOT FOR INPATIENT CLASS** Functional Assessment Tool Used: AM-PAC 6 Clicks Basic Mobility Functional Limitation: Mobility: Walking and moving around Mobility: Walking and Moving Around Current Status (M1962): At least 60 percent but less than 80 percent impaired, limited or restricted Mobility: Walking and Moving Around Goal Status [phone]): At least 1 percent but less than 20 percent impaired, limited or restricted    .Shondra Capps H. Owens Shark, PT, DPT, NCS 06/29/17, 1:53 PM [phone]

## 2017-06-29 NOTE — Progress Notes (Signed)
PT is recommending SNF. Clinical Social Worker (CSW) contacted patient's niece April and made her aware of above and that medicare requires a 3 night qualifying inpatient stay in a hospital in order to pay for SNF. Patient was admitted to inpatient on 06/27/17. April verbalized her understanding and is agreeable to SNF search in [place]. April prefers [facility]. FL2 complete and faxed out. CSW will continue to follow and assist as needed.   [employer], LCSW [phone]

## 2017-06-29 NOTE — Clinical Social Work Placement (Signed)
   CLINICAL SOCIAL WORK PLACEMENT  NOTE  Date:  06/29/2017  Patient Details  Name: Charles Nelson MRN: [id number] Date of Birth: 06/02/1930  Clinical Social Work is seeking post-discharge placement for this patient at the [place] level of care (*CSW will initial, date and re-position this form in  chart as items are completed):  Yes   Patient/family provided with [place] Work Department's list of facilities offering this level of care within the geographic area requested by the patient (or if unable, by the patient's family).  Yes   Patient/family informed of their freedom to choose among providers that offer the needed level of care, that participate in Medicare, Medicaid or managed care program needed by the patient, have an available bed and are willing to accept the patient.  Yes   Patient/family informed of 's ownership interest in [facility] and [facility], as well as of the fact that they are under no obligation to receive care at these facilities.  PASRR submitted to EDS on       PASRR number received on       Existing PASRR number confirmed on 06/29/17     FL2 transmitted to all facilities in geographic area requested by pt/family on 06/29/17     FL2 transmitted to all facilities within larger geographic area on       Patient informed that his/her managed care company has contracts with or will negotiate with certain facilities, including the following:            Patient/family informed of bed offers received.  Patient chooses bed at       Physician recommends and patient chooses bed at      Patient to be transferred to   on  .  Patient to be transferred to facility by       Patient family notified on   of transfer.  Name of family member notified:        PHYSICIAN       Additional Comment:    _______________________________________________ Oyinkansola Truax, Veronia Beets, LCSW 06/29/2017, 1:28 PM

## 2017-06-29 NOTE — Progress Notes (Signed)
Charles Nelson for Apixaban  Indication: DVT  No Known Allergies  Patient Measurements: Height: 6' (182.9 cm) Weight: 175 lb 1.6 oz (79.4 kg) IBW/kg (Calculated) : 77.6 Heparin Dosing Weight: 79.4 kg  Vital Signs:    Labs:  Recent Labs  06/27/17 2019 06/28/17 0312 06/28/17 1215 06/28/17 2049 06/29/17 0407  HGB 13.8 12.9*  --   --  13.0  HCT 41.5 38.3*  --   --  39.6*  PLT 236 230  --   --  209  APTT 27 >160* 107* 76* 93*  LABPROT  --  14.7  --   --  13.9  INR  --  1.16  --   --  1.08  HEPARINUNFRC 1.62*  --   --  1.06* 1.12*  CREATININE 0.77 0.89  --   --   --   TROPONINI <0.03  --   --   --   --     Estimated Creatinine Clearance: 66.6 mL/min (by C-G formula based on SCr of 0.89 mg/dL).   Medical History: Past Medical History:  Diagnosis Date  . DVT (deep venous thrombosis) ([place])   . Insomnia   . Memory loss   . Neuropathy     Medications:  Infusions:  . heparin      Assessment: Charles Nelson 1 month prior. US shows occlusive thrombus involving left internal jugular vein and proximal left subclavian vein. According to Dr. Delana Meyer- the images indicate that this is  most consistent with chronic old residual thrombus.    Patient has been on Eliquis PTA.   Plan:  After discussion with Dr. Anselm Jungling will restart treatment regimen. Start Apixaban 10mg  BID x 7 days, followed by apixaban 5mg  BID thereafter.   Pernell Dupre, PharmD, BCPS Clinical Pharmacist 06/29/2017 9:59 AM

## 2017-06-29 NOTE — Progress Notes (Signed)
[facility] at [place] NAME: Charles Nelson    MR#:  [id number]  DATE OF BIRTH:  1929/12/22  SUBJECTIVE:  CHIEF COMPLAINT:   Chief Complaint  Patient presents with  . Shoulder Pain    left shoulder pain post fall 1 month ago     Dementia, DVT- have rotator cuff injuries , came with shoulder pain complains, noted to have DVT in IJ and subclavian vein on right. Not much pain now,on heparin drip.  REVIEW OF SYSTEMS:  Pt have dementia, can not give ROS.  ROS  DRUG ALLERGIES:  No Known Allergies  VITALS:  Blood pressure 138/70, pulse 86, temperature 99.9 F (37.7 C), temperature source Oral, resp. rate 19, height 6' (1.829 m), weight 79.4 kg (175 lb 1.6 oz), SpO2 92 %.  PHYSICAL EXAMINATION:   GENERAL:  83 y.o.-year-old patient lying in the bed with no acute distress.  EYES: Pupils equal, round, reactive to light and accommodation. No scleral icterus. Extraocular muscles intact.  HEENT: Head atraumatic, normocephalic. Oropharynx and nasopharynx clear. No oropharyngeal erythema, moist oral mucosa  NECK:  Supple, no jugular venous distention. No thyroid enlargement, no tenderness.  LUNGS: Normal breath sounds bilaterally, no wheezing, rales, rhonchi. No use of accessory muscles of respiration.  CARDIOVASCULAR: S1, S2 normal. No murmurs, rubs, or gallops.  ABDOMEN: Soft, nontender, nondistended. Bowel sounds present. No organomegaly or mass.  EXTREMITIES: No pedal edema, cyanosis, or clubbing. + 2 pedal & radial pulses b/l.   NEUROLOGIC: Cranial nerves II through XII are intact. No focal Motor or sensory deficits appreciated b/l PSYCHIATRIC: The patient is alert and awake.  Not oriented to place or time SKIN: No obvious rash, lesion, or ulcer.  Physical Exam LABORATORY PANEL:   CBC  Recent Labs Lab 06/29/17 0407  WBC 6.8  HGB 13.0  HCT 39.6*  PLT 209    ------------------------------------------------------------------------------------------------------------------  Chemistries   Recent Labs Lab 06/28/17 0312  NA 138  K 3.5  CL 106  CO2 28  GLUCOSE 110*  BUN 14  CREATININE 0.89  CALCIUM 8.3*   ------------------------------------------------------------------------------------------------------------------  Cardiac Enzymes  Recent Labs Lab 06/27/17 2019  TROPONINI <0.03   ------------------------------------------------------------------------------------------------------------------  RADIOLOGY:  US Venous Img Upper Uni Left  Result Date: 06/27/2017 CLINICAL DATA:  83 year old male with left shoulder pain. EXAM: Left UPPER EXTREMITY VENOUS DOPPLER ULTRASOUND TECHNIQUE: Gray-scale sonography with graded compression, as well as color Doppler and duplex ultrasound were performed to evaluate the upper extremity deep venous system from the level of the subclavian vein and including the jugular, axillary, basilic, radial, ulnar and upper cephalic vein. Spectral Doppler was utilized to evaluate flow at rest and with distal augmentation maneuvers. COMPARISON:  None. FINDINGS: Contralateral Subclavian Vein: Respiratory phasicity is normal and symmetric with the symptomatic side. No evidence of thrombus. Normal compressibility. Internal Jugular Vein: There is noncompressibility of the left internal jugular vein. There is echogenic thrombus within the vein. No flow identified on color images. Subclavian Vein: There is occlusive thrombus in the proximal left subclavian vein. The distal subclavian vein appears patent. Axillary Vein: No evidence of thrombus. Normal compressibility, respiratory phasicity and response to augmentation. Cephalic Vein: No evidence of thrombus. Normal compressibility, respiratory phasicity and response to augmentation. Basilic Vein: No evidence of thrombus. Normal compressibility, respiratory phasicity and response  to augmentation. Brachial Veins: No evidence of thrombus. Normal compressibility, respiratory phasicity and response to augmentation. Radial Veins: No evidence of thrombus. Normal compressibility, respiratory phasicity and response to augmentation.  Ulnar Veins: No evidence of thrombus. Normal compressibility, respiratory phasicity and response to augmentation. Venous Reflux:  None visualized. Other Findings:  None visualized. IMPRESSION: Occlusive thrombus involving the left internal jugular vein and proximal left subclavian vein. These results were called by telephone at the time of interpretation on 06/27/2017 at 7:56 pm to Dr. Merlyn Lot , who verbally acknowledged these results. Electronically Signed   By: Anner Crete M.D.   On: 06/27/2017 20:06   Dg Chest Portable 1 View  Result Date: 06/27/2017 CLINICAL DATA:  Left shoulder pain after fall 1 month ago. EXAM: PORTABLE CHEST 1 VIEW COMPARISON:  06/07/2017 FINDINGS: The included right shoulder arthroplasty appears intact. The visualized left shoulder demonstrates a high-riding humeral head which can be seen in chronic rotator cuff tears. No acute fracture or dislocation. There is osteoarthritis of the left glenohumeral joint with inferomedial spurring of the humeral head. The bony thorax appears intact. Heart is top-normal size. Aortic atherosclerosis along the arch and descending aorta without aneurysmal dilatation. Calcified nodule at the left lung base consistent with a granuloma appears stable. No acute pneumonic consolidation. Emphysematous hyperinflation of the lungs. IMPRESSION: 1. Emphysematous hyperinflation of the lungs with aortic atherosclerosis. No active disease. 2. Probable granuloma at the left lung base. 3. Osteoarthritis of the left glenohumeral joint with high-riding humeral head which can be a reflection of a chronic rotator cuff tear. Electronically Signed   By: Ashley Royalty M.D.   On: 06/27/2017 18:54    ASSESSMENT AND  PLAN:   Active Problems:   Confirmed venous thromboembolism (VTE)  * Occlusive thrombus involving the left internal jugular vein and proximal left subclavian vein. This has occurred in spite of being on Eliquis.  Likely due to recent trauma and immobility of the arm due to pain. consult vascular surgery.   Hold Eliquis.  Started on heparin drip.  Pharmacy to dose.  Pain medications added as needed.  *Dementia.  Watch for inpatient delirium.      All the records are reviewed and case discussed with Care Management/Social Workerr. Management plans discussed with the patient, family and they are in agreement.  CODE STATUS: Full.  TOTAL TIME TAKING CARE OF THIS PATIENT: 35 minutes.     POSSIBLE D/C IN 1-2 DAYS, DEPENDING ON CLINICAL CONDITION.   Vaughan Basta M.D on 06/29/2017   Between 7am to 6pm - Pager - [phone]  After 6pm go to [email or web address] - password [email or web address] [facility]  Office  [phone]  CC: Primary care physician; Patient, No Pcp Per  Note: This dictation was prepared with Dragon dictation along with smaller phrase technology. Any transcriptional errors that result from this process are unintentional.

## 2017-06-30 LAB — CBC
HEMATOCRIT: 40.1 % (ref 40.0–52.0)
HEMOGLOBIN: 13.5 g/dL (ref 13.0–18.0)
MCH: 30.7 pg (ref 26.0–34.0)
MCHC: 33.7 g/dL (ref 32.0–36.0)
MCV: 91.3 fL (ref 80.0–100.0)
Platelets: 201 10*3/uL (ref 150–440)
RBC: 4.4 MIL/uL (ref 4.40–5.90)
RDW: 14.5 % (ref 11.5–14.5)
WBC: 7.5 10*3/uL (ref 3.8–10.6)

## 2017-06-30 MED ORDER — CELECOXIB 100 MG PO CAPS: 100.0000 mg | ORAL_CAPSULE | Freq: Two times a day (BID) | ORAL | 0 refills | Status: DC

## 2017-06-30 MED ORDER — POLYETHYLENE GLYCOL 3350 17 G PO PACK: 17.0000 g | PACK | Freq: Every day | ORAL | 0 refills | Status: AC | PRN

## 2017-06-30 MED ORDER — ELIQUIS 5 MG VTE STARTER PACK: ORAL_TABLET | ORAL | 0 refills | Status: DC

## 2017-06-30 NOTE — Progress Notes (Signed)
[facility] at [place] NAME: Charles Nelson    MR#:  [id number]  DATE OF BIRTH:  1929/10/01  SUBJECTIVE:  CHIEF COMPLAINT:   Chief Complaint  Patient presents with  . Shoulder Pain    left shoulder pain post fall 1 month ago     Dementia, DVT- have rotator cuff injuries , came with shoulder pain complains, noted to have DVT in IJ and subclavian vein on right. Not much pain now,on heparin drip. 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  REVIEW OF SYSTEMS:  Pt have dementia, can not give ROS.  ROS  DRUG ALLERGIES:  No Known Allergies  VITALS:  Blood pressure (!) 148/67, pulse 62, temperature 98.2 F (36.8 C), temperature source Oral, resp. rate 18, height 6' (1.829 m), weight 79.9 kg (176 lb 1.6 oz), SpO2 96 %.  PHYSICAL EXAMINATION:   GENERAL:  83 y.o.-year-old patient lying in the bed with no acute distress.  EYES: Pupils equal, round, reactive to light and accommodation. No scleral icterus. Extraocular muscles intact.  HEENT: Head atraumatic, normocephalic. Oropharynx and nasopharynx clear. No oropharyngeal erythema, moist oral mucosa  NECK:  Supple, no jugular venous distention. No thyroid enlargement, no tenderness.  LUNGS: Normal breath sounds bilaterally, no wheezing, rales, rhonchi. No use of accessory muscles of respiration.  CARDIOVASCULAR: S1, S2 normal. No murmurs, rubs, or gallops.  ABDOMEN: Soft, nontender, nondistended. Bowel sounds present. No organomegaly or mass.  EXTREMITIES: No pedal edema, cyanosis, or clubbing. + 2 pedal & radial pulses b/l.   NEUROLOGIC: Cranial nerves II through XII are intact. No focal Motor or sensory deficits appreciated b/l PSYCHIATRIC: The patient is alert and awake.  Not oriented to place or time SKIN: No obvious rash, lesion, or ulcer.  Physical Exam LABORATORY PANEL:   CBC  Recent Labs Lab 06/30/17 0332  WBC 7.5  HGB 13.5  HCT 40.1  PLT 201    ------------------------------------------------------------------------------------------------------------------  Chemistries   Recent Labs Lab 06/28/17 0312  NA 138  K 3.5  CL 106  CO2 28  GLUCOSE 110*  BUN 14  CREATININE 0.89  CALCIUM 8.3*   ------------------------------------------------------------------------------------------------------------------  Cardiac Enzymes  Recent Labs Lab 06/27/17 2019  TROPONINI <0.03   ------------------------------------------------------------------------------------------------------------------  RADIOLOGY:  No results found.  ASSESSMENT AND PLAN:   Active Problems:   Confirmed venous thromboembolism (VTE)  * Occlusive thrombus involving the left internal jugular vein and proximal left subclavian vein. This has occurred in spite of being on Eliquis.  Likely due to recent trauma and immobility of the arm due to pain. consult vascular surgery.   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  *Dementia.  Watch for inpatient delirium.   All the records are reviewed and case discussed with Care Management/Social Workerr. Management plans discussed with the patient, family and they are in agreement.  CODE STATUS: Full.  TOTAL TIME TAKING CARE OF THIS PATIENT: 35 minutes.    POSSIBLE D/C IN 1-2 DAYS, DEPENDING ON CLINICAL CONDITION.   Vaughan Basta M.D on 06/30/2017   Between 7am to 6pm - Pager - [phone]  After 6pm go to [email or web address] - password [email or web address] [facility]  Office  [phone]  CC: Primary care physician; Patient, No Pcp Per  Note: This dictation was prepared with Dragon dictation along with smaller phrase technology. Any transcriptional errors that  result from this process are unintentional.

## 2017-06-30 NOTE — Progress Notes (Signed)
Patient is medically stable for D/C to [facility] today. Per [facility] liaison patient can come today to room 601 after 2 pm. RN will call report to RN Theressa Stamps at [phone] and arrange EMS for transport. Clinical Education officer, museum (CSW) sent D/C orders to [facility] via [facility]. Patient is aware of above. CSW contacted patient's niece April and made her aware of above. Please reconsult if future social work needs arise. CSW signing off.   [employer], LCSW [phone]

## 2017-06-30 NOTE — Discharge Summary (Signed)
[facility] at [place] NAME: Charles Nelson    MR#:  [id number]  DATE OF BIRTH:  May 24, 1930  DATE OF ADMISSION:  06/27/2017 ADMITTING PHYSICIAN: Hillary Bow, MD  DATE OF DISCHARGE: 06/30/2017  PRIMARY CARE PHYSICIAN: Patient, No Pcp Per    ADMISSION DIAGNOSIS:  Acute pain of left shoulder [M25.512] Acute deep vein thrombosis (DVT) of other vein of left upper extremity (HCC) [I82.622]  DISCHARGE DIAGNOSIS:  Active Problems:   Confirmed venous thromboembolism (VTE)  SECONDARY DIAGNOSIS:   Past Medical History:  Diagnosis Date  . DVT (deep venous thrombosis) ([place])   . Insomnia   . Memory loss   . Neuropathy     HOSPITAL COURSE:   * Occlusive thrombus involving the left internal jugular vein and proximal left subclavian vein. This has occurred in spite of being on Eliquis. Likely due to recent trauma and immobility of the arm due to pain. consult vascular surgery.  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  *Dementia. Watch for inpatient delirium.  DISCHARGE CONDITIONS:   Stable.  CONSULTS OBTAINED:  Treatment Team:  Katha Cabal, MD  DRUG ALLERGIES:  No Known Allergies  DISCHARGE MEDICATIONS:   Current Discharge Medication List    START taking these medications   Details  celecoxib (CELEBREX) 100 MG capsule Take 1 capsule (100 mg total) by mouth 2 (two) times daily. Qty: 14 capsule, Refills: 0    polyethylene glycol (MIRALAX / GLYCOLAX) packet Take 17 g by mouth daily as needed for mild constipation. Qty: 14 each, Refills: 0      CONTINUE these medications which have CHANGED   Details  ELIQUIS STARTER PACK (ELIQUIS STARTER PACK) 5 MG TABS Take as directed on package: start with two-5mg  tablets twice daily for 7 days. On day 8, switch to one-5mg  tablet twice daily. Qty: 1 each, Refills: 0       CONTINUE these medications which have NOT CHANGED   Details  acetaminophen (TYLENOL) 500 MG tablet Take 1,000 mg by mouth 3 (three) times daily as needed for mild pain or moderate pain.     Cholecalciferol (VITAMIN D) 2000 units tablet Take 2,000 Units by mouth daily.    docusate sodium (COLACE) 100 MG capsule Take 100 mg by mouth 2 (two) times daily as needed for mild constipation.    furosemide (LASIX) 20 MG tablet Take 1 tablet (20 mg total) by mouth every other day. Qty: 30 tablet, Refills: 0    Melatonin 10 MG TABS Take 10 mg by mouth at bedtime.    risperiDONE (RISPERDAL) 2 MG tablet Take 2 mg by mouth at bedtime.    vitamin B-12 (CYANOCOBALAMIN) 1000 MCG tablet Take 2,000 mcg by mouth daily.      STOP taking these medications     traZODone (DESYREL) 100 MG tablet          DISCHARGE INSTRUCTIONS:    Follow with PMD in 1-2 weeks.  If you experience worsening of your admission symptoms, develop shortness of breath, life threatening emergency, suicidal or homicidal thoughts you must seek medical attention immediately by calling 911 or calling your MD immediately  if symptoms less severe.  You Must read complete instructions/literature along with all the possible adverse reactions/side effects for all the Medicines you take and that have been prescribed to you. Take any new Medicines after  you have completely understood and accept all the possible adverse reactions/side effects.   Please note  You were cared for by a hospitalist during your hospital stay. If you have any questions about your discharge medications or the care you received while you were in the hospital after you are discharged, you can call the unit and asked to speak with the hospitalist on call if the hospitalist that took care of you is not available. Once you are discharged, your primary care physician will handle any further medical issues. Please note that NO REFILLS for any discharge medications  will be authorized once you are discharged, as it is imperative that you return to your primary care physician (or establish a relationship with a primary care physician if you do not have one) for your aftercare needs so that they can reassess your need for medications and monitor your lab values.    Today   CHIEF COMPLAINT:   Chief Complaint  Patient presents with  . Shoulder Pain    left shoulder pain post fall 1 month ago    HISTORY OF PRESENT ILLNESS:  Charles Nelson  is a 83 y.o. 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  Patient had a fall 1 month back causing right hip and left shoulder pain.  It was seen in orthopedics office had intra-articular injection with improvement of pain.  Today he had recurrence of pain and was sent to the emergency room.  Here left upper extremity Dopplers show Occlusive thrombus involving the left internal jugular vein and proximal left subclavian vein. Patient is poor historian.  He does have history of dementia.  Does not remember his fall one month back. He is on Eliquis due to nonocclusive DVT of lower extremities for many months. Left shoulder x-ray is suspicious for chronic rotator cuff tear.   VITAL SIGNS:  Blood pressure (!) 148/67, pulse 62, temperature 98.2 F (36.8 C), temperature source Oral, resp. rate 18, height 6' (1.829 m), weight 79.9 kg (176 lb 1.6 oz), SpO2 96 %.  I/O:   Intake/Output Summary (Last 24 hours) at 06/30/17 1020 Last data filed at 06/30/17 0636  Gross per 24 hour  Intake              240 ml  Output              600 ml  Net             -360 ml    PHYSICAL EXAMINATION:   GENERAL: 83 y.o.-year-old patient lying in the bed with no acute distress.  EYES: Pupils equal, round, reactive to light and accommodation. No scleral icterus. Extraocular muscles intact.  HEENT: Head atraumatic,  normocephalic. Oropharynx and nasopharynx clear. No oropharyngeal erythema, moist oral mucosa  NECK: Supple, no jugular venous distention. No thyroid enlargement, no tenderness.  LUNGS: Normal breath sounds bilaterally, no wheezing, rales, rhonchi. No use of accessory muscles of respiration.  CARDIOVASCULAR: S1, S2 normal. No murmurs, rubs, or gallops.  ABDOMEN: Soft, nontender, nondistended. Bowel sounds present. No organomegaly or mass.  EXTREMITIES: No pedal edema, cyanosis, or clubbing. + 2 pedal &radial pulses b/l.  NEUROLOGIC: Cranial nerves II through XII are intact. No focal Motor or sensory deficits appreciated b/l PSYCHIATRIC: The patient is alert and awake. Not oriented to place or time SKIN: No obvious rash, lesion, or ulcer.   DATA  REVIEW:   CBC  Recent Labs Lab 06/30/17 0332  WBC 7.5  HGB 13.5  HCT 40.1  PLT 201    Chemistries   Recent Labs Lab 06/28/17 0312  NA 138  K 3.5  CL 106  CO2 28  GLUCOSE 110*  BUN 14  CREATININE 0.89  CALCIUM 8.3*    Cardiac Enzymes  Recent Labs Lab 06/27/17 2019  TROPONINI <0.03    Microbiology Results  No results found for this or any previous visit.  RADIOLOGY:  No results found.  EKG:   Orders placed or performed during the hospital encounter of 06/27/17  . ED EKG  . ED EKG  . EKG 12-Lead  . EKG 12-Lead      Management plans discussed with the patient, family and they are in agreement.  CODE STATUS:     Code Status Orders        Start     Ordered   06/27/17 2131  Full code  Continuous     06/27/17 2132    Code Status History    Date Active Date Inactive Code Status Order ID Comments User Context   08/31/2016  7:02 PM 09/03/2016  5:01 PM Full Code [id number]  Henreitta Leber, MD Inpatient      TOTAL TIME TAKING CARE OF THIS PATIENT: 35 minutes.    Vaughan Basta M.D on 06/30/2017 at 10:20 AM  Between 7am to 6pm - Pager - [phone]  After 6pm go to [email or web address] - password  [email or web address] [facility]  Office  [phone]  CC: Primary care physician; Patient, No Pcp Per   Note: This dictation was prepared with Dragon dictation along with smaller phrase technology. Any transcriptional errors that result from this process are unintentional.

## 2017-06-30 NOTE — Progress Notes (Signed)
Approx 1605, Legal Guardian, April G., updated that EMS has been called and patient is being transported to [facility].

## 2017-06-30 NOTE — Progress Notes (Signed)
Patient left the unit via stretcher with EMS. Patient belongings sent with patient.

## 2017-06-30 NOTE — Progress Notes (Signed)
Report called to [facility] and given to [employer], LPN.  Non-emergent transport called to take patient to [facility]. Patient made aware.

## 2017-06-30 NOTE — Clinical Social Work Placement (Signed)
   CLINICAL SOCIAL WORK PLACEMENT  NOTE  Date:  06/30/2017  Patient Details  Name: Charles Nelson MRN: [id number] Date of Birth: Jun 30, 1930  Clinical Social Work is seeking post-discharge placement for this patient at the [place] level of care (*CSW will initial, date and re-position this form in  chart as items are completed):  Yes   Patient/family provided with [facility] Work Department's list of facilities offering this level of care within the geographic area requested by the patient (or if unable, by the patient's family).  Yes   Patient/family informed of their freedom to choose among providers that offer the needed level of care, that participate in Medicare, Medicaid or managed care program needed by the patient, have an available bed and are willing to accept the patient.  Yes   Patient/family informed of [facility]'s ownership interest in [facility] and [facility], as well as of the fact that they are under no obligation to receive care at these facilities.  PASRR submitted to EDS on       PASRR number received on       Existing PASRR number confirmed on 06/29/17     FL2 transmitted to all facilities in geographic area requested by pt/family on 06/29/17     FL2 transmitted to all facilities within larger geographic area on       Patient informed that his/her managed care company has contracts with or will negotiate with certain facilities, including the following:        Yes   Patient/family informed of bed offers received.  Patient chooses bed at  ([facility] )     Physician recommends and patient chooses bed at      Patient to be transferred to  ([facility] ) on 06/30/17.  Patient to be transferred to facility by  [facility] )     Patient family notified on 06/30/17 of transfer.  Name of family member notified:   (Patient's niece April is aware of D/C today. )     PHYSICIAN       Additional Comment:     _______________________________________________ Kristjan Derner, Veronia Beets, LCSW 06/30/2017, 10:48 AM

## 2017-09-28 ENCOUNTER — Other Ambulatory Visit: Payer: Self-pay

## 2017-09-28 ENCOUNTER — Emergency Department
Admission: EM | Admit: 2017-09-28 | Discharge: 2017-09-28 | Disposition: A | Discharge status: Home / Self Care | Payer: Medicare Other | Attending: Student in an Organized Health Care Education/Training Program | Admitting: Student in an Organized Health Care Education/Training Program

## 2017-09-28 ENCOUNTER — Emergency Department: Payer: Medicare Other

## 2017-09-28 ENCOUNTER — Encounter: Payer: Self-pay | Admitting: *Deleted

## 2017-09-28 DIAGNOSIS — R2243 Localized swelling, mass and lump, lower limb, bilateral: Secondary | ICD-10-CM | POA: Diagnosis present

## 2017-09-28 DIAGNOSIS — Z79899 Other long term (current) drug therapy: Secondary | ICD-10-CM | POA: Insufficient documentation

## 2017-09-28 DIAGNOSIS — M7989 Other specified soft tissue disorders: Secondary | ICD-10-CM

## 2017-09-28 LAB — BASIC METABOLIC PANEL
Anion gap: 10 (ref 5–15)
BUN: 19 mg/dL (ref 6–20)
CO2: 25 mmol/L (ref 22–32)
Calcium: 8.9 mg/dL (ref 8.9–10.3)
Chloride: 105 mmol/L (ref 101–111)
Creatinine, Ser: 0.88 mg/dL (ref 0.61–1.24)
GFR calc Af Amer: >60 mL/min (ref >60)
GLUCOSE: 138 mg/dL — AB (ref 65–99)
POTASSIUM: 3.8 mmol/L (ref 3.5–5.1)
Sodium: 140 mmol/L (ref 135–145)

## 2017-09-28 LAB — CBC
HEMATOCRIT: 43 % (ref 40.0–52.0)
Hemoglobin: 14.1 g/dL (ref 13.0–18.0)
MCH: 30.4 pg (ref 26.0–34.0)
MCHC: 32.7 g/dL (ref 32.0–36.0)
MCV: 92.9 fL (ref 80.0–100.0)
Platelets: 247 10*3/uL (ref 150–440)
RBC: 4.63 MIL/uL (ref 4.40–5.90)
RDW: 14.9 % — AB (ref 11.5–14.5)
WBC: 9.1 10*3/uL (ref 3.8–10.6)

## 2017-09-28 LAB — TROPONIN I: Troponin I: <0.03 ng/mL (ref <0.03)

## 2017-09-28 NOTE — Discharge Instructions (Signed)
Your chest x-ray appeared clear and no signs of worsening heart failure.  I do recommend that you take double your Lasix dose (40mg ) daily for the next 2 days.  Follow-up with your PCP.  I do recommend keeping her legs elevated and wearing compression stockings.  Return for any additional questions or concerns.

## 2017-09-28 NOTE — ED Notes (Signed)
Family at bedside. 

## 2017-09-28 NOTE — ED Notes (Signed)
Patient transported to X-ray 

## 2017-09-28 NOTE — ED Provider Notes (Signed)
[facility] Emergency Department Provider Note    First MD Initiated Contact with Patient 09/28/17 2005     (approximate)  I have reviewed the triage vital signs and the nursing notes.   HISTORY  Chief Complaint Leg Swelling and Fever    HPI Nuri Larmer is a 84 y.o. male with a history of dementia CHF as well as DVT on Eliquis presents for worsening lower extremity swelling that improves after he raises his legs as well as low-grade temperature for the past week.  Is also having some congestion and itching in his right eye.  Patient was prescribed topical antibiotics for this by his PCP today.  Denies any chest pain or shortness of breath.  No orthopnea.  No abdominal pain nausea or vomiting.  Past Medical History:  Diagnosis Date  . DVT (deep venous thrombosis) ([place])   . Insomnia   . Memory loss   . Neuropathy    Family History  Problem Relation Age of Onset  . Diabetes Neg Hx   . Hypertension Neg Hx   . Deep vein thrombosis Neg Hx    Past Surgical History:  Procedure Laterality Date  . pt does not remember surgery hx     Patient Active Problem List   Diagnosis Date Noted  . Confirmed venous thromboembolism (VTE) 06/27/2017  . DVT (deep venous thrombosis) ([place]) 08/31/2016      Prior to Admission medications   Medication Sig Start Date End Date Taking? Authorizing Provider  acetaminophen (TYLENOL) 500 MG tablet Take 1,000 mg by mouth 3 (three) times daily as needed for mild pain or moderate pain.     [provider]  celecoxib (CELEBREX) 100 MG capsule Take 1 capsule (100 mg total) by mouth 2 (two) times daily. 06/30/17   Vaughan Basta, MD  Cholecalciferol (VITAMIN D) 2000 units tablet Take 2,000 Units by mouth daily.    [provider]  docusate sodium (COLACE) 100 MG capsule Take 100 mg by mouth 2 (two) times daily as needed for mild constipation.    [provider]  ELIQUIS STARTER PACK (ELIQUIS  STARTER PACK) 5 MG TABS Take as directed on package: start with two-5mg  tablets twice daily for 7 days. On day 8, switch to one-5mg  tablet twice daily. 06/30/17   Vaughan Basta, MD  furosemide (LASIX) 20 MG tablet Take 1 tablet (20 mg total) by mouth every other day. Patient taking differently: Take 20 mg by mouth 2 (two) times daily.  09/02/16 09/02/17  Gladstone Lighter, MD  Melatonin 10 MG TABS Take 10 mg by mouth at bedtime.    [provider]  polyethylene glycol (MIRALAX / GLYCOLAX) packet Take 17 g by mouth daily as needed for mild constipation. 06/30/17   Vaughan Basta, MD  risperiDONE (RISPERDAL) 2 MG tablet Take 2 mg by mouth at bedtime.    [provider]  vitamin B-12 (CYANOCOBALAMIN) 1000 MCG tablet Take 2,000 mcg by mouth daily.    [provider]    Allergies Patient has no known allergies.    Social History Social History   Tobacco Use  . Smoking status: Never Smoker  . Smokeless tobacco: Never Used  Substance Use Topics  . Alcohol use: No  . Drug use: No    Review of Systems Patient denies headaches, rhinorrhea, blurry vision, numbness, shortness of breath, chest pain, edema, cough, abdominal pain, nausea, vomiting, diarrhea, dysuria, fevers, rashes or hallucinations unless otherwise stated above in HPI. ____________________________________________   PHYSICAL  EXAM:  VITAL SIGNS: Vitals:   09/28/17 1530 09/28/17 1823  BP: 122/64 126/66  Pulse: 88 74  Resp: 16 20  Temp: 98.8 F (37.1 C) 99.3 F (37.4 C)  SpO2: 96% 96%    Constitutional: Alert  in no acute distress. Eyes: Conjunctivae are normal.  Head: Atraumatic. Nose: No congestion/rhinnorhea. Mouth/Throat: Mucous membranes are moist.   Neck: No stridor. Painless ROM.  Cardiovascular: Normal rate, regular rhythm. Grossly normal heart sounds.  Good peripheral circulation. Respiratory: Normal respiratory effort.  No retractions. Lungs CTAB. Gastrointestinal:  Soft and nontender. No distention. No abdominal bruits. No CVA tenderness. Genitourinary:  Musculoskeletal: No lower extremity tenderness , 2+ BLE edema.  No joint effusions.  No erythema or warmth Neurologic:  Normal speech and language. No gross focal neurologic deficits are appreciated. No facial droop Skin:  Skin is warm, dry and intact. No rash noted. Psychiatric: Speech and behavior are normal.  ____________________________________________   LABS (all labs ordered are listed, but only abnormal results are displayed)  Results for orders placed or performed during the hospital encounter of 09/28/17 (from the past 24 hour(s))  Basic metabolic panel     Status: Abnormal   Collection Time: 09/28/17  3:25 PM  Result Value Ref Range   Sodium 140 135 - 145 mmol/L   Potassium 3.8 3.5 - 5.1 mmol/L   Chloride 105 101 - 111 mmol/L   CO2 25 22 - 32 mmol/L   Glucose, Bld 138 (H) 65 - 99 mg/dL   BUN 19 6 - 20 mg/dL   Creatinine, Ser 0.88 0.61 - 1.24 mg/dL   Calcium 8.9 8.9 - 10.3 mg/dL   GFR calc non Af Amer >60 >60 mL/min   GFR calc Af Amer >60 >60 mL/min   Anion gap 10 5 - 15  CBC     Status: Abnormal   Collection Time: 09/28/17  3:25 PM  Result Value Ref Range   WBC 9.1 3.8 - 10.6 K/uL   RBC 4.63 4.40 - 5.90 MIL/uL   Hemoglobin 14.1 13.0 - 18.0 g/dL   HCT 43.0 40.0 - 52.0 %   MCV 92.9 80.0 - 100.0 fL   MCH 30.4 26.0 - 34.0 pg   MCHC 32.7 32.0 - 36.0 g/dL   RDW 14.9 (H) 11.5 - 14.5 %   Platelets 247 150 - 440 K/uL  Troponin I     Status: None   Collection Time: 09/28/17  3:25 PM  Result Value Ref Range   Troponin I <0.03 <0.03 ng/mL   ____________________________________________  ____________________________________________  RADIOLOGY  I personally reviewed all radiographic images ordered to evaluate for the above acute complaints and reviewed radiology reports and findings.  These findings were personally discussed with the patient.  Please see medical record for radiology  report.  ____________________________________________   PROCEDURES  Procedure(s) performed:  Procedures    Critical Care performed: no ____________________________________________   INITIAL IMPRESSION / ASSESSMENT AND PLAN / ED COURSE  Pertinent labs & imaging results that were available during my care of the patient were reviewed by me and considered in my medical decision making (see chart for details).  DDX: Edema, lymphedema, DVT, CHF, dehydration, URI, pneumonia  Jaegar Croft is a 84 y.o. who presents to the ED with lower extremity swelling as described above.  Patient pleasant well-appearing and in no acute distress.  Swelling seems to have gone down just with simply raising his legs throughout the day.  Complaining of some low-grade temperatures throughout the day and  week but no temperature here and has normal white count.  No evidence of sepsis.  Patient otherwise well-appearing and in no acute distress.  There is no evidence of heart failure.  Patient is already anticoagulated and shows no evidence of PE or acute DVT.  At this point I do believe patient is stable for outpatient follow-up with plan for doubling up on his Lasix for the next 2 days elevation and compression stockings.  Have discussed with the patient and available family all diagnostics and treatments performed thus far and all questions were answered to the best of my ability. The patient demonstrates understanding and agreement with plan.       ____________________________________________   FINAL CLINICAL IMPRESSION(S) / ED DIAGNOSES  Final diagnoses:  Leg swelling      NEW MEDICATIONS STARTED DURING THIS VISIT:  New Prescriptions   No medications on file     Note:  This document was prepared using Dragon voice recognition software and may include unintentional dictation errors.    Merlyn Lot, MD 09/28/17 2155

## 2017-09-28 NOTE — ED Notes (Signed)
ED Provider at bedside. 

## 2017-09-28 NOTE — ED Notes (Signed)
Pt to the ER for peripheral edema and a low grade fever. Pt has a history of edema. Is in a wheelchair most of the day but does walk with a walker short distances and is doing PT. Pt is on lasix. Pt has 2 plus pitting edema to the RLL mainly at the ankle and foot and 1 plus on the LLL.Denies pain except for the lateral ankle.

## 2017-09-28 NOTE — ED Triage Notes (Addendum)
Pt to ED from assisted living after family reports pt has been having intermittent leg swelling for the past 6 months with hx of blood clots but is currently taking blood thinners as prescribed. Bilateral leg swelling noted with +2 pitting edema. Pt was also sent to ED due to a fever over the past week.No cough or SOB reported. No abd pain or changes in urine reported.   Family reports their PCP sent pt to have him evaluated for intermittent fevers and to "assess the clots in his leg and chest"

## 2017-11-09 ENCOUNTER — Ambulatory Visit (INDEPENDENT_AMBULATORY_CARE_PROVIDER_SITE_OTHER): Payer: Medicare Other | Admitting: Internal Medicine

## 2017-11-09 ENCOUNTER — Encounter: Payer: Self-pay | Admitting: Internal Medicine

## 2017-11-09 VITALS — BP 118/70 | HR 72 | Ht 72.0 in | Wt 178.8 lb

## 2017-11-09 DIAGNOSIS — I82409 Acute embolism and thrombosis of unspecified deep veins of unspecified lower extremity: Secondary | ICD-10-CM

## 2017-11-09 DIAGNOSIS — I10 Essential (primary) hypertension: Secondary | ICD-10-CM | POA: Diagnosis not present

## 2017-11-09 DIAGNOSIS — M7989 Other specified soft tissue disorders: Secondary | ICD-10-CM | POA: Diagnosis not present

## 2017-11-09 DIAGNOSIS — Z86718 Personal history of other venous thrombosis and embolism: Secondary | ICD-10-CM | POA: Diagnosis not present

## 2017-11-09 DIAGNOSIS — E7800 Pure hypercholesterolemia, unspecified: Secondary | ICD-10-CM | POA: Diagnosis not present

## 2017-11-09 MED ORDER — FUROSEMIDE 20 MG PO TABS: 20.0000 mg | ORAL_TABLET | Freq: Every day | ORAL | 3 refills | Status: DC

## 2017-11-09 NOTE — Patient Instructions (Addendum)
Medication Instructions:  Your physician recommends that you continue on your current medications as directed. Please refer to the Current Medication list given to you today.   Labwork: Your physician recommends that you return for lab work in: [place] (BMET).   Testing/Procedures: Your physician has requested that you have an echocardiogram. Echocardiography is a painless test that uses sound waves to create images of your heart. It provides your doctor with information about the size and shape of your heart and how well your heart's chambers and valves are working. This procedure takes approximately one hour. There are no restrictions for this procedure.    Follow-Up: Your physician recommends that you schedule a follow-up appointment in: 1 MONTH WITH AN APP.  You will be provided with a prescription for knee high compression stockings at pressure of 15-20 mmHg. You can take this to a specialty pharmacy or medical supply store of your choice.   If you need a refill on your cardiac medications before your next appointment, please call your pharmacy.  Echocardiogram An echocardiogram, or echocardiography, uses sound waves (ultrasound) to produce an image of your heart. The echocardiogram is simple, painless, obtained within a short period of time, and offers valuable information to your health care provider. The images from an echocardiogram can provide information such as:  Evidence of coronary artery disease (CAD).  Heart size.  Heart muscle function.  Heart valve function.  Aneurysm detection.  Evidence of a past heart attack.  Fluid buildup around the heart.  Heart muscle thickening.  Assess heart valve function.  Tell a health care provider about:  Any allergies you have.  All medicines you are taking, including vitamins, herbs, eye drops, creams, and over-the-counter medicines.  Any problems you or family members have had with anesthetic medicines.  Any blood  disorders you have.  Any surgeries you have had.  Any medical conditions you have.  Whether you are pregnant or may be pregnant. What happens before the procedure? No special preparation is needed. Eat and drink normally. What happens during the procedure?  In order to produce an image of your heart, gel will be applied to your chest and a wand-like tool (transducer) will be moved over your chest. The gel will help transmit the sound waves from the transducer. The sound waves will harmlessly bounce off your heart to allow the heart images to be captured in real-time motion. These images will then be recorded.  You may need an IV to receive a medicine that improves the quality of the pictures. What happens after the procedure? You may return to your normal schedule including diet, activities, and medicines, unless your health care provider tells you otherwise. This information is not intended to replace advice given to you by your health care provider. Make sure you discuss any questions you have with your health care provider. Document Released: 08/13/2000 Document Revised: 04/03/2016 Document Reviewed: 04/23/2013 Elsevier Interactive Patient Education  2017 [employer].

## 2017-11-09 NOTE — Progress Notes (Signed)
New Outpatient Visit Date: 11/09/2017  Referring Provider: Lorelee Market, [place] [address]  Chief Complaint: Leg swelling  HPI:  Charles Nelson is a 84 y.o. male who is being seen today for the evaluation of leg swelling after recent ED visit by Dr. Brunetta Genera. He presented to the [facility] ED on 09/28/17 with increased leg swelling. 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  Leg swelling has been an intermittent problem for several years following diagnosis with bilateral DVTs while Charles Nelson was still living in [place].  He was initially treated with enoxaparin and warfarin, though he was subsequently transitioned to apixaban.  He recently moved to [place].  There were no clear precipitants for his recent leg swelling though he does not typically follow a low-sodium diet.  He has not been wearing compression stockings regularly.  He is not experience any pain in his legs nor chest pain, shortness of breath, palpitations, or lightheadedness.  Other than a "blood clot in his chest," Charles Nelson denies cardiac problems in the past.  He does not believe that he was ever evaluated by a cardiologist.  --------------------------------------------------------------------------------------------------  Cardiovascular History & Procedures: Cardiovascular Problems:  DVT and ? PE  Chronic lower extremity edema  Risk Factors:  Age  Cath/PCI:  None  CV Surgery:  None  EP Procedures and Devices:  None  Non-Invasive Evaluation(s):  TTE (08/31/16): Normal LV size and wall thickness. LVEF 60-65% with normal wall motion. Normal RV size and function. No significant valvular abnormalities.  Recent CV Pertinent Labs: Lab Results  Component Value Date   INR 1.08 06/29/2017   BNP 65.0 06/27/2017   K 3.9 11/09/2017   BUN 19 11/09/2017   CREATININE 0.92 11/09/2017     --------------------------------------------------------------------------------------------------  Past Medical History:  Diagnosis Date  . DVT (deep venous thrombosis) ([place])   . Insomnia   . Memory loss   . Neuropathy     Past Surgical History:  Procedure Laterality Date  . pt does not remember surgery hx      Current Meds  Medication Sig  . acetaminophen (TYLENOL) 500 MG tablet Take 1,000 mg by mouth 3 (three) times daily as needed for mild pain or moderate pain.   . Cholecalciferol (VITAMIN D) 2000 units tablet Take 2,000 Units by mouth daily.  Marland Kitchen docusate sodium (COLACE) 100 MG capsule Take 100 mg by mouth 2 (two) times daily as needed for mild constipation.  Marland Kitchen ELIQUIS STARTER PACK (ELIQUIS STARTER PACK) 5 MG TABS Take as directed on package: start with two-5mg  tablets twice daily for 7 days. On day 8, switch to one-5mg  tablet twice daily.  . furosemide (LASIX) 20 MG tablet Take 1 tablet (20 mg total) by mouth daily.  Marland Kitchen lisinopril (PRINIVIL,ZESTRIL) 10 MG tablet Take 10 mg by mouth daily.  . Melatonin 10 MG TABS Take 10 mg by mouth at bedtime.  . Multiple Vitamins-Minerals (THERAVIM-M) TABS Take 1 tablet by mouth daily.  . polyethylene glycol (MIRALAX / GLYCOLAX) packet Take 17 g by mouth daily as needed for mild constipation.  . potassium chloride (K-DUR) 10 MEQ tablet Take 10 mEq by mouth daily.  . pravastatin (PRAVACHOL) 40 MG tablet Take 40 mg by mouth daily.  . risperiDONE (RISPERDAL) 2 MG tablet Take 2 mg by mouth at bedtime.  Marland Kitchen tiZANidine (ZANAFLEX) 4 MG capsule Take 4 mg by mouth 3 (three) times daily.  Marland Kitchen  vitamin B-12 (CYANOCOBALAMIN) 1000 MCG tablet Take 2,000 mcg by mouth daily.  . vitamin C (ASCORBIC ACID) 500 MG tablet Take 500 mg by mouth 2 (two) times daily.  Marland Kitchen zinc sulfate 220 (50 Zn) MG capsule Take 220 mg by mouth daily.  . [DISCONTINUED] furosemide (LASIX) 20 MG tablet Take 20 mg by mouth daily.    Allergies: Patient has no known allergies.  Social  History   Socioeconomic History  . Marital status: Single    Spouse name: Not on file  . Number of children: Not on file  . Years of education: Not on file  . Highest education level: Not on file  Social Needs  . Financial resource strain: Not on file  . Food insecurity - worry: Not on file  . Food insecurity - inability: Not on file  . Transportation needs - medical: Not on file  . Transportation needs - non-medical: Not on file  Occupational History  . Not on file  Tobacco Use  . Smoking status: Never Smoker  . Smokeless tobacco: Never Used  Substance and Sexual Activity  . Alcohol use: No  . Drug use: No  . Sexual activity: Not on file  Other Topics Concern  . Not on file  Social History Narrative  . Not on file    Family History  Problem Relation Age of Onset  . Diabetes Neg Hx   . Hypertension Neg Hx   . Deep vein thrombosis Neg Hx     Review of Systems: A 12-system review of systems was performed and was negative except as noted in the HPI.  --------------------------------------------------------------------------------------------------  Physical Exam: BP 118/70 (BP Location: Left Arm, Patient Position: Sitting, Cuff Size: Normal)   Pulse 72   Ht 6' (1.829 m)   Wt 178 lb 12.8 oz (81.1 kg)   BMI 24.25 kg/m   General: Well-developed, well-nourished, elderly man, seated comfortably on the exam table.  He is accompanied by his longtime caregiver. HEENT: No conjunctival pallor or scleral icterus. Moist mucous membranes. OP clear. Neck: Supple without lymphadenopathy, thyromegaly, JVD, or HJR. No carotid bruit. Lungs: Normal work of breathing. Clear to auscultation bilaterally without wheezes or crackles. Heart: Regular rate and rhythm without murmurs, rubs, or gallops. Non-displaced PMI. Abd: Bowel sounds present. Soft, NT/ND without hepatosplenomegaly Ext: 1-2+ calf edema bilaterally. Radial, PT, and DP pulses are 2+ bilaterally Skin: Warm and dry without  rash. Neuro: CNIII-XII intact. Strength and fine-touch sensation intact in upper and lower extremities bilaterally. Psych: Normal mood and affect.  He has some difficulty answering questions and frequently defers to his caregiver.  EKG: Normal sinus rhythm with right bundle branch block and LVH.  Lab Results  Component Value Date   WBC 9.1 09/28/2017   HGB 14.1 09/28/2017   HCT 43.0 09/28/2017   MCV 92.9 09/28/2017   PLT 247 09/28/2017    Lab Results  Component Value Date   NA 145 (H) 11/09/2017   K 3.9 11/09/2017   CL 104 11/09/2017   CO2 25 11/09/2017   BUN 19 11/09/2017   CREATININE 0.92 11/09/2017   GLUCOSE 124 (H) 11/09/2017   ALT 13 (L) 06/07/2017    Outside lipid panel (06/23/17): Total cholesterol 244, triglycerides 89, HDL 68, LDL 158  --------------------------------------------------------------------------------------------------  ASSESSMENT AND PLAN: Chronic lower extremity edema Mild edema noted on exam today.  Patient is currently on furosemide 20 mg daily, which was started during his recent ED visit.  I will check a basic metabolic panel today  to ensure stable renal function and potassium.  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  Echocardiogram just over a year ago was largely unrevealing.  Given recent exacerbation, we have agreed to repeat an echocardiogram to ensure continued preservation of LV function as well as to exclude evidence of right heart failure.  If the echocardiogram is normal, I do not feel that any additional cardiovascular testing is indicated at this time.  I recommended sodium restriction as well as elevation of the legs when possible and use of compression stockings.  Prescription for compression stockings has been provided today.  Furosemide 20 mg daily can be continued, though if swelling is adequately controlled with nonpharmacologic measures,  noted above, I would favor using furosemide only on an as-needed basis.  History of DVTs This dates back several years to when Charles Nelson was still living in [place].  Both he and his caregiver deny any clear precipitants such as immobilization, recent surgery, or cancer.  Unfortunately, no records are available from Charles Nelson's hospitalization at [facility]  Records from Dr. Brunetta Genera also provide no details regarding this history.  I think it is reasonable to continue with apixaban 5 mg twice daily for unprovoked DVT.  If hypercoagulable workup has never been performed, I recommend that Dr. Brunetta Genera consider pursuing this or refer the patient to hematology.  Age-appropriate cancer screening is also recommended.  Hypercholesterolemia Lipid panel by Dr. Brunetta Genera in 05/2017 revealed an LDL of 158.  I recommend lifestyle modifications, given no personal history of ASCVD.  I think it is fine to continue pravastatin for now.  Hypertension Blood pressure is well controlled.  Continue lisinopril.  Follow-up: Return to clinic in 1 month.  Nelva Bush, MD 11/11/2017 10:54 AM

## 2017-11-09 NOTE — Progress Notes (Deleted)
New Outpatient Visit Date: 11/09/2017  Referring Provider: No referring provider defined for this encounter.  Chief Complaint: ***  HPI:  [name] is a 84 y.o. male who is being seen today for the evaluation of leg swelling after recent ED visit. He presented to the [facility] ED on 09/28/17 with increased leg swelling. 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  --------------------------------------------------------------------------------------------------  Cardiovascular History & Procedures: Cardiovascular Problems:  ***  Risk Factors:  ***  Cath/PCI:  ***  CV Surgery:  ***  EP Procedures and Devices:  ***  Non-Invasive Evaluation(s):  TTE (08/31/16): Normal LV size and wall thickness. LVEF 60-65% with normal wall motion. Normal RV size and function. No significant valvular abnormalities.  Recent CV Pertinent Labs: Lab Results  Component Value Date   INR 1.08 06/29/2017   BNP 65.0 06/27/2017   K 3.8 09/28/2017   BUN 19 09/28/2017   CREATININE 0.88 09/28/2017    --------------------------------------------------------------------------------------------------  Past Medical History:  Diagnosis Date  . DVT (deep venous thrombosis) ([place])   . Insomnia   . Memory loss   . Neuropathy     Past Surgical History:  Procedure Laterality Date  . pt does not remember surgery hx      No outpatient medications have been marked as taking for the 11/09/17 encounter (Appointment) with Charles Nelson, Charles Gave, MD.    Allergies: Patient has no known allergies.  Social History   Socioeconomic History  . Marital status: Single    Spouse name: Not on file  . Number of children: Not on file  . Years of education: Not on file  . Highest education level: Not on file  Social Needs  . Financial resource strain: Not on file  . Food insecurity - worry: Not on file  . Food insecurity -  inability: Not on file  . Transportation needs - medical: Not on file  . Transportation needs - non-medical: Not on file  Occupational History  . Not on file  Tobacco Use  . Smoking status: Never Smoker  . Smokeless tobacco: Never Used  Substance and Sexual Activity  . Alcohol use: No  . Drug use: No  . Sexual activity: Not on file  Other Topics Concern  . Not on file  Social History Narrative  . Not on file    Family History  Problem Relation Age of Onset  . Diabetes Neg Hx   . Hypertension Neg Hx   . Deep vein thrombosis Neg Hx     Review of Systems: A 12-system review of systems was performed and was negative except as noted in the HPI.  --------------------------------------------------------------------------------------------------  Physical Exam: There were no vitals taken for this visit.  General:  *** HEENT: No conjunctival pallor or scleral icterus. Moist mucous membranes. OP clear. Neck: Supple without lymphadenopathy, thyromegaly, JVD, or HJR. No carotid bruit. Lungs: Normal work of breathing. Clear to auscultation bilaterally without wheezes or crackles. Heart: Regular rate and rhythm without murmurs, rubs, or gallops. Non-displaced PMI. Abd: Bowel sounds present. Soft, NT/ND without hepatosplenomegaly Ext: No lower extremity edema. Radial, PT, and DP pulses are 2+ bilaterally Skin: Warm and dry without rash. Neuro: CNIII-XII intact. Strength and fine-touch sensation intact in upper and lower extremities bilaterally. Psych: Normal mood and affect.  EKG:  ***  Lab Results  Component Value Date   WBC 9.1 09/28/2017   HGB 14.1 09/28/2017   HCT 43.0 09/28/2017  MCV 92.9 09/28/2017   PLT 247 09/28/2017    Lab Results  Component Value Date   NA 140 09/28/2017   K 3.8 09/28/2017   CL 105 09/28/2017   CO2 25 09/28/2017   BUN 19 09/28/2017   CREATININE 0.88 09/28/2017   GLUCOSE 138 (H) 09/28/2017   ALT 13 (L) 06/07/2017    No results found for:  CHOL, HDL, LDLCALC, LDLDIRECT, TRIG, CHOLHDL   --------------------------------------------------------------------------------------------------  ASSESSMENT AND PLAN: ***  Nelva Bush, MD 11/09/2017 1:20 PM

## 2017-11-10 LAB — BASIC METABOLIC PANEL
BUN / CREAT RATIO: 21 (ref 10–24)
BUN: 19 mg/dL (ref 8–27)
CO2: 25 mmol/L (ref 20–29)
CREATININE: 0.92 mg/dL (ref 0.76–1.27)
Calcium: 9 mg/dL (ref 8.6–10.2)
Chloride: 104 mmol/L (ref 96–106)
GFR, EST AFRICAN AMERICAN: 87 mL/min/{1.73_m2} (ref >59)
GFR, EST NON AFRICAN AMERICAN: 76 mL/min/{1.73_m2} (ref >59)
Glucose: 124 mg/dL — ABNORMAL HIGH (ref 65–99)
POTASSIUM: 3.9 mmol/L (ref 3.5–5.2)
SODIUM: 145 mmol/L — AB (ref 134–144)

## 2017-11-11 ENCOUNTER — Encounter: Payer: Self-pay | Admitting: Internal Medicine

## 2017-11-11 DIAGNOSIS — I10 Essential (primary) hypertension: Secondary | ICD-10-CM | POA: Insufficient documentation

## 2017-11-11 DIAGNOSIS — E7800 Pure hypercholesterolemia, unspecified: Secondary | ICD-10-CM | POA: Insufficient documentation

## 2017-11-11 DIAGNOSIS — M7989 Other specified soft tissue disorders: Secondary | ICD-10-CM | POA: Insufficient documentation

## 2017-12-08 ENCOUNTER — Other Ambulatory Visit: Payer: Medicare Other

## 2017-12-12 ENCOUNTER — Other Ambulatory Visit: Payer: Self-pay | Admitting: Internal Medicine

## 2017-12-12 DIAGNOSIS — I509 Heart failure, unspecified: Secondary | ICD-10-CM

## 2017-12-12 DIAGNOSIS — M7989 Other specified soft tissue disorders: Secondary | ICD-10-CM

## 2017-12-15 ENCOUNTER — Other Ambulatory Visit: Payer: Medicare Other

## 2017-12-19 ENCOUNTER — Ambulatory Visit: Payer: Medicare Other | Admitting: Nurse Practitioner

## 2017-12-24 ENCOUNTER — Other Ambulatory Visit: Payer: Self-pay

## 2017-12-24 ENCOUNTER — Emergency Department: Payer: Medicare Other

## 2017-12-24 ENCOUNTER — Emergency Department
Admission: EM | Admit: 2017-12-24 | Discharge: 2017-12-24 | Disposition: A | Discharge status: Skilled Nursing Facility | Payer: Medicare Other | Attending: Emergency Medicine | Admitting: Emergency Medicine

## 2017-12-24 DIAGNOSIS — Z79899 Other long term (current) drug therapy: Secondary | ICD-10-CM | POA: Insufficient documentation

## 2017-12-24 DIAGNOSIS — S51011A Laceration without foreign body of right elbow, initial encounter: Secondary | ICD-10-CM

## 2017-12-24 DIAGNOSIS — W51XXXA Accidental striking against or bumped into by another person, initial encounter: Secondary | ICD-10-CM | POA: Insufficient documentation

## 2017-12-24 DIAGNOSIS — R413 Other amnesia: Secondary | ICD-10-CM | POA: Diagnosis not present

## 2017-12-24 DIAGNOSIS — Y999 Unspecified external cause status: Secondary | ICD-10-CM | POA: Diagnosis not present

## 2017-12-24 DIAGNOSIS — Y939 Activity, unspecified: Secondary | ICD-10-CM | POA: Insufficient documentation

## 2017-12-24 DIAGNOSIS — S59901A Unspecified injury of right elbow, initial encounter: Secondary | ICD-10-CM | POA: Diagnosis present

## 2017-12-24 DIAGNOSIS — I10 Essential (primary) hypertension: Secondary | ICD-10-CM | POA: Insufficient documentation

## 2017-12-24 DIAGNOSIS — S41101A Unspecified open wound of right upper arm, initial encounter: Secondary | ICD-10-CM | POA: Diagnosis not present

## 2017-12-24 DIAGNOSIS — Y929 Unspecified place or not applicable: Secondary | ICD-10-CM | POA: Insufficient documentation

## 2017-12-24 DIAGNOSIS — Z7901 Long term (current) use of anticoagulants: Secondary | ICD-10-CM | POA: Diagnosis not present

## 2017-12-24 DIAGNOSIS — W19XXXA Unspecified fall, initial encounter: Secondary | ICD-10-CM

## 2017-12-24 NOTE — ED Notes (Signed)
Called number for [facility], [phone]. No answer left message. Called pt POA April [phone], no answer either.

## 2017-12-24 NOTE — ED Provider Notes (Signed)
IMPRESSION: No evidence of intracranial injury.Stable senescent changes.   CT scan as dictated above, he is cleared for outpatient follow-up.   Earleen Newport, MD 12/24/17 [phone]

## 2017-12-24 NOTE — ED Notes (Signed)
Manuela Schwartz RN called numbers again in attempt to get in contact with someone to pick pt up and take him home. No answer this time either. Voicemail left.

## 2017-12-24 NOTE — ED Triage Notes (Signed)
Pt arrives via [facility] from [address] (assisted living). The facility had taken residents outside. Pt was walking, walked into someone and fell. On eliquis. Skin tear to R elbow. No other bleeding noted. States he hit his head.

## 2017-12-24 NOTE — ED Notes (Signed)
Pt urinated into urinal but missed a little bit. Changed brief and linen. Warm blanket provided at this time.

## 2017-12-24 NOTE — ED Notes (Signed)
Skin tear on rt elbow cleaned and bandaid applied.

## 2017-12-24 NOTE — ED Provider Notes (Signed)
[facility] Emergency Department Provider Note  ___________________________________________   First MD Initiated Contact with Patient 12/24/17 1417     (approximate)  I have reviewed the triage vital signs and the nursing notes.   HISTORY  Chief Complaint Fall   HPI Kalden Wanke is a 84 y.o. male with a history of DVT on Eliquis who is presenting after a fall.  He was outside at his facility when he collided with another resident, sustaining a skin tear to his posterior right elbow as well as abrasions to the right side of his head.  He was sent in for further evaluation.  When asked he says "everything hurts."  However, it appears this may be a chronic issue.  Reports having a tetanus shot within the last 5 years.  Does not report any focal weakness.  Past Medical History:  Diagnosis Date  . DVT (deep venous thrombosis) ([place])   . Insomnia   . Memory loss   . Neuropathy     Patient Active Problem List   Diagnosis Date Noted  . Leg swelling 11/11/2017  . Essential hypertension 11/11/2017  . Pure hypercholesterolemia 11/11/2017  . Confirmed venous thromboembolism (VTE) 06/27/2017  . DVT (deep venous thrombosis) ([place]) 08/31/2016    Past Surgical History:  Procedure Laterality Date  . pt does not remember surgery hx      Prior to Admission medications   Medication Sig Start Date End Date Taking? Authorizing Provider  acetaminophen (TYLENOL) 500 MG tablet Take 1,000 mg by mouth 3 (three) times daily as needed for mild pain or moderate pain.     [provider]  Cholecalciferol (VITAMIN D) 2000 units tablet Take 2,000 Units by mouth daily.    [provider]  docusate sodium (COLACE) 100 MG capsule Take 100 mg by mouth 2 (two) times daily as needed for mild constipation.    [provider]  ELIQUIS STARTER PACK (ELIQUIS STARTER PACK) 5 MG TABS Take as directed on package: start with two-5mg  tablets twice daily for 7 days.  On day 8, switch to one-5mg  tablet twice daily. 06/30/17   Vaughan Basta, MD  furosemide (LASIX) 20 MG tablet Take 1 tablet (20 mg total) by mouth daily. 11/09/17   End, Harrell Gave, MD  lisinopril (PRINIVIL,ZESTRIL) 10 MG tablet Take 10 mg by mouth daily.    [provider]  Melatonin 10 MG TABS Take 10 mg by mouth at bedtime.    [provider]  Multiple Vitamins-Minerals (THERAVIM-M) TABS Take 1 tablet by mouth daily.    [provider]  polyethylene glycol (MIRALAX / GLYCOLAX) packet Take 17 g by mouth daily as needed for mild constipation. 06/30/17   Vaughan Basta, MD  potassium chloride (K-DUR) 10 MEQ tablet Take 10 mEq by mouth daily.    [provider]  pravastatin (PRAVACHOL) 40 MG tablet Take 40 mg by mouth daily.    [provider]  risperiDONE (RISPERDAL) 2 MG tablet Take 2 mg by mouth at bedtime.    [provider]  tiZANidine (ZANAFLEX) 4 MG capsule Take 4 mg by mouth 3 (three) times daily.    [provider]  vitamin B-12 (CYANOCOBALAMIN) 1000 MCG tablet Take 2,000 mcg by mouth daily.    [provider]  vitamin C (ASCORBIC ACID) 500 MG tablet Take 500 mg by mouth 2 (two) times daily.    [provider]  zinc sulfate 220 (50 Zn) MG capsule Take 220 mg by mouth daily.  [provider]    Allergies Patient has no known allergies.  Family History  Problem Relation Age of Onset  . Diabetes Neg Hx   . Hypertension Neg Hx   . Deep vein thrombosis Neg Hx     Social History Social History   Tobacco Use  . Smoking status: Never Smoker  . Smokeless tobacco: Never Used  Substance Use Topics  . Alcohol use: No  . Drug use: No    Review of Systems  Constitutional: No fever/chills Eyes: No visual changes. ENT: No sore throat. Cardiovascular: Denies chest pain. Respiratory: Denies shortness of breath. Gastrointestinal: No abdominal pain.  No nausea, no vomiting.  No  diarrhea.  No constipation. Genitourinary: Negative for dysuria. Musculoskeletal: Negative for back pain. Skin: Negative for rash. Neurological: Negative for headaches, focal weakness or numbness.   ____________________________________________   PHYSICAL EXAM:  VITAL SIGNS: ED Triage Vitals [12/24/17 1413]  Enc Vitals Group     BP 140/66     Pulse Rate 66     Resp (!) 9     Temp 98.9 F (37.2 C)     Temp Source Oral     SpO2 98 %     Weight 185 lb (83.9 kg)     Height 6' (1.829 m)     Head Circumference      Peak Flow      Pain Score 0     Pain Loc      Pain Edu?      Excl. in [place]?     Constitutional: Alert and oriented. Well appearing and in no acute distress. Eyes: Conjunctivae are normal.  Head: Atraumatic.  Small abrasions overlying the right side of the face just anterior to the ear without any active bleeding.  No obvious laceration or hematoma to the skull. Nose: No congestion/rhinnorhea. Mouth/Throat: Mucous membranes are moist.  Neck: No stridor.  No tenderness to palpation to the cervical spine.  No deformity or step-off. Cardiovascular: Normal rate, regular rhythm. Grossly normal heart sounds.   Respiratory: Normal respiratory effort.  No retractions. Lungs CTAB. Gastrointestinal: Soft and nontender. No distention. No CVA tenderness. Musculoskeletal: No lower extremity tenderness.  Mild to moderate bilateral lower extremity edema which is symmetric.  No joint effusions.  No tenderness to palpation of the bilateral hips.  No limb shortening.  5 out of 5 bilateral lower extremity strength.  2 cm superficial skin tear over the right elbow that is superficial and without any active bleeding.  Bilateral 5 out of 5 strength of the upper extremities and active full range of motion without any restriction.  No tenderness to palpation over the chest wall.  Neurologic:  Normal speech and language. No gross focal neurologic deficits are appreciated. Skin:  Skin is warm, dry  and intact. No rash noted. Psychiatric: Mood and affect are normal. Speech and behavior are normal.  ____________________________________________   LABS (all labs ordered are listed, but only abnormal results are displayed)  Labs Reviewed - No data to display ____________________________________________  EKG   ____________________________________________  RADIOLOGY  Pending CT head. ____________________________________________   PROCEDURES  Procedure(s) performed:   Procedures  Critical Care performed:   ____________________________________________   INITIAL IMPRESSION / ASSESSMENT AND PLAN / ED COURSE  Pertinent labs & imaging results that were available during my care of the patient were reviewed by me and considered in my medical decision making (see chart for details).  DDX: Abrasion, skin tear, skull fracture, epidural hematoma, subdural hematoma, contusion  of the cerebrum As part of my medical decision making, I reviewed the following data within the [facility] Notes from prior ED visits  ----------------------------------------- 3:28 PM on 12/24/2017 -----------------------------------------  Patient pending CT brain at this time.  Signed out to Dr. Jimmye Norman. ____________________________________________   FINAL CLINICAL IMPRESSION(S) / ED DIAGNOSES  Fall.  Skin tear.    NEW MEDICATIONS STARTED DURING THIS VISIT:  New Prescriptions   No medications on file     Note:  This document was prepared using Dragon voice recognition software and may include unintentional dictation errors.     Orbie Pyo, MD 12/24/17 [phone]

## 2017-12-31 ENCOUNTER — Emergency Department: Payer: Medicare Other

## 2017-12-31 ENCOUNTER — Inpatient Hospital Stay
Admission: EM | Admit: 2017-12-31 | Discharge: 2018-01-03 | DRG: 871 | Disposition: A | Discharge status: Skilled Nursing Facility | Payer: Medicare Other | Attending: Internal Medicine | Admitting: Internal Medicine

## 2017-12-31 ENCOUNTER — Other Ambulatory Visit: Payer: Self-pay

## 2017-12-31 DIAGNOSIS — I959 Hypotension, unspecified: Secondary | ICD-10-CM | POA: Diagnosis present

## 2017-12-31 DIAGNOSIS — G9341 Metabolic encephalopathy: Secondary | ICD-10-CM | POA: Diagnosis present

## 2017-12-31 DIAGNOSIS — F0390 Unspecified dementia without behavioral disturbance: Secondary | ICD-10-CM | POA: Diagnosis present

## 2017-12-31 DIAGNOSIS — E872 Acidosis: Secondary | ICD-10-CM | POA: Diagnosis present

## 2017-12-31 DIAGNOSIS — I10 Essential (primary) hypertension: Secondary | ICD-10-CM | POA: Diagnosis present

## 2017-12-31 DIAGNOSIS — R55 Syncope and collapse: Secondary | ICD-10-CM

## 2017-12-31 DIAGNOSIS — Z7901 Long term (current) use of anticoagulants: Secondary | ICD-10-CM | POA: Diagnosis not present

## 2017-12-31 DIAGNOSIS — Z66 Do not resuscitate: Secondary | ICD-10-CM | POA: Diagnosis present

## 2017-12-31 DIAGNOSIS — Z86711 Personal history of pulmonary embolism: Secondary | ICD-10-CM | POA: Diagnosis not present

## 2017-12-31 DIAGNOSIS — A419 Sepsis, unspecified organism: Principal | ICD-10-CM | POA: Diagnosis present

## 2017-12-31 DIAGNOSIS — E7800 Pure hypercholesterolemia, unspecified: Secondary | ICD-10-CM | POA: Diagnosis present

## 2017-12-31 DIAGNOSIS — K922 Gastrointestinal hemorrhage, unspecified: Secondary | ICD-10-CM

## 2017-12-31 DIAGNOSIS — Z86718 Personal history of other venous thrombosis and embolism: Secondary | ICD-10-CM | POA: Diagnosis not present

## 2017-12-31 DIAGNOSIS — Z79899 Other long term (current) drug therapy: Secondary | ICD-10-CM | POA: Diagnosis not present

## 2017-12-31 LAB — CBC
HCT: 43.3 % (ref 40.0–52.0)
Hemoglobin: 14.4 g/dL (ref 13.0–18.0)
MCH: 31.3 pg (ref 26.0–34.0)
MCHC: 33.2 g/dL (ref 32.0–36.0)
MCV: 94.3 fL (ref 80.0–100.0)
PLATELETS: 277 10*3/uL (ref 150–440)
RBC: 4.6 MIL/uL (ref 4.40–5.90)
RDW: 14.2 % (ref 11.5–14.5)
WBC: 19 10*3/uL — ABNORMAL HIGH (ref 3.8–10.6)

## 2017-12-31 LAB — LACTIC ACID, PLASMA
Lactic Acid, Venous: 3.9 mmol/L (ref 0.5–1.9)
Lactic Acid, Venous: 1.9 mmol/L (ref 0.5–1.9)

## 2017-12-31 LAB — BASIC METABOLIC PANEL
Anion gap: 12 (ref 5–15)
BUN: 17 mg/dL (ref 6–20)
CALCIUM: 9 mg/dL (ref 8.9–10.3)
CO2: 24 mmol/L (ref 22–32)
CREATININE: 1.22 mg/dL (ref 0.61–1.24)
Chloride: 101 mmol/L (ref 101–111)
GFR calc non Af Amer: 52 mL/min — ABNORMAL LOW (ref >60)
Glucose, Bld: 181 mg/dL — ABNORMAL HIGH (ref 65–99)
Potassium: 4.1 mmol/L (ref 3.5–5.1)
SODIUM: 137 mmol/L (ref 135–145)

## 2017-12-31 LAB — URINALYSIS, COMPLETE (UACMP) WITH MICROSCOPIC
BILIRUBIN URINE: NEGATIVE
Bacteria, UA: NONE SEEN
Glucose, UA: NEGATIVE mg/dL
HGB URINE DIPSTICK: NEGATIVE
Ketones, ur: NEGATIVE mg/dL
LEUKOCYTES UA: NEGATIVE
Nitrite: NEGATIVE
PH: 5 (ref 5.0–8.0)
Protein, ur: NEGATIVE mg/dL
Specific Gravity, Urine: 1.011 (ref 1.005–1.030)
Squamous Epithelial / LPF: NONE SEEN (ref 0–5)

## 2017-12-31 LAB — HEPATIC FUNCTION PANEL
ALK PHOS: 83 U/L (ref 38–126)
ALT: 20 U/L (ref 17–63)
AST: 32 U/L (ref 15–41)
Albumin: 3.7 g/dL (ref 3.5–5.0)
BILIRUBIN TOTAL: 0.7 mg/dL (ref 0.3–1.2)
Bilirubin, Direct: <0.1 mg/dL — ABNORMAL LOW (ref 0.1–0.5)
Total Protein: 6.9 g/dL (ref 6.5–8.1)

## 2017-12-31 LAB — PROTIME-INR
INR: 1.13
PROTHROMBIN TIME: 14.4 s (ref 11.4–15.2)

## 2017-12-31 LAB — BRAIN NATRIURETIC PEPTIDE: B Natriuretic Peptide: 50 pg/mL (ref 0.0–100.0)

## 2017-12-31 LAB — TROPONIN I: Troponin I: <0.03 ng/mL (ref <0.03)

## 2017-12-31 LAB — TYPE AND SCREEN
ABO/RH(D): A POS
ANTIBODY SCREEN: NEGATIVE

## 2017-12-31 LAB — MRSA PCR SCREENING: MRSA by PCR: NEGATIVE

## 2017-12-31 LAB — GLUCOSE, CAPILLARY
GLUCOSE-CAPILLARY: 86 mg/dL (ref 65–99)
GLUCOSE-CAPILLARY: 110 mg/dL — AB (ref 65–99)

## 2017-12-31 LAB — LIPASE, BLOOD: LIPASE: 31 U/L (ref 11–51)

## 2017-12-31 MED ORDER — VANCOMYCIN HCL IN DEXTROSE 1-5 GM/200ML-% IV SOLN
1000.0000 mg | Freq: Once | INTRAVENOUS | Status: AC
  Administered 2017-12-31: 1000 mg via INTRAVENOUS
  Filled 2017-12-31: qty 200

## 2017-12-31 MED ORDER — SODIUM CHLORIDE 0.9 % IV BOLUS
500.0000 mL | Freq: Once | INTRAVENOUS | Status: AC
  Administered 2017-12-31: 500 mL via INTRAVENOUS

## 2017-12-31 MED ORDER — SODIUM CHLORIDE 0.9 % IV SOLN
INTRAVENOUS | Status: DC
  Administered 2017-12-31 – 2018-01-01 (×3): via INTRAVENOUS

## 2017-12-31 MED ORDER — ALBUTEROL SULFATE (2.5 MG/3ML) 0.083% IN NEBU: 2.5000 mg | INHALATION_SOLUTION | RESPIRATORY_TRACT | Status: DC | PRN

## 2017-12-31 MED ORDER — ONDANSETRON HCL 4 MG PO TABS: 4.0000 mg | ORAL_TABLET | Freq: Four times a day (QID) | ORAL | Status: DC | PRN

## 2017-12-31 MED ORDER — BISACODYL 5 MG PO TBEC: 5.0000 mg | DELAYED_RELEASE_TABLET | Freq: Every day | ORAL | Status: DC | PRN

## 2017-12-31 MED ORDER — VANCOMYCIN HCL 10 G IV SOLR
1250.0000 mg | INTRAVENOUS | Status: DC
  Administered 2017-12-31: 1250 mg via INTRAVENOUS
  Filled 2017-12-31 (×2): qty 1250

## 2017-12-31 MED ORDER — SENNOSIDES-DOCUSATE SODIUM 8.6-50 MG PO TABS: 1.0000 | ORAL_TABLET | Freq: Every evening | ORAL | Status: DC | PRN

## 2017-12-31 MED ORDER — ACETAMINOPHEN 325 MG PO TABS
650.0000 mg | ORAL_TABLET | Freq: Four times a day (QID) | ORAL | Status: DC | PRN
  Administered 2018-01-01: 650 mg via ORAL
  Filled 2017-12-31: qty 2

## 2017-12-31 MED ORDER — ONDANSETRON HCL 4 MG/2ML IJ SOLN: 4.0000 mg | Freq: Four times a day (QID) | INTRAMUSCULAR | Status: DC | PRN

## 2017-12-31 MED ORDER — PANTOPRAZOLE SODIUM 40 MG IV SOLR: 40.0000 mg | Freq: Two times a day (BID) | INTRAVENOUS | Status: DC

## 2017-12-31 MED ORDER — PANTOPRAZOLE SODIUM 40 MG IV SOLR
8.0000 mg/h | INTRAVENOUS | Status: DC
  Administered 2017-12-31 – 2018-01-02 (×3): 8 mg/h via INTRAVENOUS
  Filled 2017-12-31 (×5): qty 80

## 2017-12-31 MED ORDER — PIPERACILLIN-TAZOBACTAM 3.375 G IVPB
3.3750 g | Freq: Three times a day (TID) | INTRAVENOUS | Status: DC
  Administered 2017-12-31 – 2018-01-02 (×6): 3.375 g via INTRAVENOUS
  Filled 2017-12-31 (×6): qty 50

## 2017-12-31 MED ORDER — PIPERACILLIN-TAZOBACTAM 3.375 G IVPB 30 MIN
3.3750 g | Freq: Once | INTRAVENOUS | Status: AC
  Administered 2017-12-31: 3.375 g via INTRAVENOUS
  Filled 2017-12-31: qty 50

## 2017-12-31 MED ORDER — HYDROCODONE-ACETAMINOPHEN 5-325 MG PO TABS
1.0000 | ORAL_TABLET | ORAL | Status: DC | PRN
  Administered 2017-12-31 (×2): 2 via ORAL
  Filled 2017-12-31 (×2): qty 2

## 2017-12-31 MED ORDER — ACETAMINOPHEN 650 MG RE SUPP: 650.0000 mg | Freq: Four times a day (QID) | RECTAL | Status: DC | PRN

## 2017-12-31 NOTE — ED Provider Notes (Signed)
[facility] Emergency Department Provider Note   ____________________________________________   First MD Initiated Contact with Patient 12/31/17 [phone]     (approximate)  I have reviewed the triage vital signs and the nursing notes.   HISTORY  Chief Complaint Loss of Consciousness  Level 5 caveat: History limited by dementia  HPI Charles Nelson is a 84 y.o. male brought to the ED from group home via EMS status post syncopal episode.  EMS reports patient was found slumped over a rail near the toilet by the group home staff.  He arrives to the ED alert only to self, with a skin tear to his right hand and diaphoretic.  He was recently seen 1 week ago in the ED for mechanical fall.  Looks like he takes Eliquis for DVT.  Rest of history is unobtainable secondary to dementia.   Past Medical History:  Diagnosis Date  . DVT (deep venous thrombosis) ([place])   . Insomnia   . Memory loss   . Neuropathy     Patient Active Problem List   Diagnosis Date Noted  . Leg swelling 11/11/2017  . Essential hypertension 11/11/2017  . Pure hypercholesterolemia 11/11/2017  . Confirmed venous thromboembolism (VTE) 06/27/2017  . DVT (deep venous thrombosis) ([place]) 08/31/2016    Past Surgical History:  Procedure Laterality Date  . pt does not remember surgery hx      Prior to Admission medications   Medication Sig Start Date End Date Taking? Authorizing Provider  acetaminophen (TYLENOL) 500 MG tablet Take 1,000 mg by mouth 3 (three) times daily as needed for mild pain or moderate pain.     [provider]  Cholecalciferol (VITAMIN D) 2000 units tablet Take 2,000 Units by mouth daily.    [provider]  docusate sodium (COLACE) 100 MG capsule Take 100 mg by mouth 2 (two) times daily as needed for mild constipation.    [provider]  ELIQUIS STARTER PACK (ELIQUIS STARTER PACK) 5 MG TABS Take as directed on package: start with two-5mg  tablets twice  daily for 7 days. On day 8, switch to one-5mg  tablet twice daily. 06/30/17   Vaughan Basta, MD  furosemide (LASIX) 20 MG tablet Take 1 tablet (20 mg total) by mouth daily. 11/09/17   End, Harrell Gave, MD  lisinopril (PRINIVIL,ZESTRIL) 10 MG tablet Take 10 mg by mouth daily.    [provider]  Melatonin 10 MG TABS Take 10 mg by mouth at bedtime.    [provider]  Multiple Vitamins-Minerals (THERAVIM-M) TABS Take 1 tablet by mouth daily.    [provider]  polyethylene glycol (MIRALAX / GLYCOLAX) packet Take 17 g by mouth daily as needed for mild constipation. 06/30/17   Vaughan Basta, MD  potassium chloride (K-DUR) 10 MEQ tablet Take 10 mEq by mouth daily.    [provider]  pravastatin (PRAVACHOL) 40 MG tablet Take 40 mg by mouth daily.    [provider]  risperiDONE (RISPERDAL) 2 MG tablet Take 2 mg by mouth at bedtime.    [provider]  tiZANidine (ZANAFLEX) 4 MG capsule Take 4 mg by mouth 3 (three) times daily.    [provider]  vitamin B-12 (CYANOCOBALAMIN) 1000 MCG tablet Take 2,000 mcg by mouth daily.    [provider]  vitamin C (ASCORBIC ACID) 500 MG tablet Take 500 mg by mouth 2 (two) times daily.    [provider]  zinc sulfate 220 (50 Zn) MG capsule Take 220 mg  by mouth daily.    [provider]    Allergies Patient has no known allergies.  Family History  Problem Relation Age of Onset  . Diabetes Neg Hx   . Hypertension Neg Hx   . Deep vein thrombosis Neg Hx     Social History Social History   Tobacco Use  . Smoking status: Never Smoker  . Smokeless tobacco: Never Used  Substance Use Topics  . Alcohol use: No  . Drug use: No    Review of Systems  Constitutional: No fever/chills. Eyes: No visual changes. ENT: No sore throat. Cardiovascular: Denies chest pain. Respiratory: Denies shortness of breath. Gastrointestinal: No abdominal pain.  No nausea,  no vomiting.  No diarrhea.  No constipation. Genitourinary: Negative for dysuria. Musculoskeletal: Negative for back pain. Skin: Negative for rash. Neurological: Positive for syncope.  Negative for headaches, focal weakness or numbness.  10 point review of systems limited secondary to patient's dementia ____________________________________________   PHYSICAL EXAM:  VITAL SIGNS: ED Triage Vitals  Enc Vitals Group     BP 12/31/17 0618 90/65     Pulse Rate 12/31/17 0618 68     Resp 12/31/17 0618 14     Temp 12/31/17 0618 98.7 F (37.1 C)     Temp Source 12/31/17 0618 Oral     SpO2 12/31/17 0618 94 %     Weight 12/31/17 0620 180 lb (81.6 kg)     Height 12/31/17 0620 6' (1.829 m)     Head Circumference --      Peak Flow --      Pain Score 12/31/17 0620 0     Pain Loc --      Pain Edu? --      Excl. in [place]? --     Constitutional: Alert and oriented.  Frail appearing and in mild acute distress. Eyes: Conjunctivae are normal. PERRL. EOMI. Head: Atraumatic. Nose: No deformity. Mouth/Throat: Mucous membranes are mildly dry.  No dental malocclusion. Neck: No stridor.  No cervical spine tenderness to palpation. Cardiovascular: Tachycardic rate, regular rhythm. Grossly normal heart sounds.  Good peripheral circulation. Respiratory: Normal respiratory effort.  No retractions. Lungs diminished bibasilarly. Gastrointestinal: Soft and nontender to light or deep palpation. No distention. No abdominal bruits. No CVA tenderness. Musculoskeletal: No lower extremity tenderness nor edema.  No joint effusions. Neurologic: Alert and oriented to self only.  Normal speech and language. No gross focal neurologic deficits are appreciated. MAEx4. Skin:  Skin is pale, warm, dry and intact. No rash noted.  Old ecchymosis to right posterior shoulder, hip and buttock from recent fall.  Fresh skin tear to right hand. Psychiatric: Mood and affect are normal. Speech and behavior are  normal.  ____________________________________________   LABS (all labs ordered are listed, but only abnormal results are displayed)  Labs Reviewed  BASIC METABOLIC PANEL - Abnormal; Notable for the following components:      Result Value   Glucose, Bld 181 (*)    GFR calc non Af Amer 52 (*)    All other components within normal limits  CBC - Abnormal; Notable for the following components:   WBC 19.0 (*)    All other components within normal limits  TROPONIN I  URINALYSIS, COMPLETE (UACMP) WITH MICROSCOPIC  PROTIME-INR  LACTIC ACID, PLASMA  LACTIC ACID, PLASMA  HEPATIC FUNCTION PANEL  LIPASE, BLOOD  BRAIN NATRIURETIC PEPTIDE  TYPE AND SCREEN   ____________________________________________  EKG  ED ECG REPORT I, Kynzlee Hucker J, the attending physician, personally viewed  and interpreted this ECG.   Date: 12/31/2017  EKG Time: 0620  Rate: 115  Rhythm: sinus tachycardia  Axis: Normal  Intervals:right bundle branch block  ST&T Change: Nonspecific  ____________________________________________  RADIOLOGY  ED MD interpretation: No acute cardiopulmonary process  Personally reviewed CT head; no ICH noted  Official radiology report(s): Dg Chest Port 1 View  Result Date: 12/31/2017 CLINICAL DATA:  Syncope. EXAM: PORTABLE CHEST 1 VIEW COMPARISON:  Chest x-ray dated September 28, 2017. FINDINGS: The patient is rotated to the right. The heart size and mediastinal contours are within normal limits. Normal pulmonary vascularity. No focal consolidation, pleural effusion, or pneumothorax. Unchanged calcified granuloma at the left lung base. No acute osseous abnormality. IMPRESSION: No active disease. Electronically Signed   By: Titus Dubin M.D.   On: 12/31/2017 07:03    ____________________________________________   PROCEDURES  Procedure(s) performed:   Rectal exam: External exam within normal limits without hemorrhoid.  Dark stool on gloved finger which is immediately heme  positive.  Procedures  Critical Care performed: Yes, see critical care note(s)   CRITICAL CARE Performed by: Paulette Blanch   Total critical care time: 30 minutes  Critical care time was exclusive of separately billable procedures and treating other patients.  Critical care was necessary to treat or prevent imminent or life-threatening deterioration.  Critical care was time spent personally by me on the following activities: development of treatment plan with patient and/or surrogate as well as nursing, discussions with consultants, evaluation of patient's response to treatment, examination of patient, obtaining history from patient or surrogate, ordering and performing treatments and interventions, ordering and review of laboratory studies, ordering and review of radiographic studies, pulse oximetry and re-evaluation of patient's condition.  ____________________________________________   INITIAL IMPRESSION / ASSESSMENT AND PLAN / ED COURSE  As part of my medical decision making, I reviewed the following data within the [facility] notes reviewed and incorporated, Labs reviewed, EKG interpreted, Old chart reviewed, Radiograph reviewed and Notes from prior ED visits   84 year old male from the group home with a history of hypertension, hyperlipidemia, on Eliquis for DVT who was found slumped over near the commode; probable syncopal episode.  Differential diagnosis includes but is not limited to intracranial hemorrhage, syncope, CAD, CVA/TIA, GI bleed, etc.  Patient is tachycardic and hypertensive on arrival.  Rectal temperature 99.5 F.  Guaiac positive stool.  Will place second PIV, type and screen.  Will obtain screening lab work including troponin, CT head to evaluate for intracranial hemorrhage, chest x-ray.  Initiate IV fluid resuscitation.  Anticipate hospitalization.  Clinical Course as of Dec 31 728  Sat Dec 31, 2017  0727 Blood pressure improved with IV  fluids.  Noted leukocytosis.  Discussed with hospitalist who will evaluate patient in the emergency department for concerns of syncope and probable upper GI bleed.   [JS]    Clinical Course User Index [JS] Paulette Blanch, MD     ____________________________________________   FINAL CLINICAL IMPRESSION(S) / ED DIAGNOSES  Final diagnoses:  Syncope, unspecified syncope type  Gastrointestinal hemorrhage, unspecified gastrointestinal hemorrhage type  Hypotension, unspecified hypotension type     ED Discharge Orders    None       Note:  This document was prepared using Dragon voice recognition software and may include unintentional dictation errors.    Paulette Blanch, MD 12/31/17 0730

## 2017-12-31 NOTE — ED Notes (Signed)
Notified lab that unable to print patient labels due to chart being locked for [employer], spoke with Gwen. Informed her that the blood cultures and repeat lactic acid would be coming with chart labels. Per Meredith Mody that was ok.

## 2017-12-31 NOTE — ED Triage Notes (Signed)
fsbs with ems 261

## 2017-12-31 NOTE — H&P (Signed)
[facility] NAME: Charles Nelson    MR#:  [id number]  DATE OF BIRTH:  1929-11-14  DATE OF ADMISSION:  12/31/2017  PRIMARY CARE PHYSICIAN: Lorelee Market, MD   REQUESTING/REFERRING PHYSICIAN: Paulette Blanch, MD  CHIEF COMPLAINT:   Chief Complaint  Patient presents with  . Loss of Consciousness   Loss of consciousness HISTORY OF PRESENT ILLNESS:  Charles Nelson  is a 84 y.o. male with a known history of hypertension, DVT, PE, memory loss and neuropathy.  The patient was sent from ALF due to loss of consciousness yesterday.  The patient is very confused, looks demented.  Per ED physician, the patient was found slumped over a rail near the toilet by the group home staff.  He arrives to the ED alert only to self, with a skin tear to his right hand and diaphoretic.  He was recently seen 1 week ago in the ED for mechanical fall. The patient was found hypotension at 84/70 and tachycardia.  He was given normal saline bolus.  Blood pressure is better.  He was found leukocytosis and lactic acidosis, was treated with antibiotics in the ED.  Blood culture was sent.  Stool occult is positive. PAST MEDICAL HISTORY:   Past Medical History:  Diagnosis Date  . DVT (deep venous thrombosis) ([place])   . Insomnia   . Memory loss   . Neuropathy     PAST SURGICAL HISTORY:   Past Surgical History:  Procedure Laterality Date  . pt does not remember surgery hx      SOCIAL HISTORY:   Social History   Tobacco Use  . Smoking status: Never Smoker  . Smokeless tobacco: Never Used  Substance Use Topics  . Alcohol use: No    FAMILY HISTORY:   Family History  Problem Relation Age of Onset  . Diabetes Neg Hx   . Hypertension Neg Hx   . Deep vein thrombosis Neg Hx     DRUG ALLERGIES:  No Known Allergies  REVIEW OF SYSTEMS:   Review of Systems  Unable to perform ROS: Dementia    MEDICATIONS AT HOME:   Prior to Admission medications     Medication Sig Start Date End Date Taking? Authorizing Provider  acetaminophen (TYLENOL) 500 MG tablet Take 1,000 mg by mouth 3 (three) times daily as needed for mild pain or moderate pain.     [provider]  Cholecalciferol (VITAMIN D) 2000 units tablet Take 2,000 Units by mouth daily.    [provider]  docusate sodium (COLACE) 100 MG capsule Take 100 mg by mouth 2 (two) times daily as needed for mild constipation.    [provider]  ELIQUIS STARTER PACK (ELIQUIS STARTER PACK) 5 MG TABS Take as directed on package: start with two-5mg  tablets twice daily for 7 days. On day 8, switch to one-5mg  tablet twice daily. 06/30/17   Vaughan Basta, MD  furosemide (LASIX) 20 MG tablet Take 1 tablet (20 mg total) by mouth daily. 11/09/17   End, Harrell Gave, MD  lisinopril (PRINIVIL,ZESTRIL) 10 MG tablet Take 10 mg by mouth daily.    [provider]  Melatonin 10 MG TABS Take 10 mg by mouth at bedtime.    [provider]  Multiple Vitamins-Minerals (THERAVIM-M) TABS Take 1 tablet by mouth daily.    [provider]  polyethylene glycol (MIRALAX / GLYCOLAX) packet Take 17 g by mouth daily as needed for mild constipation. 06/30/17  Vaughan Basta, MD  potassium chloride (K-DUR) 10 MEQ tablet Take 10 mEq by mouth daily.    [provider]  pravastatin (PRAVACHOL) 40 MG tablet Take 40 mg by mouth daily.    [provider]  risperiDONE (RISPERDAL) 2 MG tablet Take 2 mg by mouth at bedtime.    [provider]  tiZANidine (ZANAFLEX) 4 MG capsule Take 4 mg by mouth 3 (three) times daily.    [provider]  vitamin B-12 (CYANOCOBALAMIN) 1000 MCG tablet Take 2,000 mcg by mouth daily.    [provider]  vitamin C (ASCORBIC ACID) 500 MG tablet Take 500 mg by mouth 2 (two) times daily.    [provider]  zinc sulfate 220 (50 Zn) MG capsule Take 220 mg by mouth daily.    [provider]       VITAL SIGNS:  Blood pressure (!) 133/110, pulse 78, temperature 99.5 F (37.5 C), temperature source Rectal, resp. rate 18, height 6' (1.829 m), weight 180 lb (81.6 kg), SpO2 100 %.  PHYSICAL EXAMINATION:  Physical Exam  GENERAL:  84 y.o.-year-old patient lying in the bed with no acute distress.  EYES: Pupils equal, round, reactive to light and accommodation. No scleral icterus. Extraocular muscles intact.  HEENT: Head atraumatic, normocephalic. Oropharynx and nasopharynx clear.  NECK:  Supple, no jugular venous distention. No thyroid enlargement, no tenderness.  LUNGS: Normal breath sounds bilaterally, no wheezing, rales,rhonchi or crepitation. No use of accessory muscles of respiration.  CARDIOVASCULAR: S1, S2 normal. No murmurs, rubs, or gallops.  ABDOMEN: Soft, nontender, nondistended. Bowel sounds present. No organomegaly or mass.  EXTREMITIES: No pedal edema, cyanosis, or clubbing.  NEUROLOGIC: Cranial nerves II through XII are intact. Muscle strength 4/5 in all extremities. Sensation intact. Gait not checked.  PSYCHIATRIC: The patient is confused and demented, only knows his name. SKIN: No obvious rash, lesion, or ulcer.  But has bruises on extremities.  LABORATORY PANEL:   CBC Recent Labs  Lab 12/31/17 0626  WBC 19.0*  HGB 14.4  HCT 43.3  PLT 277   ------------------------------------------------------------------------------------------------------------------  Chemistries  Recent Labs  Lab 12/31/17 0626  NA 137  K 4.1  CL 101  CO2 24  GLUCOSE 181*  BUN 17  CREATININE 1.22  CALCIUM 9.0   ------------------------------------------------------------------------------------------------------------------  Cardiac Enzymes Recent Labs  Lab 12/31/17 0626  TROPONINI <0.03   ------------------------------------------------------------------------------------------------------------------  RADIOLOGY:  Ct Head Wo Contrast  Result Date:  12/31/2017 CLINICAL DATA:  Altered level of consciousness.  Unexplained. EXAM: CT HEAD WITHOUT CONTRAST TECHNIQUE: Contiguous axial images were obtained from the base of the skull through the vertex without intravenous contrast. COMPARISON:  CT head without contrast 12/24/2017 FINDINGS: Brain: Atrophy and extensive white matter disease is similar the prior exam. No acute infarct, hemorrhage, or mass lesion is present. Brainstem and cerebellum are normal. Ventricles are proportionate to the degree of atrophy. There is no significant white matter disease. Vascular: Atherosclerotic calcifications are present within the cavernous internal carotid arteries bilaterally. There is no hyperdense vessel or significant interval change. Skull: Calvarium is intact. No focal lytic or blastic lesions are present. No significant extracranial soft tissue injury is present. Sinuses/Orbits: Mucosal thickening is present the posterior ethmoid air cells bilaterally. A small polyp or mucous retention cyst is noted anteriorly and laterally in the left sphenoid sinus. A soft tissue polyp is noted along the right side of the nasal septum. The mastoid air cells are clear. Bilateral lens replacements are present. Globes and orbits are  within normal limits otherwise. IMPRESSION: 1. No acute intracranial abnormality or significant interval change. 2. Stable atrophy and white matter disease. This likely reflects the sequela of chronic microvascular ischemia. 3. Atherosclerosis. 4. Stable mild sinus disease and probable nonobstructing nasal polyp. Electronically Signed   By: San Morelle M.D.   On: 12/31/2017 07:47   Dg Chest Port 1 View  Result Date: 12/31/2017 CLINICAL DATA:  Syncope. EXAM: PORTABLE CHEST 1 VIEW COMPARISON:  Chest x-ray dated September 28, 2017. FINDINGS: The patient is rotated to the right. The heart size and mediastinal contours are within normal limits. Normal pulmonary vascularity. No focal consolidation, pleural  effusion, or pneumothorax. Unchanged calcified granuloma at the left lung base. No acute osseous abnormality. IMPRESSION: No active disease. Electronically Signed   By: Titus Dubin M.D.   On: 12/31/2017 07:03      IMPRESSION AND PLAN:   Sepsis.  Meet sepsis criteria.  The patient has tachycardia, hypotension, tachypnea, leukocytosis and lactic acidosis.  Unclear etiology. The patient will be admitted to medical floor. Start sepsis protocol.  Vancomycin and Zosyn pharmacy to dose, follow-up CBC and cultures.  Continue IV fluid support.  Follow-up lactic acid level.  Syncope, possible due to hypotension. Hold hypertension medication, IV fluids support.  Check orthostatic vital signs.  Hypotension.  The patient was given normal saline bolus in ED.  Blood pressure improved to normal range.  Hold hypertension medication.  Lactic acidosis.  Follow-up level, urinalysis and blood culture. Leukocytosis.  Follow-up urinalysis to rule out UTI, follow-up CBC and cultures.  Acute metabolic encephalopathy due to sepsis or dementia. Aspiration the fall precaution.  GI bleeding.  Hold Eliquis and GI consult.  Follow hemoglobin. History of PE and DVT.  Hold Eliquis.  All the records are reviewed and case discussed with ED provider. Management plans discussed with the patient's legal guardian, Ms. Lesly Rubenstein and they are in agreement.  CODE STATUS: Full code.  TOTAL TIME TAKING CARE OF THIS PATIENT: 52 minutes.    Demetrios Loll M.D on 12/31/2017 at 9:36 AM  Between 7am to 6pm - Pager - [phone]  After 6pm go to [email or web address] - Proofreader  [facility]  Office  [phone]  CC: Primary care physician; Lorelee Market, MD   Note: This dictation was prepared with Dragon dictation along with smaller phrase technology. Any transcriptional errors that result from this process are unin

## 2017-12-31 NOTE — Consult Note (Signed)
Charles Nelson , MD [address], [place], [place], [place], [address] [address], [place], [place], [place], [address] Phone: [phone]  Fax: [phone]  Consultation  Referring Provider:Dr Bridgett Larsson  Primary Care Physician:  Lorelee Market, MD Primary Gastroenterologist: None         Reason for Consultation: GI bleed   Date of Admission:  12/31/2017 Date of Consultation:  12/31/2017         HPI:   Charles Nelson is a 84 y.o. male admitted on 12/31/2017 with loss of consciousness.  He has a past medical history of hypertension DVT pulmonary embolism memory loss.  On admission at the ER he was only alerted to self.  Found to be hypotensive and tachycardic.  Presently being treated for sepsis.  He is on Eliquis.  I have been consulted for anemia.  On admission his hemoglobin is 14.4 g with an INR of 1.13 with a creatinine of 1.22 normal BUN hepatic function was normal lactic acid was elevated at 3.9.  I have been consulted for a GI bleed with a normal hemoglobin and no evidence of anemia.  He is unable to give any history - appears comfortable , having his lunch  Past Medical History:  Diagnosis Date  . DVT (deep venous thrombosis) ([place])   . Insomnia   . Memory loss   . Neuropathy     Past Surgical History:  Procedure Laterality Date  . pt does not remember surgery hx      Prior to Admission medications   Medication Sig Start Date End Date Taking? Authorizing Provider  acetaminophen (TYLENOL) 500 MG tablet Take 1,000 mg by mouth 3 (three) times daily as needed for mild pain or moderate pain.    Yes [provider]  Cholecalciferol (VITAMIN D) 2000 units tablet Take 2,000 Units by mouth daily.   Yes [provider]  docusate sodium (COLACE) 100 MG capsule Take 100 mg by mouth 2 (two) times daily as needed for mild constipation.   Yes [provider]  ELIQUIS STARTER PACK (ELIQUIS STARTER PACK) 5 MG TABS Take as directed on package: start with two-5mg   tablets twice daily for 7 days. On day 8, switch to one-5mg  tablet twice daily. Patient taking differently: Take 5 mg by mouth 2 (two) times daily.  06/30/17  Yes Vaughan Basta, MD  furosemide (LASIX) 20 MG tablet Take 1 tablet (20 mg total) by mouth daily. 11/09/17  Yes End, Harrell Gave, MD  lisinopril (PRINIVIL,ZESTRIL) 10 MG tablet Take 10 mg by mouth daily.   Yes [provider]  Melatonin 10 MG TABS Take 10 mg by mouth at bedtime.   Yes [provider]  Multiple Vitamins-Minerals (THERAVIM-M) TABS Take 1 tablet by mouth daily.   Yes [provider]  polyethylene glycol (MIRALAX / GLYCOLAX) packet Take 17 g by mouth daily as needed for mild constipation. 06/30/17  Yes Vaughan Basta, MD  potassium chloride (K-DUR) 10 MEQ tablet Take 10 mEq by mouth daily.   Yes [provider]  pravastatin (PRAVACHOL) 40 MG tablet Take 40 mg by mouth at bedtime.    Yes [provider]  risperiDONE (RISPERDAL) 2 MG tablet Take 2 mg by mouth at bedtime.   Yes [provider]  tiZANidine (ZANAFLEX) 4 MG capsule Take 4 mg by mouth 3 (three) times daily.   Yes [provider]  vitamin B-12 (CYANOCOBALAMIN) 1000 MCG tablet Take 2,000 mcg by mouth daily.   Yes [provider]  vitamin C (ASCORBIC ACID) 500 MG tablet Take 500 mg by mouth 2 (two) times daily.   Yes [provider]  zinc sulfate 220 (50 Zn) MG capsule Take 220 mg by mouth daily.   Yes [provider]    Family History  Problem Relation Age of Onset  . Diabetes Neg Hx   . Hypertension Neg Hx   . Deep vein thrombosis Neg Hx      Social History   Tobacco Use  . Smoking status: Never Smoker  . Smokeless tobacco: Never Used  Substance Use Topics  . Alcohol use: No  . Drug use: No    Allergies as of 12/31/2017  . (No Known Allergies)    Review of Systems:    Unable to provide ROS   Physical Exam:  Vital signs in last 24 hours: Temp:   [98.7 F (37.1 C)-99.5 F (37.5 C)] 99 F (37.2 C) (05/04 1018) Pulse Rate:  [46-116] 86 (05/04 1018) Resp:  [14-25] 16 (05/04 1018) BP: (84-149)/(56-110) 149/72 (05/04 1018) SpO2:  [94 %-100 %] 100 % (05/04 1018) Weight:  [180 lb (81.6 kg)] 180 lb (81.6 kg) (05/04 0620)   General: appears comfortable  Head:  Normocephalic and atraumatic. Eyes:   No icterus.   Conjunctiva pink. PERRLA. Ears:  Normal auditory acuity. Neck:  Supple; no masses or thyroidomegaly Lungs: Respirations even and unlabored. Lungs clear to auscultation bilaterally.   No wheezes, crackles, or rhonchi.  Heart:  Regular rate and rhythm;  Without murmur, clicks, rubs or gallops Abdomen:  Soft, nondistended, nontender. Normal bowel sounds. No appreciable masses or hepatomegaly.  No rebound or guarding.  Neurologic:  Alert and oriented x3;  grossly normal neurologically. Skin:  Intact without significant lesions or rashes. Cervical Nodes:  No significant cervical adenopathy. Psych: Ax0 x0  LAB RESULTS: Recent Labs    12/31/17 0626  WBC 19.0*  HGB 14.4  HCT 43.3  PLT 277   BMET Recent Labs    12/31/17 0626  NA 137  K 4.1  CL 101  CO2 24  GLUCOSE 181*  BUN 17  CREATININE 1.22  CALCIUM 9.0   LFT Recent Labs    12/31/17 0626  PROT 6.9  ALBUMIN 3.7  AST 32  ALT 20  ALKPHOS 83  BILITOT 0.7  BILIDIR <0.1*  IBILI NOT CALCULATED   PT/INR Recent Labs    12/31/17 0626  LABPROT 14.4  INR 1.13    STUDIES: Ct Head Wo Contrast  Result Date: 12/31/2017 CLINICAL DATA:  Altered level of consciousness.  Unexplained. EXAM: CT HEAD WITHOUT CONTRAST TECHNIQUE: Contiguous axial images were obtained from the base of the skull through the vertex without intravenous contrast. COMPARISON:  CT head without contrast 12/24/2017 FINDINGS: Brain: Atrophy and extensive white matter disease is similar the prior exam. No acute infarct, hemorrhage, or mass lesion is present. Brainstem and cerebellum are normal.  Ventricles are proportionate to the degree of atrophy. There is no significant white matter disease. Vascular: Atherosclerotic calcifications are present within the cavernous internal carotid arteries bilaterally. There is no hyperdense vessel or significant interval change. Skull: Calvarium is intact. No focal lytic or blastic lesions are present. No significant extracranial soft tissue injury is present. Sinuses/Orbits: Mucosal thickening is present the posterior ethmoid air cells bilaterally. A small polyp or mucous retention cyst is noted anteriorly and laterally in the left sphenoid sinus. A soft tissue polyp is noted along the right side of the nasal septum. The mastoid air cells are  clear. Bilateral lens replacements are present. Globes and orbits are within normal limits otherwise. IMPRESSION: 1. No acute intracranial abnormality or significant interval change. 2. Stable atrophy and white matter disease. This likely reflects the sequela of chronic microvascular ischemia. 3. Atherosclerosis. 4. Stable mild sinus disease and probable nonobstructing nasal polyp. Electronically Signed   By: San Morelle M.D.   On: 12/31/2017 07:47   Dg Chest Port 1 View  Result Date: 12/31/2017 CLINICAL DATA:  Syncope. EXAM: PORTABLE CHEST 1 VIEW COMPARISON:  Chest x-ray dated September 28, 2017. FINDINGS: The patient is rotated to the right. The heart size and mediastinal contours are within normal limits. Normal pulmonary vascularity. No focal consolidation, pleural effusion, or pneumothorax. Unchanged calcified granuloma at the left lung base. No acute osseous abnormality. IMPRESSION: No active disease. Electronically Signed   By: Titus Dubin M.D.   On: 12/31/2017 07:03      Impression / Plan:   Tobe Kervin is a 84 y.o. y/o male admitted with loss of consciousness sepsis .  On admission hemoglobin of 13.1 g and I have been consulted for a GI bleed.  The patient is on Eliquis at home.  With no evidence of  anemia and no overt blood loss at this point of time no further GI evaluation is required.  In general stool occult testing should not be performed inpatient for any reason. The  test is performed for colorectal cancer screening.  A positive test does not rule out a GI bleed nor ruled in the GI bleed and a negative test does not rule out any GI bleed either.  A positive test suggest presence of blood in the stool and  does not indicate a GI bleed.  Suggest to closely monitor the CBC and if there is evidence of anemia to consult me back.   I will sign off.  Please call me if any further GI concerns or questions.  We would like to thank you for the opportunity to participate in the care of Charles Nelson.    Thank you for involving me in the care of this patient.      LOS: 0 days   Charles Bellows, MD  12/31/2017, 10:50 AM

## 2017-12-31 NOTE — ED Notes (Addendum)
Right hand skin tear cleansed and dressed prior to ct transport. Pt with bruising in multiple stages of color to bilateral arms, right shoulder, right posterior upper leg. Pt with scabbed lacerations and abrasions to bilateral forearms.

## 2017-12-31 NOTE — ED Triage Notes (Signed)
Pt from a group home "on [address] " per ems. Per ems pt was found slumped over rail near toilet by group home staff. Pt with skin tear to right hand, pt with diaphoresis noted. Pt is only alert to self.

## 2017-12-31 NOTE — ED Notes (Signed)
Report to ashley, rn

## 2017-12-31 NOTE — Plan of Care (Signed)
Pt admitted from ED with GIB and sepsis. Pt seems to be in considerable pain but is unable to give number on pain scale. Will assess via FACES and PAINAID scale. Per caregiver pt takes pain meds for chronic pain. Multiple skin tears noted, this RN cleansed and dressed them. Pt in incont.

## 2017-12-31 NOTE — Progress Notes (Signed)
Pharmacy Antibiotic Note  Charles Nelson is a 84 y.o. male admitted on 12/31/2017 with sepsis.  Pharmacy has been consulted for vancomycin and Zosyn dosing. Source is unkown.  Plan: Zosyn 3.375 g EI q 8 hours.   Vancomycin 1000 mg iv once followed by 1250 mg iv q 18 hours with stacked dosing. Goal trough: 15-20 mcg/ml.   Height: 6' (182.9 cm) Weight: 180 lb (81.6 kg) IBW/kg (Calculated) : 77.6  Temp (24hrs), Avg:99.1 F (37.3 C), Min:98.7 F (37.1 C), Max:99.5 F (37.5 C)  Recent Labs  Lab 12/31/17 0626 12/31/17 0651 12/31/17 0840  WBC 19.0*  --   --   CREATININE 1.22  --   --   LATICACIDVEN  --  3.9* 1.9    Estimated Creatinine Clearance: 48.6 mL/min (by C-G formula based on SCr of 1.22 mg/dL).    No Known Allergies  Antimicrobials this admission: Zosyn 5/4 >>  vancomycin 5/4 >>   Dose adjustments this admission:   Microbiology results: 5/4 BCx: sent  Thank you for allowing pharmacy to be a part of this patient's care.  Charles Nelson 12/31/2017 11:26 AM

## 2017-12-31 NOTE — ED Notes (Signed)
Dr. Bridgett Larsson notified of pt's lactic acid of 3.9 and that no blood cultures were ordered. New order received to draw blood cultures before atbx.  Informed Dr. Bridgett Larsson that UA was already sent as well.

## 2017-12-31 NOTE — Progress Notes (Signed)
Advanced Care Plan.  Purpose of Encounter: Code status and prognosis Parties in Attendance: the patient, legal guardian MS. Goggings and me. Patient's Decisional Capacity: No Medical Story: Charles Nelson  is a 84 y.o. male with a known history of hypertension, DVT, PE, memory loss and neuropathy.  He is being admitted for sepsis, acute metabolic encephalopathy and syncope.  I discussed with his legal guardian about his current condition, poor prognosis and the colder status.  Per his legal guardian, the patient's condition has been declining recently.  He is seen DNR status. Plan:  Code Status: DNR. Time spent discussing advance care planning: 18 minutes.

## 2017-12-31 NOTE — ED Notes (Signed)
Pt returned from CT °

## 2017-12-31 NOTE — ED Notes (Signed)
Patient transported to CT 

## 2017-12-31 NOTE — ED Notes (Signed)
Spoke with patient's legal guardian April [phone], pt resides are [facility].  Notified her that patient would be admitted to the hospital.

## 2018-01-01 LAB — BASIC METABOLIC PANEL
Anion gap: 5 (ref 5–15)
BUN: 13 mg/dL (ref 6–20)
CALCIUM: 8 mg/dL — AB (ref 8.9–10.3)
CO2: 27 mmol/L (ref 22–32)
Chloride: 108 mmol/L (ref 101–111)
Creatinine, Ser: 0.87 mg/dL (ref 0.61–1.24)
GFR calc Af Amer: >60 mL/min (ref >60)
Glucose, Bld: 99 mg/dL (ref 65–99)
POTASSIUM: 3.7 mmol/L (ref 3.5–5.1)
SODIUM: 140 mmol/L (ref 135–145)

## 2018-01-01 LAB — CBC
HEMATOCRIT: 34.7 % — AB (ref 40.0–52.0)
Hemoglobin: 11.9 g/dL — ABNORMAL LOW (ref 13.0–18.0)
MCH: 32.3 pg (ref 26.0–34.0)
MCHC: 34.3 g/dL (ref 32.0–36.0)
MCV: 94.2 fL (ref 80.0–100.0)
PLATELETS: 206 10*3/uL (ref 150–440)
RBC: 3.69 MIL/uL — ABNORMAL LOW (ref 4.40–5.90)
RDW: 14.2 % (ref 11.5–14.5)
WBC: 9.4 10*3/uL (ref 3.8–10.6)

## 2018-01-01 NOTE — Progress Notes (Signed)
[facility] [place] NAME: Charles Nelson    MR#:  [id number]  DATE OF BIRTH:  07-17-1930  SUBJECTIVE:  CHIEF COMPLAINT:   Chief Complaint  Patient presents with  . Loss of Consciousness   The patient is some more awake, he is demented and has no complaints. REVIEW OF SYSTEMS:  Review of Systems  Unable to perform ROS: Dementia    DRUG ALLERGIES:  No Known Allergies VITALS:  Blood pressure 119/64, pulse 60, temperature 98.4 F (36.9 C), temperature source Oral, resp. rate 20, height 6' (1.829 m), weight 180 lb (81.6 kg), SpO2 96 %. PHYSICAL EXAMINATION:  Physical Exam  Constitutional: He is oriented to person, place, and time.  HENT:  Head: Normocephalic.  Mouth/Throat: Oropharynx is clear and moist.  Eyes: Pupils are equal, round, and reactive to light. Conjunctivae and EOM are normal. No scleral icterus.  Neck: Normal range of motion. Neck supple. No JVD present. No tracheal deviation present.  Cardiovascular: Normal rate, regular rhythm and normal heart sounds. Exam reveals no gallop.  No murmur heard. Pulmonary/Chest: Effort normal and breath sounds normal. No respiratory distress. He has no wheezes. He has no rales.  Abdominal: Soft. Bowel sounds are normal. He exhibits no distension. There is no tenderness. There is no rebound.  Musculoskeletal: Normal range of motion. He exhibits no edema or tenderness.  Neurological: He is alert and oriented to person, place, and time. No cranial nerve deficit.  Skin: No rash noted. No erythema.   LABORATORY PANEL:  Male CBC Recent Labs  Lab 01/01/18 0545  WBC 9.4  HGB 11.9*  HCT 34.7*  PLT 206   ------------------------------------------------------------------------------------------------------------------ Chemistries  Recent Labs  Lab 12/31/17 0626 01/01/18 0545  NA 137 140  K 4.1 3.7  CL 101 108  CO2 24 27  GLUCOSE 181* 99  BUN 17 13  CREATININE 1.22 0.87    CALCIUM 9.0 8.0*  AST 32  --   ALT 20  --   ALKPHOS 83  --   BILITOT 0.7  --    RADIOLOGY:  No results found. ASSESSMENT AND PLAN:    Sepsis on admission Unclear etiology. The patient has been treated with vancomycin and Zosyn.  Vancomycin was discontinued due to negative MRSA.  Follow-up blood culture.  Urinalysis is unremarkable, chest x-ray is unremarkable.  Leukocytosis improved.  Syncope, possible due to hypotension. Hold hypertension medication, IV fluids support.    Hypotension.  The patient was given normal saline bolus in ED.  Blood pressure improved to normal range.  Hold hypertension medication.  Lactic acidosis.    Improved. Leukocytosis.    Improved.  Acute metabolic encephalopathy due to sepsis or dementia.  Improved baseline. Aspiration the fall precaution.  GI bleeding.  Hold Eliquis and GI consult.  Hb down from 14.4 to 11.9. Follow hemoglobin.  No active bleeding. History of PE and DVT 1 year ago.  Hold Eliquis.  Generalized weakness.  PT evaluation suggest skilled nursing facility placement. All the records are reviewed and case discussed with Care Management/Social Worker. Management plans discussed with the patient's POA and they are in agreement.  CODE STATUS: DNR  TOTAL TIME TAKING CARE OF THIS PATIENT: 35 minutes.   More than 50% of the time was spent in counseling/coordination of care: YES  POSSIBLE D/C IN 1-2 DAYS, DEPENDING ON CLINICAL CONDITION.   Demetrios Loll M.D on 01/01/2018 at 2:58 PM  Between 7am to 6pm - Pager - [phone]  After 6pm go to [email or web address] - [facility]

## 2018-01-01 NOTE — Evaluation (Signed)
Physical Therapy Evaluation Patient Details Name: Charles Nelson MRN: [id number] DOB: 07-22-1930 Today's Date: 01/01/2018   History of Present Illness  Charles Nelson is an 84yo white male who comes to [facility] con 5/4 after found in bathroom slumped over s/p LOC. PMH: HTN, DVT, PE, memory loss, neuropathy. Patient comes in from group home/ALF.   Clinical Impression  Pt admitted with above diagnosis. Pt currently with functional limitations due to the deficits listed below (see "PT Problem List"). Upon entry, the patient is received semirecumbent in bed, no family/caregiver present. The pt is awake, pleasant, and agreeable to participate. No acute distress noted at this time. Pt is surprisingly conversational for level of disorientation, but generally very engaging at times. Pt is oriented to self only, following simple commands, with flat affect, but no significant impulsivity. The patient is unaware that the bed is soaked in urine. Functional mobility assessment demonstrates moderate to heavy strength impairment in trunk and BLE, the pt now requiring Mod-assist physical assistance for bed mobility, and max to total assist for transfers, whereas the patient performed these at a higher level of independence PTA. Pt will benefit from skilled PT intervention to increase independence and safety with basic mobility in preparation for discharge to the venue listed below.       Follow Up Recommendations SNF;Supervision/Assistance - 24 hour    Equipment Recommendations  None recommended by PT    Recommendations for Other Services       Precautions / Restrictions Precautions Precautions: Fall Restrictions Weight Bearing Restrictions: No      Mobility  Bed Mobility Overal bed mobility: Needs Assistance Bed Mobility: Supine to Sit     Supine to sit: Mod assist     General bed mobility comments: attempts to pull self to EOB with 2 hand held assist, but too weak, needs assistance and additonal  time  Transfers Overall transfer level: Needs assistance Equipment used: Rolling walker (2 wheeled) Transfers: Stand Pivot Transfers;Sit to/from Stand Sit to Stand: From elevated surface;Max assist Stand pivot transfers: +2 physical assistance;Max assist;From elevated surface       General transfer comment: continued retropulsion, unable to correct with verbal cues, legs buckling when attempting SLS for taking steps to chair.   Ambulation/Gait Ambulation/Gait assistance: (not appropriate at this time,. )              Stairs            Wheelchair Mobility    Modified Rankin (Stroke Patients Only)       Balance Overall balance assessment: History of Falls;Needs assistance Sitting-balance support: No upper extremity supported;Feet unsupported Sitting balance-Leahy Scale: Good     Standing balance support: Bilateral upper extremity supported;During functional activity Standing balance-Leahy Scale: Zero                               Pertinent Vitals/Pain Pain Assessment: No/denies pain    Home Living Family/patient expects to be discharged to:: (group home/ALF )                 Additional Comments: Pt O to self only, denies living in group home, but reports to use a RW (pointing) referring to it as a "chair"    Prior Function Level of Independence: Needs assistance         Comments: Per note from prior admission 6MA: at group home (confirmed via telephone call), patient ambulatory with RW at baseline.  Requires "  boosting" assist with rising from seating surfaces, but able to mobilize throughout facility and complete toileting needs without hands-on assist.       Hand Dominance        Extremity/Trunk Assessment   Upper Extremity Assessment Upper Extremity Assessment: Generalized weakness    Lower Extremity Assessment Lower Extremity Assessment: Generalized weakness       Communication      Cognition Arousal/Alertness:  Awake/alert Behavior During Therapy: Flat affect Overall Cognitive Status: History of cognitive impairments - at baseline Area of Impairment: Orientation;Memory;Following commands;Safety/judgement;Awareness;Problem solving                 Orientation Level: Disoriented to;Place;Time;Situation   Memory: Decreased recall of precautions;Decreased short-term memory Following Commands: Follows one step commands consistently   Awareness: Anticipatory;Emergent          General Comments      Exercises     Assessment/Plan    PT Assessment Patient needs continued PT services  PT Problem List Decreased strength;Decreased activity tolerance;Decreased balance;Decreased mobility;Decreased cognition;Decreased safety awareness;Decreased knowledge of precautions       PT Treatment Interventions Functional mobility training;Therapeutic activities;Therapeutic exercise;Patient/family education    PT Goals (Current goals can be found in the Care Plan section)  Acute Rehab PT Goals PT Goal Formulation: Patient unable to participate in goal setting Time For Goal Achievement: 01/15/18    Frequency Min 2X/week   Barriers to discharge        Co-evaluation               AM-PAC PT "6 Clicks" Daily Activity  Outcome Measure Difficulty turning over in bed (including adjusting bedclothes, sheets and blankets)?: A Lot Difficulty moving from lying on back to sitting on the side of the bed? : A Lot Difficulty sitting down on and standing up from a chair with arms (e.g., wheelchair, bedside commode, etc,.)?: Unable Help needed moving to and from a bed to chair (including a wheelchair)?: Total Help needed walking in hospital room?: Total Help needed climbing 3-5 steps with a railing? : Total 6 Click Score: 8    End of Session   Activity Tolerance: Patient tolerated treatment well;Patient limited by fatigue;No increased pain Patient left: in chair;with call bell/phone within  reach;with chair alarm set;with family/visitor present Nurse Communication: Mobility status PT Visit Diagnosis: Unsteadiness on feet (R26.81);Difficulty in walking, not elsewhere classified (R26.2);History of falling (Z91.81)    Time: 1610-9604 PT Time Calculation (min) (ACUTE ONLY): 28 min   Charges:   PT Evaluation $PT Eval Moderate Complexity: 1 Mod PT Treatments $Therapeutic Activity: 8-22 mins   PT G Codes:        2:22 PM, Jan 22, 2018 Etta Grandchild, PT, DPT Physical Therapist - [facility]  [phone] ([place])     Nelson,Charles C Jan 22, 2018, 2:19 PM

## 2018-01-01 NOTE — Clinical Social Work Note (Signed)
The CSW received a call back from the patient's legal guardian who confirmed that she is in agreement with the patient returning to [facility] when he is stable and that she understands that his discharge may occur tomorrow pending stability. CSW will continue to follow.  Santiago Bumpers, MSW, Latanya Presser [phone]

## 2018-01-01 NOTE — Clinical Social Work Note (Signed)
Clinical Social Work Assessment  Patient Details  Name: Charles Nelson MRN: [id number] Date of Birth: 1929-11-10  Date of referral:  01/01/18               Reason for consult:  Facility Placement                Permission sought to share information with:  Facility Art therapist granted to share information::  Yes, Verbal Permission Granted  Name::        Agency::  [facility] (ALF) and April Goggings (legal guardian)  Relationship::     Contact Information:     Housing/Transportation Living arrangements for the past 2 months:  [place] of Information:  Medical Team, Facility Patient Interpreter Needed:  None Criminal Activity/Legal Involvement Pertinent to Current Situation/Hospitalization:  No - Comment as needed Significant Relationships:  Warehouse manager Lives with:  Facility Resident Do you feel safe going back to the place where you live?  Yes Need for family participation in patient care:  Yes (Comment)(Patient has a legal guardian)  Care giving concerns:  CSW viewed in a chart review that the patient is from a group home/ALF   Social Worker assessment / plan:  The CSW attempted to speak with the patient who was lethargic and only oriented X2 (self and place). The CSW attempted to contact the patient's legal guardian and left a HIPPA compliant voicemail. The CSW researched the patient's address and phone number to determine from which facility the patient admitted. The CSW contacted [facility] and spoke with Aniceto Boss who confirmed that the patient is a resident and can return when stable. Currently, the patient is under sepsis protocol and cannot discharge without contact from the patient's legal guardian. The CSW is following to facilitate discharge when the patient is stable.    Employment status:  Retired Forensic scientist:  Medicare PT Recommendations:  Not assessed at this time Information / Referral to  community resources:     Patient/Family's Response to care:  The patient was pleasant but not oriented past self and place. The patient's legal guardian was not able to be reached.  Patient/Family's Understanding of and Emotional Response to Diagnosis, Current Treatment, and Prognosis:  The patient has a legal guardian who was not able to be reached. The patient does not have adequate capacity at this time to understand his diagnosis.  Emotional Assessment Appearance:  Appears older than stated age Attitude/Demeanor/Rapport:  Lethargic Affect (typically observed):  Stable Orientation:  Oriented to Self, Oriented to Place Alcohol / Substance use:  Never Used Psych involvement (Current and /or in the community):  No (Comment)  Discharge Needs  Concerns to be addressed:  Care Coordination, Discharge Planning Concerns Readmission within the last 30 days:  No Current discharge risk:  None Barriers to Discharge:  Continued Medical Work up, Games developer for legal guardian contact)   Zettie Pho, LCSW 01/01/2018, 11:35 AM

## 2018-01-02 MED ORDER — PANTOPRAZOLE SODIUM 40 MG PO TBEC
40.0000 mg | DELAYED_RELEASE_TABLET | Freq: Every day | ORAL | Status: DC
  Administered 2018-01-02 – 2018-01-03 (×2): 40 mg via ORAL
  Filled 2018-01-02 (×2): qty 1

## 2018-01-02 MED ORDER — AMOXICILLIN-POT CLAVULANATE 875-125 MG PO TABS
1.0000 | ORAL_TABLET | Freq: Two times a day (BID) | ORAL | Status: DC
  Administered 2018-01-02 – 2018-01-03 (×3): 1 via ORAL
  Filled 2018-01-02 (×3): qty 1

## 2018-01-02 MED ORDER — PANTOPRAZOLE SODIUM 40 MG PO TBEC: 40.0000 mg | DELAYED_RELEASE_TABLET | Freq: Every day | ORAL | 1 refills | Status: AC

## 2018-01-02 MED ORDER — AMOXICILLIN-POT CLAVULANATE 875-125 MG PO TABS: 1.0000 | ORAL_TABLET | Freq: Two times a day (BID) | ORAL | 0 refills | Status: DC

## 2018-01-02 NOTE — Clinical Social Work Note (Signed)
Patient was unable to get up and ambulate with staff today and was not stable on his feet. CSW spoke with the guardian, April, at length today and she is in agreement with STR and prefers [facility]. CSW did bedsearch and [facility] is able to offer. We will look to discharge patient tomorrow. Shela Leff MSW,LCSW [phone]

## 2018-01-02 NOTE — NC FL2 (Signed)
[place] LEVEL OF CARE SCREENING TOOL     IDENTIFICATION  Patient Name: Charles Nelson Birthdate: 1930/04/18 Sex: male Admission Date (Current Location): 12/31/2017  [place] and [place] Number:  Engineering geologist and Address:  [facility], [address] [place], [place] [address]      Provider Number: [id number]  Attending Physician Name and Address:  Demetrios Loll, MD  Relative Name and Phone Number:  April Goggins (Legal Guardian) [phone]    Current Level of Care: Hospital Recommended Level of Care: [place] Prior Approval Number:    Date Approved/Denied:   PASRR Number: [id number]  Discharge Plan: SNF    Current Diagnoses: Patient Active Problem List   Diagnosis Date Noted  . Syncope 12/31/2017  . Sepsis ([place]) 12/31/2017  . Leg swelling 11/11/2017  . Essential hypertension 11/11/2017  . Pure hypercholesterolemia 11/11/2017  . Confirmed venous thromboembolism (VTE) 06/27/2017  . DVT (deep venous thrombosis) ([place]) 08/31/2016    Orientation RESPIRATION BLADDER Height & Weight     Self, Time, Place  Normal Incontinent Weight: 180 lb (81.6 kg) Height:  6' (182.9 cm)  BEHAVIORAL SYMPTOMS/MOOD NEUROLOGICAL BOWEL NUTRITION STATUS  (None) (none ) Continent Diet(Heart Healthy )  AMBULATORY STATUS COMMUNICATION OF NEEDS Skin   Extensive Assist Verbally Normal                       Personal Care Assistance Level of Assistance  Bathing, Feeding, Dressing Bathing Assistance: Limited assistance Feeding assistance: Limited assistance Dressing Assistance: Limited assistance     Functional Limitations Info  Sight Sight Info: Impaired        SPECIAL CARE FACTORS FREQUENCY  PT (By licensed PT), OT (By licensed OT)                    Contractures Contractures Info: Not present    Additional Factors Info  Code Status, Allergies Code Status Info: DNR Allergies Info: NKA            Current Medications (01/02/2018):  This is the current hospital active medication list Current Facility-Administered Medications  Medication Dose Route Frequency Provider Last Rate Last Dose  . acetaminophen (TYLENOL) tablet 650 mg  650 mg Oral Q6H PRN Demetrios Loll, MD   650 mg at 01/01/18 2259   Or  . acetaminophen (TYLENOL) suppository 650 mg  650 mg Rectal Q6H PRN Demetrios Loll, MD      . albuterol (PROVENTIL) (2.5 MG/3ML) 0.083% nebulizer solution 2.5 mg  2.5 mg Nebulization Q2H PRN Demetrios Loll, MD      . amoxicillin-clavulanate (AUGMENTIN) 875-125 MG per tablet 1 tablet  1 tablet Oral Q12H Demetrios Loll, MD      . bisacodyl (DULCOLAX) EC tablet 5 mg  5 mg Oral Daily PRN Demetrios Loll, MD      . HYDROcodone-acetaminophen (NORCO/VICODIN) 5-325 MG per tablet 1-2 tablet  1-2 tablet Oral Q4H PRN Demetrios Loll, MD   2 tablet at 12/31/17 1457  . ondansetron (ZOFRAN) tablet 4 mg  4 mg Oral Q6H PRN Demetrios Loll, MD       Or  . ondansetron [facility]) injection 4 mg  4 mg Intravenous Q6H PRN Demetrios Loll, MD      . pantoprazole (PROTONIX) EC tablet 40 mg  40 mg Oral Daily Demetrios Loll, MD      . senna-docusate (Senokot-S) tablet 1 tablet  1 tablet Oral QHS PRN Demetrios Loll, MD  Discharge Medications: Please see discharge summary for a list of discharge medications.  Relevant Imaging Results:  Relevant Lab Results:   Additional Information SSN [id number]  Annamaria Boots, [place]

## 2018-01-02 NOTE — Progress Notes (Signed)
   [place] [facility] [place] NAME: Charles Nelson    MR#:  [id number]  DATE OF BIRTH:  [birth year]  SUBJECTIVE:  CHIEF COMPLAINT:   Chief Complaint  Patient presents with  . Loss of Consciousness   The patient is more awake, he is demented and has no complaints. REVIEW OF SYSTEMS:  Review of Systems  Unable to perform ROS: Dementia    DRUG ALLERGIES:  No Known Allergies VITALS:  Blood pressure 136/67, pulse 81, temperature 98.5 F (36.9 C), temperature source Oral, resp. rate 20, height 6' (1.829 m), weight 180 lb (81.6 kg), SpO2 96 %. PHYSICAL EXAMINATION:  Physical Exam  Constitutional: He appears well-developed.  HENT:  Head: Normocephalic.  Mouth/Throat: Oropharynx is clear and moist.  Eyes: Pupils are equal, round, and reactive to light. Conjunctivae and EOM are normal. No scleral icterus.  Neck: Normal range of motion. Neck supple. No JVD present. No tracheal deviation present.  Cardiovascular: Normal rate, regular rhythm and normal heart sounds. Exam reveals no gallop.  No murmur heard. Pulmonary/Chest: Effort normal and breath sounds normal. No respiratory distress. He has no wheezes. He has no rales.  Abdominal: Soft. Bowel sounds are normal. He exhibits no distension. There is no tenderness. There is no rebound.  Musculoskeletal: He exhibits no edema or tenderness.  Neurological: No cranial nerve deficit.  Demented.  Skin: No rash noted. No erythema.   LABORATORY PANEL:  Male CBC Recent Labs  Lab 01/01/18 0545  WBC 9.4  HGB 11.9*  HCT 34.7*  PLT 206   ------------------------------------------------------------------------------------------------------------------ Chemistries  Recent Labs  Lab 12/31/17 0626 01/01/18 0545  NA 137 140  K 4.1 3.7  CL 101 108  CO2 24 27  GLUCOSE 181* 99  BUN 17 13  CREATININE 1.22 0.87  CALCIUM 9.0 8.0*  AST 32  --   ALT 20  --   ALKPHOS 83  --   BILITOT 0.7  --     RADIOLOGY:  No results found. ASSESSMENT AND PLAN:    Sepsis on admission Unclear etiology. The patient was treated with vancomycin and Zosyn.  Vancomycin was discontinued due to negative MRSA.  Follow-up blood culture.  Urinalysis is unremarkable, chest x-ray is unremarkable.  Leukocytosis improved.  Syncope, possible due to hypotension. Hold hypertension medication, IV fluids support.    Hypotension.  The patient was given normal saline bolus in ED.  Blood pressure improved to normal range.  Hold hypertension medication.  Lactic acidosis.    Improved. Leukocytosis.    Improved.  Acute metabolic encephalopathy due to sepsis or dementia.  Improved baseline. Aspiration the fall precaution.  GI bleeding.  Hold Eliquis and GI consult.  Hb down from 14.4 to 11.9. Follow hemoglobin.  No active bleeding. History of PE and DVT 1 year ago.  Hold Eliquis.  Generalized weakness.  PT evaluation suggest skilled nursing facility placement. All the records are reviewed and case discussed with Care Management/Social Worker. Management plans discussed with the patient's POA and they are in agreement.  CODE STATUS: DNR  TOTAL TIME TAKING CARE OF THIS PATIENT: 35 minutes.   More than 50% of the time was spent in counseling/coordination of care: YES  POSSIBLE D/C IN 1-2 DAYS, DEPENDING ON CLINICAL CONDITION.   Demetrios Loll M.D on 01/02/2018 at 5:27 PM  Between 7am to 6pm - Pager - [phone]  After 6pm go to [email or web address] - [facility] Hospital[facility]

## 2018-01-02 NOTE — Care Management (Signed)
CM spoke with April- patient's guardian and if patient would be able to discharge directly back to the family care home, would have no agency preference. It is now anticipated he will discharge to SNF prior to returning to family care home.

## 2018-01-03 NOTE — Progress Notes (Signed)
Charles Nelson  Nelson and Charles Nelson x 1. VSS. Pt tolerating diet well. No complaints of pain or nausea. IV removed intact, prescriptions given. Report called to Charles Nelson at [facility]. Pt discharged via wheelchair with RN. Patient guardian Charles Nelson to transport to facility.   Charles Bologna MSN, RN-BC  Allergies as of 01/03/2018   No Known Allergies     Medication List    STOP taking these medications   ELIQUIS STARTER PACK 5 MG Tabs   furosemide 20 MG tablet Commonly known as:  LASIX   lisinopril 10 MG tablet Commonly known as:  PRINIVIL,ZESTRIL   potassium chloride 10 MEQ tablet Commonly known as:  K-DUR     TAKE these medications   acetaminophen 500 MG tablet Commonly known as:  TYLENOL Take 1,000 mg by mouth 3 (three) times daily as needed for mild pain or moderate pain.   amoxicillin-clavulanate 875-125 MG tablet Commonly known as:  AUGMENTIN Take 1 tablet by mouth every 12 (twelve) hours.   docusate sodium 100 MG capsule Commonly known as:  COLACE Take 100 mg by mouth 2 (two) times daily as needed for mild constipation.   Melatonin 10 MG Tabs Take 10 mg by mouth at bedtime.   pantoprazole 40 MG tablet Commonly known as:  PROTONIX Take 1 tablet (40 mg total) by mouth daily.   polyethylene glycol packet Commonly known as:  MIRALAX / GLYCOLAX Take 17 g by mouth daily as needed for mild constipation.   pravastatin 40 MG tablet Commonly known as:  PRAVACHOL Take 40 mg by mouth at bedtime.   risperiDONE 2 MG tablet Commonly known as:  RISPERDAL Take 2 mg by mouth at bedtime.   THERAVIM-M Tabs Take 1 tablet by mouth daily.   tiZANidine 4 MG capsule Commonly known as:  ZANAFLEX Take 4 mg by mouth 3 (three) times daily.   vitamin B-12 1000 MCG tablet Commonly known as:  CYANOCOBALAMIN Take 2,000 mcg by mouth daily.   vitamin C 500 MG tablet Commonly known as:  ASCORBIC ACID Take 500 mg by mouth 2 (two) times daily.   Vitamin D 2000 units tablet Take 2,000 Units by  mouth daily.   zinc sulfate 220 (50 Zn) MG capsule Take 220 mg by mouth daily.       Vitals:   01/03/18 0424 01/03/18 1316  BP: (!) 149/67 (!) 144/67  Pulse: (!) 41 (!) 57  Resp: 18 19  Temp: 98.5 F (36.9 C) 98.7 F (37.1 C)  SpO2: 98% 97%

## 2018-01-03 NOTE — Care Management Important Message (Signed)
Patient unable to sign.  Spoke with April Goggings, patient's legal guardian [phone]), and reviewed Medicare right with her.  She is aware of discharge right and is in agreement with discharge.

## 2018-01-03 NOTE — Discharge Summary (Signed)
[facility] [place] NAME: Charles Nelson    MR#:  [id number]  DATE OF BIRTH:  1930-08-18  DATE OF ADMISSION:  12/31/2017   ADMITTING PHYSICIAN: Demetrios Loll, MD  DATE OF DISCHARGE: 01/03/2018 PRIMARY CARE PHYSICIAN: Lorelee Market, MD   ADMISSION DIAGNOSIS:  Hypotension, unspecified hypotension type [I95.9] Gastrointestinal hemorrhage, unspecified gastrointestinal hemorrhage type [K92.2] Syncope, unspecified syncope type [R55] Sepsis ([place]) [A41.9] DISCHARGE DIAGNOSIS:  Active Problems:   Syncope   Sepsis ([place])  SECONDARY DIAGNOSIS:   Past Medical History:  Diagnosis Date  . DVT (deep venous thrombosis) ([place])   . Insomnia   . Memory loss   . Neuropathy    HOSPITAL COURSE:  Sepsis on admissionUnclear etiology. The patient was treated with vancomycin and Zosyn.  Vancomycin was discontinued due to negative MRSA.  Negative blood culture so far.  Urinalysis is unremarkable, chest x-ray is unremarkable.  Leukocytosis improved.  Syncope,possible due to hypotension. Hold hypertension medication, IV fluids support.   Hypotension. The patient was given normal saline bolus in ED. Blood pressure improved to normal range. Hold hypertension medication.  Lactic acidosis.   Improved. Leukocytosis.   Improved.  Acute metabolic encephalopathy due tosepsis ordementia.  Improved baseline. Aspiration the fall precaution.  GI bleeding. Hold Eliquis and GI consult. Hb down from 14.4 to 11.9. Follow hemoglobin.  No active bleeding. History ofPE andDVT 1 year ago. Hold Eliquis.  Generalized weakness.  PT evaluation suggest skilled nursing facility placement. DISCHARGE CONDITIONS:  Stable, discharge to SNF today. CONSULTS OBTAINED:  Treatment Team:  Jonathon Bellows, MD Demetrios Loll, MD DRUG ALLERGIES:  No Known Allergies DISCHARGE MEDICATIONS:   Allergies as of 01/03/2018   No Known Allergies     Medication List    STOP  taking these medications   ELIQUIS STARTER PACK 5 MG Tabs   furosemide 20 MG tablet Commonly known as:  LASIX   lisinopril 10 MG tablet Commonly known as:  PRINIVIL,ZESTRIL   potassium chloride 10 MEQ tablet Commonly known as:  K-DUR     TAKE these medications   acetaminophen 500 MG tablet Commonly known as:  TYLENOL Take 1,000 mg by mouth 3 (three) times daily as needed for mild pain or moderate pain.   amoxicillin-clavulanate 875-125 MG tablet Commonly known as:  AUGMENTIN Take 1 tablet by mouth every 12 (twelve) hours.   docusate sodium 100 MG capsule Commonly known as:  COLACE Take 100 mg by mouth 2 (two) times daily as needed for mild constipation.   Melatonin 10 MG Tabs Take 10 mg by mouth at bedtime.   pantoprazole 40 MG tablet Commonly known as:  PROTONIX Take 1 tablet (40 mg total) by mouth daily.   polyethylene glycol packet Commonly known as:  MIRALAX / GLYCOLAX Take 17 g by mouth daily as needed for mild constipation.   pravastatin 40 MG tablet Commonly known as:  PRAVACHOL Take 40 mg by mouth at bedtime.   risperiDONE 2 MG tablet Commonly known as:  RISPERDAL Take 2 mg by mouth at bedtime.   THERAVIM-M Tabs Take 1 tablet by mouth daily.   tiZANidine 4 MG capsule Commonly known as:  ZANAFLEX Take 4 mg by mouth 3 (three) times daily.   vitamin B-12 1000 MCG tablet Commonly known as:  CYANOCOBALAMIN Take 2,000 mcg by mouth daily.   vitamin C 500 MG tablet Commonly known as:  ASCORBIC ACID Take 500 mg by mouth 2 (two) times daily.   Vitamin D 2000  units tablet Take 2,000 Units by mouth daily.   zinc sulfate 220 (50 Zn) MG capsule Take 220 mg by mouth daily.        DISCHARGE INSTRUCTIONS:  See AVS. If you experience worsening of your admission symptoms, develop shortness of breath, life threatening emergency, suicidal or homicidal thoughts you must seek medical attention immediately by calling 911 or calling your MD immediately  if  symptoms less severe.  You Must read complete instructions/literature along with all the possible adverse reactions/side effects for all the Medicines you take and that have been prescribed to you. Take any new Medicines after you have completely understood and accpet all the possible adverse reactions/side effects.   Please note  You were cared for by a hospitalist during your hospital stay. If you have any questions about your discharge medications or the care you received while you were in the hospital after you are discharged, you can call the unit and asked to speak with the hospitalist on call if the hospitalist that took care of you is not available. Once you are discharged, your primary care physician will handle any further medical issues. Please note that NO REFILLS for any discharge medications will be authorized once you are discharged, as it is imperative that you return to your primary care physician (or establish a relationship with a primary care physician if you do not have one) for your aftercare needs so that they can reassess your need for medications and monitor your lab values.    On the day of Discharge:  VITAL SIGNS:  Blood pressure (!) 149/67, pulse (!) 41, temperature 98.5 F (36.9 C), temperature source Oral, resp. rate 18, height 6' (1.829 m), weight 180 lb (81.6 kg), SpO2 98 %. PHYSICAL EXAMINATION:  GENERAL:  84 y.o.-year-old patient lying in the bed with no acute distress.  EYES: Pupils equal, round, reactive to light and accommodation. No scleral icterus. Extraocular muscles intact.  HEENT: Head atraumatic, normocephalic. Oropharynx and nasopharynx clear.  NECK:  Supple, no jugular venous distention. No thyroid enlargement, no tenderness.  LUNGS: Normal breath sounds bilaterally, no wheezing, rales,rhonchi or crepitation. No use of accessory muscles of respiration.  CARDIOVASCULAR: S1, S2 normal. No murmurs, rubs, or gallops.  ABDOMEN: Soft, non-tender,  non-distended. Bowel sounds present. No organomegaly or mass.  EXTREMITIES: No pedal edema, cyanosis, or clubbing.  NEUROLOGIC: Cranial nerves II through XII are intact. Muscle strength 4/5 in all extremities. Sensation intact. Gait not checked.  PSYCHIATRIC: The patient is alert and oriented x 2-3.  SKIN: No obvious rash, lesion, or ulcer.  DATA REVIEW:   CBC Recent Labs  Lab 01/01/18 0545  WBC 9.4  HGB 11.9*  HCT 34.7*  PLT 206    Chemistries  Recent Labs  Lab 12/31/17 0626 01/01/18 0545  NA 137 140  K 4.1 3.7  CL 101 108  CO2 24 27  GLUCOSE 181* 99  BUN 17 13  CREATININE 1.22 0.87  CALCIUM 9.0 8.0*  AST 32  --   ALT 20  --   ALKPHOS 83  --   BILITOT 0.7  --      Microbiology Results  Results for orders placed or performed during the hospital encounter of 12/31/17  Blood culture (routine x 2)     Status: None (Preliminary result)   Collection Time: 12/31/17  8:30 AM  Result Value Ref Range Status   Specimen Description BLOOD RT [facility]  Final   Special Requests   Final  BOTTLES DRAWN AEROBIC AND ANAEROBIC Blood Culture adequate volume   Culture   Final    NO GROWTH 3 DAYS Performed at [facility], [address] [place], [place] [address]    Report Status PENDING  Incomplete  Blood culture (routine x 2)     Status: None (Preliminary result)   Collection Time: 12/31/17  8:30 AM  Result Value Ref Range Status   Specimen Description BLOOD RT [facility]  Final   Special Requests   Final    BOTTLES DRAWN AEROBIC AND ANAEROBIC Blood Culture adequate volume   Culture   Final    NO GROWTH 3 DAYS Performed at [facility], [address] [place], [place] [address]    Report Status PENDING  Incomplete  MRSA PCR Screening     Status: None   Collection Time: 12/31/17 12:50 PM  Result Value Ref Range Status   MRSA by PCR NEGATIVE NEGATIVE Final    Comment:        The GeneXpert MRSA Assay (FDA approved for NASAL specimens only), is one component of  a comprehensive MRSA colonization surveillance program. It is not intended to diagnose MRSA infection nor to guide or monitor treatment for MRSA infections. Performed at [facility], [address], [place], [place] [address]     RADIOLOGY:  No results found.   Management plans discussed with the patient, family and they are in agreement.  CODE STATUS: DNR   TOTAL TIME TAKING CARE OF THIS PATIENT: 33 minutes.    Demetrios Loll M.D on 01/03/2018 at 9:08 AM  Between 7am to 6pm - Pager - [phone]  After 6pm go to [email or web address] - Proofreader  [facility]  Office  [phone]  CC: Primary care physician; Lorelee Market, MD   Note: This dictation was prepared with Dragon dictation along with smaller phrase technology. Any transcriptional errors that result from this process are unintentional.

## 2018-01-03 NOTE — Discharge Instructions (Signed)
Fall and aspiration precaution. °

## 2018-01-03 NOTE — Clinical Social Work Note (Signed)
Patient to discharge today to [facility]. CSW has notified patient's legal guardian: April Goggings and she is aware that patient does not meet criteria to go via EMS. She is going to decide whether she will come to transport or want him to go via EMS. She lives 2 hours away.  Shela Leff MSW,LCSW [phone]

## 2018-01-03 NOTE — Clinical Social Work Note (Signed)
Patient's guardian, April, is going to transport patient to [facility] today. Shela Leff MSW,LCSW [phone]

## 2018-01-03 NOTE — Clinical Social Work Placement (Signed)
   CLINICAL SOCIAL WORK PLACEMENT  NOTE  Date:  01/03/2018  Patient Details  Name: Charles Nelson MRN: [id number] Date of Birth: 1929-10-11  Clinical Social Work is seeking post-discharge placement for this patient at the [place] level of care (*CSW will initial, date and re-position this form in  chart as items are completed):  Yes   Patient/family provided with [facility] Work Department's list of facilities offering this level of care within the geographic area requested by the patient (or if unable, by the patient's family).  Yes   Patient/family informed of their freedom to choose among providers that offer the needed level of care, that participate in Medicare, Medicaid or managed care program needed by the patient, have an available bed and are willing to accept the patient.  Yes   Patient/family informed of [facility]'s ownership interest in [facility] and [facility], as well as of the fact that they are under no obligation to receive care at these facilities.  PASRR submitted to EDS on       PASRR number received on       Existing PASRR number confirmed on 01/03/18     FL2 transmitted to all facilities in geographic area requested by pt/family on 01/03/18     FL2 transmitted to all facilities within larger geographic area on       Patient informed that his/her managed care company has contracts with or will negotiate with certain facilities, including the following:        Yes   Patient/family informed of bed offers received.  Patient chooses bed at [facility])     Physician recommends and patient chooses bed at [facility])    Patient to be transferred to ([facility]) on 01/03/18.  Patient to be transferred to facility by (guardian: April)     Patient family notified on 01/03/18 of transfer.  Name of family member notified:  (Guardian: April)     PHYSICIAN       Additional Comment:     _______________________________________________ Shela Leff, LCSW 01/03/2018, 12:14 PM

## 2018-01-05 LAB — CULTURE, BLOOD (ROUTINE X 2)
Culture: NO GROWTH
Culture: NO GROWTH
Special Requests: ADEQUATE
Special Requests: ADEQUATE

## 2018-03-01 ENCOUNTER — Emergency Department: Payer: Medicare Other

## 2018-03-01 ENCOUNTER — Emergency Department
Admission: EM | Admit: 2018-03-01 | Discharge: 2018-03-02 | Disposition: A | Discharge status: Home / Self Care | Payer: Medicare Other | Attending: Emergency Medicine | Admitting: Emergency Medicine

## 2018-03-01 ENCOUNTER — Encounter: Payer: Self-pay | Admitting: Emergency Medicine

## 2018-03-01 ENCOUNTER — Other Ambulatory Visit: Payer: Self-pay

## 2018-03-01 DIAGNOSIS — Z79899 Other long term (current) drug therapy: Secondary | ICD-10-CM | POA: Insufficient documentation

## 2018-03-01 DIAGNOSIS — I824Y1 Acute embolism and thrombosis of unspecified deep veins of right proximal lower extremity: Secondary | ICD-10-CM

## 2018-03-01 DIAGNOSIS — R600 Localized edema: Secondary | ICD-10-CM | POA: Insufficient documentation

## 2018-03-01 DIAGNOSIS — R2243 Localized swelling, mass and lump, lower limb, bilateral: Secondary | ICD-10-CM | POA: Diagnosis present

## 2018-03-01 DIAGNOSIS — R609 Edema, unspecified: Secondary | ICD-10-CM

## 2018-03-01 DIAGNOSIS — I10 Essential (primary) hypertension: Secondary | ICD-10-CM | POA: Insufficient documentation

## 2018-03-01 LAB — COMPREHENSIVE METABOLIC PANEL
ALT: 19 U/L (ref 0–44)
AST: 22 U/L (ref 15–41)
Albumin: 3.2 g/dL — ABNORMAL LOW (ref 3.5–5.0)
Alkaline Phosphatase: 63 U/L (ref 38–126)
Anion gap: 4 — ABNORMAL LOW (ref 5–15)
BUN: 17 mg/dL (ref 8–23)
CHLORIDE: 109 mmol/L (ref 98–111)
CO2: 29 mmol/L (ref 22–32)
CREATININE: 0.75 mg/dL (ref 0.61–1.24)
Calcium: 8.5 mg/dL — ABNORMAL LOW (ref 8.9–10.3)
GFR calc Af Amer: >60 mL/min (ref >60)
GFR calc non Af Amer: >60 mL/min (ref >60)
GLUCOSE: 126 mg/dL — AB (ref 70–99)
Potassium: 3.8 mmol/L (ref 3.5–5.1)
SODIUM: 142 mmol/L (ref 135–145)
Total Bilirubin: 0.6 mg/dL (ref 0.3–1.2)
Total Protein: 5.9 g/dL — ABNORMAL LOW (ref 6.5–8.1)

## 2018-03-01 LAB — CBC
HCT: 41.5 % (ref 40.0–52.0)
Hemoglobin: 13.7 g/dL (ref 13.0–18.0)
MCH: 31.1 pg (ref 26.0–34.0)
MCHC: 33.1 g/dL (ref 32.0–36.0)
MCV: 94 fL (ref 80.0–100.0)
PLATELETS: 217 10*3/uL (ref 150–440)
RBC: 4.42 MIL/uL (ref 4.40–5.90)
RDW: 14 % (ref 11.5–14.5)
WBC: 9.4 10*3/uL (ref 3.8–10.6)

## 2018-03-01 LAB — BRAIN NATRIURETIC PEPTIDE: B NATRIURETIC PEPTIDE 5: 160 pg/mL — AB (ref 0.0–100.0)

## 2018-03-01 LAB — TROPONIN I: Troponin I: 0.03 ng/mL (ref <0.03)

## 2018-03-01 NOTE — Discharge Instructions (Addendum)
You have been seen in the emergency department for lower extremity swelling.  Your work-up overall is reassuring.  You continue to have a small amount of clot in the left lower extremity although much improved from your prior examination.  I would recommend that you start a low-dose aspirin each day as well as a low-dose diuretic for the next 5 days to help with the swelling.  Please follow-up with your doctor within the next 5 days for recheck/reevaluation.  Return to the emergency department for any chest pain, trouble breathing, or any other symptom personally concerning to yourself.

## 2018-03-01 NOTE — ED Provider Notes (Addendum)
[facility] Emergency Department Provider Note  Time seen: 8:19 PM  I have reviewed the triage vital signs and the nursing notes.   HISTORY  Chief Complaint Leg Swelling    HPI Charles Nelson is a 84 y.o. male with a past medical history of DVT, was on blood thinner but this was discontinued 1 to 2 months ago.  Hypertension, intermittent peripheral edema, presents to the emergency department for 1 week of increasing lower extremity edema.  According to the patient and family for the past 1 week he has had increased swelling to his bilateral feet.  Went to PCP today and was referred to the emergency department for further evaluation.  She denies any recent chest pain, denies any shortness of breath.  Denies any pain in his legs.  He states he feels well besides increased swelling for the past 1 week.  Patient does have a history of DVTs was on blood thinner but this was discontinued approximately 2 months ago.  Family is not sure why this was discontinued.   Past Medical History:  Diagnosis Date  . DVT (deep venous thrombosis) ([place])   . Insomnia   . Memory loss   . Neuropathy     Patient Active Problem List   Diagnosis Date Noted  . Syncope 12/31/2017  . Sepsis ([place]) 12/31/2017  . Leg swelling 11/11/2017  . Essential hypertension 11/11/2017  . Pure hypercholesterolemia 11/11/2017  . Confirmed venous thromboembolism (VTE) 06/27/2017  . DVT (deep venous thrombosis) ([place]) 08/31/2016    Past Surgical History:  Procedure Laterality Date  . pt does not remember surgery hx      Prior to Admission medications   Medication Sig Start Date End Date Taking? Authorizing Provider  acetaminophen (TYLENOL) 500 MG tablet Take 1,000 mg by mouth 3 (three) times daily as needed for mild pain or moderate pain.     [provider]  amoxicillin-clavulanate (AUGMENTIN) 875-125 MG tablet Take 1 tablet by mouth every 12 (twelve) hours. 01/02/18   Demetrios Loll, MD   Cholecalciferol (VITAMIN D) 2000 units tablet Take 2,000 Units by mouth daily.    [provider]  docusate sodium (COLACE) 100 MG capsule Take 100 mg by mouth 2 (two) times daily as needed for mild constipation.    [provider]  Melatonin 10 MG TABS Take 10 mg by mouth at bedtime.    [provider]  Multiple Vitamins-Minerals (THERAVIM-M) TABS Take 1 tablet by mouth daily.    [provider]  pantoprazole (PROTONIX) 40 MG tablet Take 1 tablet (40 mg total) by mouth daily. 01/02/18   Demetrios Loll, MD  polyethylene glycol [facility] Floria Raveling) packet Take 17 g by mouth daily as needed for mild constipation. 06/30/17   Vaughan Basta, MD  pravastatin (PRAVACHOL) 40 MG tablet Take 40 mg by mouth at bedtime.     [provider]  risperiDONE (RISPERDAL) 2 MG tablet Take 2 mg by mouth at bedtime.    [provider]  tiZANidine (ZANAFLEX) 4 MG capsule Take 4 mg by mouth 3 (three) times daily.    [provider]  vitamin B-12 (CYANOCOBALAMIN) 1000 MCG tablet Take 2,000 mcg by mouth daily.    [provider]  vitamin C (ASCORBIC ACID) 500 MG tablet Take 500 mg by mouth 2 (two) times daily.    [provider]  zinc sulfate 220 (50 Zn) MG capsule Take 220 mg by mouth daily.    [provider]  No Known Allergies  Family History  Problem Relation Age of Onset  . Diabetes Neg Hx   . Hypertension Neg Hx   . Deep vein thrombosis Neg Hx     Social History Social History   Tobacco Use  . Smoking status: Never Smoker  . Smokeless tobacco: Never Used  Substance Use Topics  . Alcohol use: No  . Drug use: No    Review of Systems Constitutional: Negative for fever. Cardiovascular: Negative for chest pain. Respiratory: Negative for shortness of breath. Gastrointestinal: Negative for abdominal pain Genitourinary: Negative for urinary compaints Musculoskeletal: Increased lower extremity swelling x1  week. Skin: Negative for skin complaints  Neurological: Negative for headache All other ROS negative  ____________________________________________   PHYSICAL EXAM:  VITAL SIGNS: ED Triage Vitals  Enc Vitals Group     BP 03/01/18 1658 (!) 144/75     Pulse Rate 03/01/18 1658 80     Resp 03/01/18 1918 16     Temp 03/01/18 1658 99.3 F (37.4 C)     Temp Source 03/01/18 1658 Oral     SpO2 03/01/18 1658 97 %     Weight 03/01/18 1701 168 lb (76.2 kg)     Height 03/01/18 1701 6' (1.829 m)     Head Circumference --      [facility] --      Pain Score 03/01/18 1700 0     Pain Loc --      Pain Edu? --      Excl. in [place]? --    Constitutional: Alert. Well appearing and in no distress. Eyes: Normal exam ENT   Head: Normocephalic and atraumatic   Mouth/Throat: Mucous membranes are moist. Cardiovascular: Normal rate, regular rhythm. No murmur Respiratory: Normal respiratory effort without tachypnea nor retractions. Breath sounds are clear Gastrointestinal: Soft and nontender. No distention. Musculoskeletal: 3+ pedal edema b/l, pitting Neurologic:  Normal speech and language. No gross focal neurologic deficits  Skin:  Skin is warm, dry and intact.  Psychiatric: Mood and affect are normal.   ____________________________________________    EKG  EKG reviewed and interpreted by myself shows sinus rhythm at 62 bpm with a narrow QRS, normal axis, largely normal intervals with nonspecific ST changes.  ____________________________________________    RADIOLOGY  Ultrasound shows no evidence of DVT in the right lower examinee.  Patient does have a nonocclusive thrombus in the left lower extremity in the mid and distal left femoral vein.  There are collateral veins which suggest this could be chronic.  Significant improvement in clot burden compared to prior evaluation.  Chest x-ray shows cardiomegaly with vascular congestion. ____________________________________________   INITIAL  IMPRESSION / ASSESSMENT AND PLAN / ED COURSE  Pertinent labs & imaging results that were available during my care of the patient were reviewed by me and considered in my medical decision making (see chart for details).  Patient presents to the emergency department for increased lower extremity edema x1 week.  Differential would include DVT, peripheral edema.  No signs of cellulitis.  I reviewed the patient's records in May of this year he had a GI bleed in which his hemoglobin fell from 14.4-11.9, at that time Eliquis was discontinued, believe this is appropriate.  We will repeat an ultrasound in the emergency department to evaluate for clot burden.  We will check labs including cardiac panel and a chest x-ray as a precaution.  Overall the patient appears well, no distress.  Patient's labs have resulted largely at baseline.  Reassuringly troponin  is 0.03.  Normal kidney function.  Patient's ultrasound does show a nonocclusive DVT in the left lower extremity, although clot burden is much improved from prior evaluation.  I reviewed the patient's chart he was taken off of his Eliquis due to a GI bleed.  Given the ultrasound findings I believe it is still wise to have the patient on aspirin therapy given the likelihood of a chronic DVT in the leg.  Otherwise patient appears well.  Mild vascular congestion on chest x-ray.  I believe the patient would benefit from a diuretic for the next several days and recheck with his doctor.  Patient agreeable to this plan of care.  Family is no longer at the bedside.  Attempted to call patient's family listed in demographics unable to reach them.  ____________________________________________   FINAL CLINICAL IMPRESSION(S) / ED DIAGNOSES  Peripheral edema DVT   Harvest Dark, MD 03/01/18 2311    Harvest Dark, MD 03/01/18 2314

## 2018-03-01 NOTE — ED Notes (Signed)
Pt Legal Guardian April Goggin POA at bedside.

## 2018-03-01 NOTE — ED Triage Notes (Signed)
Pt with increased BLE edema x 1 week. Pt also has decreased circulation several toes bilateral feet. When to pcp today and was sent here for evaluation.

## 2018-03-02 DIAGNOSIS — I824Y1 Acute embolism and thrombosis of unspecified deep veins of right proximal lower extremity: Secondary | ICD-10-CM | POA: Diagnosis not present

## 2018-03-02 LAB — TROPONIN I: Troponin I: <0.03 ng/mL (ref <0.03)

## 2018-03-02 MED ORDER — DOCUSATE SODIUM 100 MG PO CAPS
100.0000 mg | ORAL_CAPSULE | Freq: Two times a day (BID) | ORAL | Status: DC | PRN
  Filled 2018-03-02: qty 1

## 2018-03-02 MED ORDER — FUROSEMIDE 10 MG/ML IJ SOLN
20.0000 mg | Freq: Once | INTRAMUSCULAR | Status: AC
  Administered 2018-03-02: 20 mg via INTRAVENOUS
  Filled 2018-03-02: qty 4

## 2018-03-02 MED ORDER — RISPERIDONE 1 MG PO TABS: 2.0000 mg | ORAL_TABLET | Freq: Every day | ORAL | Status: DC

## 2018-03-02 MED ORDER — FUROSEMIDE 40 MG PO TABS: 20.0000 mg | ORAL_TABLET | Freq: Every day | ORAL | 0 refills | Status: DC

## 2018-03-02 MED ORDER — PRAVASTATIN SODIUM 40 MG PO TABS
40.0000 mg | ORAL_TABLET | Freq: Every day | ORAL | Status: DC
  Filled 2018-03-02: qty 1

## 2018-03-02 MED ORDER — FUROSEMIDE 40 MG PO TABS: 40.0000 mg | ORAL_TABLET | Freq: Every day | ORAL | Status: DC

## 2018-03-02 MED ORDER — FUROSEMIDE 40 MG PO TABS: 20.0000 mg | ORAL_TABLET | Freq: Every day | ORAL | 0 refills | Status: AC

## 2018-03-02 MED ORDER — ACETAMINOPHEN 500 MG PO TABS
1000.0000 mg | ORAL_TABLET | Freq: Three times a day (TID) | ORAL | Status: DC | PRN
Start: 2018-03-02 — End: 2018-03-02
  Filled 2018-03-02: qty 2

## 2018-03-02 NOTE — ED Notes (Signed)
Verbal report given to Lattie Haw, Med Tech at facility.

## 2018-03-02 NOTE — ED Notes (Signed)
Upon arrival to room, pt IV was laying on floor and pt was bleeding. Pt had no recollection of how IV came out. Bleeding under control at this time.

## 2018-03-02 NOTE — ED Notes (Signed)
Pt sleeping at this time, NAD noted. RR even and unlabored. Will continue to monitor

## 2018-03-02 NOTE — Care Management (Addendum)
Spoke with April, legal guardian and daughter. Discussed home health agencies. States that she thinks her father has had [facility] before. Would like them again Floydene Flock, [facility] representative updated. Received call back from [facility] representative. Unable to take case due to not being in network. Will fax to [facility] Will be returning to [facility] today Shelbie Ammons RN MSN [facility] [phone]

## 2018-03-02 NOTE — ED Notes (Addendum)
Legal guardian April, daughter  [phone]  Called to request MD call her . Will make MD aware

## 2018-03-02 NOTE — ED Notes (Signed)
Sandwich tray given 

## 2018-03-02 NOTE — ED Notes (Signed)
Social worker at bedside.

## 2018-03-02 NOTE — ED Notes (Signed)
Ems on scene to take pt back to [facility]. Paperwork given.

## 2018-03-02 NOTE — ED Provider Notes (Signed)
Patient in presentation with the legal guardian, April.  Guardian is reassured and feels comfortable patient going back to assisted living facility with home health.  Will be given referral to vascular surgery as well as podiatry.  Will be started on a short course of diuretic with outpatient follow-up to ensure that it is improving not having worsening renal function.  Patient remains [place] dynamically stable.  We did discuss the risk benefits of anticoagulation and I informed patient patient's guardian that in the setting of improving ultrasound with evidence of probable chronic DVT I do feel that the risks of anticoagulation particularly given the patient's recent GI bleed on Eliquis outweigh the benefits in this presentation.  Have discussed with the patient and available family all diagnostics and treatments performed thus far and all questions were answered to the best of my ability. The patient demonstrates understanding and agreement with plan.    Merlyn Lot, MD 03/02/18 1534

## 2018-03-02 NOTE — ED Provider Notes (Signed)
Patient rounded on.  Seems that presentation is more of a social replacement issue.  Does have some lower extremity edema but not meeting any criteria for inpatient admission.  Does appear the patient can be arranged for home health arranged back at assisted living.   Charles Lot, MD 03/02/18 [phone]

## 2018-03-02 NOTE — ED Notes (Signed)
Conducted PTA medication interview with patient's guardian/daughter via phone. She reports patient is currently taking vitamins and supplements but most other medications have been discontinued. She repeatedly said she thinks patient should resume ELIQUIS because she says there was an improvement in leg swelling. Unsure if she meant FUROSEMIDE which was discontinued by MD Bridgett Larsson in May. She also reports TRAZODONE helps calm patient's mind at night but does not seem to be taking at this time.   ** The above is intended solely for informational and/or communicative purposes. It should in no way be considered an endorsement of any specific treatment, therapy or action. **

## 2018-03-02 NOTE — Clinical Social Work Note (Signed)
Clinical Social Work Assessment  Patient Details  Name: Charles Nelson MRN: [id number] Date of Birth: 1930/05/30  Date of referral:  03/02/18               Reason for consult:  Facility Placement                Permission sought to share information with:  Case Manager, Customer service manager, Family Supports Permission granted to share information::  Yes, Verbal Permission Granted  Name::        Agency::     Relationship::     Contact Information:     Housing/Transportation Living arrangements for the past 2 months:  [place] of Information:  Guardian Patient Interpreter Needed:  None Criminal Activity/Legal Involvement Pertinent to Current Situation/Hospitalization:  No - Comment as needed Significant Relationships:  None Lives with:  Facility Resident Do you feel safe going back to the place where you live?  Yes Need for family participation in patient care:  Yes (Comment)  Care giving concerns:  Patient lives in [facility]    Social Worker assessment / plan:  CSW consulted for SNF placement. CSW met with patient. He is confused and has Dementia. Patient states that he lives alone and has no family support. CSW found through chart review that patient has a guardian. CSW contacted patient's guardian April Goggins [phone]. Guardian states that patient lives at [facility] assisted living. Guardian states that patient is unable to walk and therefore can not return to Assisted Living. CSW explained that there is nothing medically we can admit patient to the hospital for and since his insurance is Medicare, it will not pay for a facility or higher level of care without an inpatient stay. Guardian states that patient is unable to walk and we would need to have a plan in place before discharging. CSW explained that patient could go to a SNF if they pay privately since insurance will not pay. Guardian states that they can not pay  privately. Guardian states that patient is unable to walk and therefore can not return to assisted living. Guardian states that she would like to speak with the doctor regarding care. CSW encouraged guardian to contact the physician and have medical questions answered. CSW explained that patient could return to ALF with home health. Guardian states that she will talk with facility and physician before taking patient back. CSW notified patient's RN of above. CSW also contacted Elspeth Cho of [facility] [phone] regarding patient. He states that patient does not have Medicaid and the only insurance he is aware of is the Medicare. He also states that patient could receive home health services at facility. Vincente Liberty states that he will speak with guardian regarding patient care. CSW notified MD of above and requested a PT eval since patient is unable to walk. CSW also discussed case with supervisor and SW management.   Employment status:  Retired Forensic scientist:  Medicare PT Recommendations:  Not assessed at this time Information / Referral to community resources:     Patient/Family's Response to care:  Patient thanked CSW for helping him   Patient/Family's Understanding of and Emotional Response to Diagnosis, Current Treatment, and Prognosis:  Patient has a guardian. She is aware of current situation and would like to speak with physician.   Emotional Assessment Appearance:  Appears stated age Attitude/Demeanor/Rapport:    Affect (typically observed):  Pleasant, Quiet Orientation:  Oriented to Self Alcohol / Substance use:  Not  Applicable Psych involvement (Current and /or in the community):  No (Comment)  Discharge Needs  Concerns to be addressed:  Discharge Planning Concerns Readmission within the last 30 days:  No Current discharge risk:  None Barriers to Discharge:  No Barriers Identified   Annamaria Boots, [place] 03/02/2018, 10:32 AM

## 2018-03-02 NOTE — ED Notes (Addendum)
Due to family unable to care for pt because of leg swelling and unable to stand, pt family asked MD to keep until morning for potential social work consult for rehab care. Pt family verbalized understanding.

## 2018-04-25 ENCOUNTER — Emergency Department
Admission: EM | Admit: 2018-04-25 | Discharge: 2018-04-25 | Disposition: A | Discharge status: Home / Self Care | Payer: Medicare Other | Attending: Emergency Medicine | Admitting: Emergency Medicine

## 2018-04-25 ENCOUNTER — Other Ambulatory Visit: Payer: Self-pay

## 2018-04-25 ENCOUNTER — Emergency Department: Payer: Medicare Other

## 2018-04-25 DIAGNOSIS — I10 Essential (primary) hypertension: Secondary | ICD-10-CM | POA: Insufficient documentation

## 2018-04-25 DIAGNOSIS — R600 Localized edema: Secondary | ICD-10-CM | POA: Insufficient documentation

## 2018-04-25 DIAGNOSIS — Z7901 Long term (current) use of anticoagulants: Secondary | ICD-10-CM | POA: Diagnosis not present

## 2018-04-25 DIAGNOSIS — R531 Weakness: Secondary | ICD-10-CM | POA: Diagnosis present

## 2018-04-25 DIAGNOSIS — Z79899 Other long term (current) drug therapy: Secondary | ICD-10-CM | POA: Diagnosis not present

## 2018-04-25 DIAGNOSIS — R609 Edema, unspecified: Secondary | ICD-10-CM

## 2018-04-25 LAB — CBC WITH DIFFERENTIAL/PLATELET
BASOS ABS: 0 10*3/uL (ref 0–0.1)
BASOS PCT: 0 %
Eosinophils Absolute: 0 10*3/uL (ref 0–0.7)
Eosinophils Relative: 0 %
HEMATOCRIT: 39.6 % — AB (ref 40.0–52.0)
Hemoglobin: 13.3 g/dL (ref 13.0–18.0)
Lymphocytes Relative: 11 %
Lymphs Abs: 1.6 10*3/uL (ref 1.0–3.6)
MCH: 31.6 pg (ref 26.0–34.0)
MCHC: 33.6 g/dL (ref 32.0–36.0)
MCV: 94.1 fL (ref 80.0–100.0)
Monocytes Absolute: 1.6 10*3/uL — ABNORMAL HIGH (ref 0.2–1.0)
Monocytes Relative: 11 %
NEUTROS ABS: 11.3 10*3/uL — AB (ref 1.4–6.5)
NEUTROS PCT: 78 %
Platelets: 222 10*3/uL (ref 150–440)
RBC: 4.2 MIL/uL — ABNORMAL LOW (ref 4.40–5.90)
RDW: 14.2 % (ref 11.5–14.5)
WBC: 14.6 10*3/uL — ABNORMAL HIGH (ref 3.8–10.6)

## 2018-04-25 LAB — BASIC METABOLIC PANEL
ANION GAP: 7 (ref 5–15)
BUN: 22 mg/dL (ref 8–23)
CO2: 28 mmol/L (ref 22–32)
Calcium: 8.6 mg/dL — ABNORMAL LOW (ref 8.9–10.3)
Chloride: 107 mmol/L (ref 98–111)
Creatinine, Ser: 0.89 mg/dL (ref 0.61–1.24)
GFR calc Af Amer: >60 mL/min (ref >60)
GLUCOSE: 125 mg/dL — AB (ref 70–99)
POTASSIUM: 3.8 mmol/L (ref 3.5–5.1)
Sodium: 142 mmol/L (ref 135–145)

## 2018-04-25 LAB — BRAIN NATRIURETIC PEPTIDE: B Natriuretic Peptide: 68 pg/mL (ref 0.0–100.0)

## 2018-04-25 LAB — TROPONIN I: Troponin I: 0.03 ng/mL (ref <0.03)

## 2018-04-25 MED ORDER — FUROSEMIDE 10 MG/ML IJ SOLN
20.0000 mg | Freq: Once | INTRAMUSCULAR | Status: AC
  Administered 2018-04-25: 20 mg via INTRAVENOUS
  Filled 2018-04-25: qty 4

## 2018-04-25 NOTE — ED Triage Notes (Addendum)
Pt arrived via EMS from [facility] c/o side pain that began this morning, unable to walk with walker, bilateral leg swelling, and generalized weakness. Pt has no complaints at this time. A X O x2. EMS unsure of patient's baseline.

## 2018-04-25 NOTE — ED Notes (Signed)
Called for EMS and spoke with Vonna Kotyk.

## 2018-04-25 NOTE — ED Notes (Signed)
Pt self removed left hand IV. Swelling and ecchymosis present.

## 2018-04-25 NOTE — ED Notes (Signed)
Date and time results received: 04/25/18 1718 (use smartphrase ".now" to insert current time)  Test: Troponin Critical Value: 0.03  Name of Provider Notified: Joni Fears  Orders Received? Or Actions Taken?: No orders received

## 2018-04-25 NOTE — ED Notes (Signed)
Legal guardian daughter

## 2018-04-25 NOTE — Discharge Instructions (Addendum)
Your lab tests and chest xray today were okay.  We gave you an extra dose of lasix in the ER.   Follow-up with your cardiologist and the heart failure clinic for continued monitoring of your leg swelling.  For the next 3 days, increase your Lasix to 20 mg (half tablet) 2 times a day.  Measure and record your weight every day to monitor changes in your fluid balance.

## 2018-04-25 NOTE — ED Provider Notes (Signed)
[facility] Emergency Department Provider Note  ____________________________________________  Time seen: Approximately 6:14 PM  I have reviewed the triage vital signs and the nursing notes.   HISTORY  Chief Complaint Weakness  Level 5 Caveat: Portions of the History and Physical including HPI and review of systems are unable to be completely obtained due to patient being a poor historian    HPI Charles Nelson is a 84 y.o. male with a history of DVT, hypertension, peripheral edema who was sent to the ED today from his family care home due to bilateral leg swelling and generalized weakness.  Difficulty walking with his walker.  Patient denies any acute complaints and denies any pain at this time.  Denies any chest pain or shortness of breath.  Reviewed the [facility] which does show compliance with his entire medication regimen.   Patient does report that his legs are more swollen and more painful than usual, been this way for about a week, gradual onset, constant, waxing and waning without aggravating or alleviating factors, nonradiating pain.   Past Medical History:  Diagnosis Date  . DVT (deep venous thrombosis) ([place])   . Insomnia   . Memory loss   . Neuropathy      Patient Active Problem List   Diagnosis Date Noted  . Syncope 12/31/2017  . Sepsis ([place]) 12/31/2017  . Leg swelling 11/11/2017  . Essential hypertension 11/11/2017  . Pure hypercholesterolemia 11/11/2017  . Confirmed venous thromboembolism (VTE) 06/27/2017  . DVT (deep venous thrombosis) ([place]) 08/31/2016     Past Surgical History:  Procedure Laterality Date  . pt does not remember surgery hx       Prior to Admission medications   Medication Sig Start Date End Date Taking? Authorizing Provider  acetaminophen (TYLENOL) 500 MG tablet Take 500 mg by mouth every morning.    Yes [provider]  acetaminophen (TYLENOL) 500 MG tablet Take 1,000 mg by mouth 3 (three) times daily as  needed for mild pain or moderate pain.   Yes [provider]  apixaban (ELIQUIS) 5 MG TABS tablet Take 5 mg by mouth daily.   Yes [provider]  cholecalciferol (VITAMIN D) 1000 units tablet Take 2,000 Units by mouth daily.    Yes [provider]  docusate sodium (COLACE) 100 MG capsule Take 100 mg by mouth 2 (two) times daily as needed for mild constipation.   Yes [provider]  furosemide (LASIX) 40 MG tablet Take 0.5 tablets (20 mg total) by mouth daily. 03/02/18 03/02/19 Yes Merlyn Lot, MD  Melatonin 10 MG TABS Take 10 mg by mouth at bedtime.   Yes [provider]  Multiple Vitamins-Minerals (THERAVIM-M) TABS Take 1 tablet by mouth daily.   Yes [provider]  pantoprazole (PROTONIX) 40 MG tablet Take 1 tablet (40 mg total) by mouth daily. 01/02/18  Yes Demetrios Loll, MD  polyethylene glycol [facility] / Floria Raveling) packet Take 17 g by mouth daily as needed for mild constipation. Patient taking differently: Take 17 g by mouth daily.  06/30/17  Yes Vaughan Basta, MD  pravastatin (PRAVACHOL) 40 MG tablet Take 40 mg by mouth at bedtime.    Yes [provider]  risperiDONE (RISPERDAL) 2 MG tablet Take 2 mg by mouth at bedtime.   Yes [provider]  tiZANidine (ZANAFLEX) 4 MG capsule Take 4 mg by mouth 3 (three) times daily.   Yes [provider]  vitamin B-12 (CYANOCOBALAMIN) 1000 MCG tablet Take 2,000 mcg by mouth  daily.   Yes [provider]  vitamin C (ASCORBIC ACID) 500 MG tablet Take 500 mg by mouth 2 (two) times daily.   Yes [provider]  zinc sulfate 220 (50 Zn) MG capsule Take 220 mg by mouth daily.   Yes [provider]  amoxicillin-clavulanate (AUGMENTIN) 875-125 MG tablet Take 1 tablet by mouth every 12 (twelve) hours. Patient not taking: Reported on 03/02/2018 01/02/18   Demetrios Loll, MD     Allergies Patient has no known allergies.   Family History  Problem Relation  Age of Onset  . Diabetes Neg Hx   . Hypertension Neg Hx   . Deep vein thrombosis Neg Hx     Social History Social History   Tobacco Use  . Smoking status: Never Smoker  . Smokeless tobacco: Never Used  Substance Use Topics  . Alcohol use: No  . Drug use: No    Review of Systems  Constitutional:   No fever or chills.  ENT:   No sore throat. No rhinorrhea. Cardiovascular:   No chest pain or syncope. Respiratory:   No dyspnea or cough. Gastrointestinal:   Negative for abdominal pain, vomiting and diarrhea.  Musculoskeletal:   Positive leg swelling.   All other systems reviewed and are negative except as documented above in ROS and HPI.  ____________________________________________   PHYSICAL EXAM:  VITAL SIGNS: ED Triage Vitals  Enc Vitals Group     BP 04/25/18 1503 135/80     Pulse Rate 04/25/18 1503 83     Resp 04/25/18 1503 18     Temp 04/25/18 1503 99 F (37.2 C)     Temp Source 04/25/18 1503 Oral     SpO2 04/25/18 1503 97 %     Weight --      Height 04/25/18 1504 6' (1.829 m)     Head Circumference --      Peak Flow --      Pain Score 04/25/18 1504 0     Pain Loc --      Pain Edu? --      Excl. in [place]? --     Vital signs reviewed, nursing assessments reviewed.   Constitutional:   Alert and oriented to person and place. Non-toxic appearance. Eyes:   Conjunctivae are normal. EOMI. PERRL. ENT      Head:   Normocephalic and atraumatic.      Nose:   No congestion/rhinnorhea.       Mouth/Throat:   MMM, no pharyngeal erythema. No peritonsillar mass.       Neck:   No meningismus. Full ROM.  JVP normal Hematological/Lymphatic/Immunilogical:   No cervical lymphadenopathy. Cardiovascular:   RRR. Symmetric bilateral radial and DP pulses.  No murmurs. Cap refill less than 2 seconds. Respiratory:   Normal respiratory effort without tachypnea/retractions. Breath sounds are clear and equal bilaterally. No wheezes/rales/rhonchi. Gastrointestinal:   Soft and nontender.  Non distended. There is no CVA tenderness.  No rebound, rigidity, or guarding. Musculoskeletal:   Normal range of motion in all extremities. No joint effusions.  No lower extremity tenderness.  3+ doughy pitting edema bilateral lower extremities, symmetric.  No focal tenderness, negative Homans sign.  No palpable cords. Neurologic:   Normal speech and language.  Motor grossly intact. No acute focal neurologic deficits are appreciated.  Skin:    Skin is warm, dry and intact. No rash noted.  No petechiae, purpura, or bullae.  ____________________________________________    LABS (pertinent positives/negatives) (all labs ordered are listed,  but only abnormal results are displayed) Labs Reviewed  BASIC METABOLIC PANEL - Abnormal; Notable for the following components:      Result Value   Glucose, Bld 125 (*)    Calcium 8.6 (*)    All other components within normal limits  CBC WITH DIFFERENTIAL/PLATELET - Abnormal; Notable for the following components:   WBC 14.6 (*)    RBC 4.20 (*)    HCT 39.6 (*)    Neutro Abs 11.3 (*)    Monocytes Absolute 1.6 (*)    All other components within normal limits  TROPONIN I - Abnormal; Notable for the following components:   Troponin I 0.03 (*)    All other components within normal limits  BRAIN NATRIURETIC PEPTIDE  CBC WITH DIFFERENTIAL/PLATELET   ____________________________________________   EKG  Interpreted by me Sinus rhythm rate of 78, normal axis and intervals.  LVH, right bundle branch block.  No acute ischemic changes.  ____________________________________________    IOXBDZHGD  Dg Chest Portable 1 View  Result Date: 04/25/2018 CLINICAL DATA:  Peripheral edema EXAM: PORTABLE CHEST 1 VIEW COMPARISON:  Chest radiograph 03/01/2018 FINDINGS: Mild cardiomegaly with bibasilar atelectasis. No focal airspace consolidation or pulmonary edema. No pneumothorax or sizable pleural effusion. There is a right shoulder hemiarthroplasty. IMPRESSION:  Cardiomegaly and bibasilar atelectasis without overt pulmonary edema. Electronically Signed   By: Ulyses Jarred M.D.   On: 04/25/2018 16:27    ____________________________________________   PROCEDURES Procedures  ____________________________________________  DIFFERENTIAL DIAGNOSIS   Chronic peripheral edema, decompensated heart failure, non-STEMI, renal failure  CLINICAL IMPRESSION / ASSESSMENT AND PLAN / ED COURSE  Pertinent labs & imaging results that were available during my care of the patient were reviewed by me and considered in my medical decision making (see chart for details).      Clinical Course as of Apr 25 1817  Tue Apr 25, 2018  1553 P/w increase in chronic peripheral edema. On eliquis and lasix. Reviewed EMR, has h/o chronic non-occlusive DVT LLE.  BLE currently symmetric, non inflamed, without evidence of infection or DVT. MAR shows eliquis compliance. No [place] indicated at this time, very low suspicion of DVT. Will check labs and CXR to assess volume status. Clear lungs on exam.    [PS]  1740 Labs so far unremarkable.  Cxr without pulm edema or signs of decompensated heart failure. Still awaiting additional lab workup.    [PS]  1806 Bnp negative also. Will DC home to f/u heart failure clinic.   B Natriuretic Peptide: 68.0 [PS]    Clinical Course User Index [PS] Carrie Mew, MD     ----------------------------------------- 6:17 PM on 04/25/2018 -----------------------------------------  Additional dose of Lasix ordered IV in the ED to help improve his symptoms.  Instructed him to increase his Lasix to 2 times a day for the next 3 days while following up with heart failure clinic and cardiology.  ____________________________________________   FINAL CLINICAL IMPRESSION(S) / ED DIAGNOSES    Final diagnoses:  Peripheral edema     ED Discharge Orders    None      Portions of this note were generated with dragon dictation software. Dictation  errors may occur despite best attempts at proofreading.    Carrie Mew, MD 04/25/18 [phone]

## 2018-04-26 NOTE — ED Provider Notes (Signed)
I did not evaluate the patient. However per discharge instructions Dr. Joni Fears did want patient to receive lasix 20mg  BID for three days. Living facility requires prescription to give medication.    Nance Pear, MD 04/26/18 [phone]

## 2018-05-14 ENCOUNTER — Other Ambulatory Visit: Payer: Self-pay

## 2018-05-14 ENCOUNTER — Encounter: Payer: Self-pay | Admitting: Emergency Medicine

## 2018-05-14 ENCOUNTER — Emergency Department
Admission: EM | Admit: 2018-05-14 | Discharge: 2018-05-14 | Disposition: A | Discharge status: Home / Self Care | Payer: Medicare Other | Attending: Emergency Medicine | Admitting: Emergency Medicine

## 2018-05-14 DIAGNOSIS — S41111A Laceration without foreign body of right upper arm, initial encounter: Secondary | ICD-10-CM

## 2018-05-14 DIAGNOSIS — Z79899 Other long term (current) drug therapy: Secondary | ICD-10-CM | POA: Diagnosis not present

## 2018-05-14 DIAGNOSIS — Y9389 Activity, other specified: Secondary | ICD-10-CM | POA: Diagnosis not present

## 2018-05-14 DIAGNOSIS — I10 Essential (primary) hypertension: Secondary | ICD-10-CM | POA: Diagnosis not present

## 2018-05-14 DIAGNOSIS — Y92122 Bedroom in nursing home as the place of occurrence of the external cause: Secondary | ICD-10-CM | POA: Insufficient documentation

## 2018-05-14 DIAGNOSIS — W19XXXA Unspecified fall, initial encounter: Secondary | ICD-10-CM

## 2018-05-14 DIAGNOSIS — F0390 Unspecified dementia without behavioral disturbance: Secondary | ICD-10-CM | POA: Insufficient documentation

## 2018-05-14 DIAGNOSIS — W06XXXA Fall from bed, initial encounter: Secondary | ICD-10-CM | POA: Diagnosis not present

## 2018-05-14 DIAGNOSIS — Y998 Other external cause status: Secondary | ICD-10-CM | POA: Insufficient documentation

## 2018-05-14 DIAGNOSIS — S4991XA Unspecified injury of right shoulder and upper arm, initial encounter: Secondary | ICD-10-CM | POA: Diagnosis present

## 2018-05-14 NOTE — ED Triage Notes (Signed)
EMS pt to Rm 5 from [facility] with report of pt stood up from the bed this morning and fell. Pt normally is wheelchair bound per EMS. Pt has skin tear to the back of his right upper arm and at his right elbow. Pt denies pain when asked but has hx of dementia.

## 2018-05-14 NOTE — ED Notes (Signed)
E-signature not obtained d/t pt has dementia

## 2018-05-14 NOTE — ED Notes (Addendum)
April Goggings (legal guardian) notified of pt arrival/fall/and discharge - she requested that EMS be called to transport back to facility

## 2018-05-14 NOTE — ED Provider Notes (Signed)
[facility] Emergency Department Provider Note  ____________________________________________  Time seen: Approximately 7:41 AM  I have reviewed the triage vital signs and the nursing notes.   HISTORY  Chief Complaint Fall  Level 5 Caveat: Portions of the History and Physical including HPI and review of systems are unable to be completely obtained due to patient being a poor historian due to advanced dementia   HPI Charles Nelson is a 84 y.o. male from family care home with a history of DVT, dementia, hypertension who was sent to the ED today after a fall.  Patient fell out of bed to the floor.  He is known to be wheelchair-bound and unsafe to ambulate.  Staff reported that patient had decided this morning that he would try to ambulate with a walker as he had in the past.  No known head injury or loss of consciousness.  Patient denies any symptoms.      Past Medical History:  Diagnosis Date  . DVT (deep venous thrombosis) ([place])   . Insomnia   . Memory loss   . Neuropathy      Patient Active Problem List   Diagnosis Date Noted  . Syncope 12/31/2017  . Sepsis ([place]) 12/31/2017  . Leg swelling 11/11/2017  . Essential hypertension 11/11/2017  . Pure hypercholesterolemia 11/11/2017  . Confirmed venous thromboembolism (VTE) 06/27/2017  . DVT (deep venous thrombosis) ([place]) 08/31/2016     Past Surgical History:  Procedure Laterality Date  . pt does not remember surgery hx       Prior to Admission medications   Medication Sig Start Date End Date Taking? Authorizing Provider  acetaminophen (TYLENOL) 500 MG tablet Take 500 mg by mouth every morning.     [provider]  acetaminophen (TYLENOL) 500 MG tablet Take 1,000 mg by mouth 3 (three) times daily as needed for mild pain or moderate pain.    [provider]  amoxicillin-clavulanate (AUGMENTIN) 875-125 MG tablet Take 1 tablet by mouth every 12 (twelve) hours. Patient not taking:  Reported on 03/02/2018 01/02/18   Demetrios Loll, MD  apixaban (ELIQUIS) 5 MG TABS tablet Take 5 mg by mouth daily.    [provider]  cholecalciferol (VITAMIN D) 1000 units tablet Take 2,000 Units by mouth daily.     [provider]  docusate sodium (COLACE) 100 MG capsule Take 100 mg by mouth 2 (two) times daily as needed for mild constipation.    [provider]  furosemide (LASIX) 40 MG tablet Take 0.5 tablets (20 mg total) by mouth daily. 03/02/18 03/02/19  Merlyn Lot, MD  Melatonin 10 MG TABS Take 10 mg by mouth at bedtime.    [provider]  Multiple Vitamins-Minerals (THERAVIM-M) TABS Take 1 tablet by mouth daily.    [provider]  pantoprazole (PROTONIX) 40 MG tablet Take 1 tablet (40 mg total) by mouth daily. 01/02/18   Demetrios Loll, MD  polyethylene glycol [facility] / Floria Raveling) packet Take 17 g by mouth daily as needed for mild constipation. Patient taking differently: Take 17 g by mouth daily.  06/30/17   Vaughan Basta, MD  pravastatin (PRAVACHOL) 40 MG tablet Take 40 mg by mouth at bedtime.     [provider]  risperiDONE (RISPERDAL) 2 MG tablet Take 2 mg by mouth at bedtime.    [provider]  tiZANidine (ZANAFLEX) 4 MG capsule Take 4 mg by mouth 3 (three) times daily.    [provider]  vitamin B-12 (CYANOCOBALAMIN) 1000  MCG tablet Take 2,000 mcg by mouth daily.    [provider]  vitamin C (ASCORBIC ACID) 500 MG tablet Take 500 mg by mouth 2 (two) times daily.    [provider]  zinc sulfate 220 (50 Zn) MG capsule Take 220 mg by mouth daily.    [provider]     Allergies Patient has no known allergies.   Family History  Problem Relation Age of Onset  . Diabetes Neg Hx   . Hypertension Neg Hx   . Deep vein thrombosis Neg Hx     Social History Social History   Tobacco Use  . Smoking status: Never Smoker  . Smokeless tobacco: Never Used  Substance Use Topics   . Alcohol use: No  . Drug use: No    Review of Systems Level 5 Caveat: Portions of the History and Physical including HPI and review of systems are unable to be completely obtained due to patient being a poor historian due to advanced dementia   ____________________________________________   PHYSICAL EXAM:  VITAL SIGNS: ED Triage Vitals  Enc Vitals Group     BP 05/14/18 0658 117/69     Pulse Rate 05/14/18 0658 90     Resp 05/14/18 0658 18     Temp 05/14/18 0658 98.1 F (36.7 C)     Temp Source 05/14/18 0658 Oral     SpO2 05/14/18 0654 96 %     Weight 05/14/18 0659 167 lb 8 oz (76 kg)     Height 05/14/18 0659 6' (1.829 m)     Head Circumference --      Peak Flow --      Pain Score --      Pain Loc --      Pain Edu? --      Excl. in [place]? --     Vital signs reviewed, nursing assessments reviewed.   Constitutional: Awake and alert.  Not oriented.  Non-toxic appearance. Eyes:   Conjunctivae are normal. EOMI. PERRL. ENT      Head:   Normocephalic and atraumatic.      Nose:   No congestion/rhinnorhea.       Mouth/Throat:   MMM, no pharyngeal erythema. No peritonsillar mass.       Neck:   No meningismus. Full ROM.  No midline tenderness Hematological/Lymphatic/Immunilogical:   No cervical lymphadenopathy. Cardiovascular:   RRR. Symmetric bilateral radial and DP pulses.  No murmurs. Cap refill less than 2 seconds. Respiratory:   Normal respiratory effort without tachypnea/retractions. Breath sounds are clear and equal bilaterally. No wheezes/rales/rhonchi. Gastrointestinal:   Soft and nontender. Non distended. There is no CVA tenderness.  No rebound, rigidity, or guarding.  Musculoskeletal:   Normal range of motion in all extremities. No joint effusions.  No lower extremity tenderness.  No edema. Neurologic:   Normal speech and language.  Motor grossly intact. No acute focal neurologic deficits are appreciated.  Skin:    Skin is warm, dry with a small skin tear  approximately 2 cm over the right upper arm.  No laceration.  Hemostatic. ____________________________________________    LABS (pertinent positives/negatives) (all labs ordered are listed, but only abnormal results are displayed) Labs Reviewed - No data to display ____________________________________________   EKG  Interpreted by me Sinus rhythm rate of 86, normal axis.  Right bundle branch block.  Voltage criteria for LVH.  No acute ischemic changes.  ____________________________________________    RADIOLOGY  No results found.  ____________________________________________   PROCEDURES Procedures  ____________________________________________    CLINICAL IMPRESSION / ASSESSMENT AND PLAN / ED COURSE  Pertinent labs & imaging results that were available during my care of the patient were reviewed by me and considered in my medical decision making (see chart for details).    Patient sent to the ED for evaluation after a fall.  No known symptoms.  Only significant finding on exam is a small skin tear on the right upper arm.  No evidence of serious traumatic injury.  Long bones are all stable, joints are nontender with intact range of motion.  At this time the patient is medically stable for discharge home, no further work-up or imaging indicated.  The patient's Eliquis use does raise the possibility of an occult intracranial hemorrhage, but even still there is no evidence of head trauma, and the patient would be an unlikely candidate for surgical intervention in the event that he did have an injury, making imaging moot.      ____________________________________________   FINAL CLINICAL IMPRESSION(S) / ED DIAGNOSES    Final diagnoses:  Fall, initial encounter  Skin tear of right upper arm without complication, initial encounter     ED Discharge Orders    None      Portions of this note were generated with dragon dictation software. Dictation errors may occur  despite best attempts at proofreading.    Carrie Mew, MD 05/14/18 [phone]

## 2018-06-29 ENCOUNTER — Emergency Department
Admission: EM | Admit: 2018-06-29 | Discharge: 2018-06-29 | Disposition: A | Discharge status: Home / Self Care | Payer: Medicare Other | Attending: Emergency Medicine | Admitting: Emergency Medicine

## 2018-06-29 ENCOUNTER — Other Ambulatory Visit: Payer: Self-pay

## 2018-06-29 ENCOUNTER — Emergency Department: Payer: Medicare Other

## 2018-06-29 DIAGNOSIS — Z79899 Other long term (current) drug therapy: Secondary | ICD-10-CM | POA: Insufficient documentation

## 2018-06-29 DIAGNOSIS — F0390 Unspecified dementia without behavioral disturbance: Secondary | ICD-10-CM | POA: Diagnosis not present

## 2018-06-29 DIAGNOSIS — Y999 Unspecified external cause status: Secondary | ICD-10-CM | POA: Diagnosis not present

## 2018-06-29 DIAGNOSIS — Y9301 Activity, walking, marching and hiking: Secondary | ICD-10-CM | POA: Diagnosis not present

## 2018-06-29 DIAGNOSIS — Z7901 Long term (current) use of anticoagulants: Secondary | ICD-10-CM | POA: Insufficient documentation

## 2018-06-29 DIAGNOSIS — I10 Essential (primary) hypertension: Secondary | ICD-10-CM | POA: Diagnosis not present

## 2018-06-29 DIAGNOSIS — S0990XA Unspecified injury of head, initial encounter: Secondary | ICD-10-CM

## 2018-06-29 DIAGNOSIS — W19XXXA Unspecified fall, initial encounter: Secondary | ICD-10-CM | POA: Diagnosis not present

## 2018-06-29 DIAGNOSIS — Y92129 Unspecified place in nursing home as the place of occurrence of the external cause: Secondary | ICD-10-CM | POA: Insufficient documentation

## 2018-06-29 DIAGNOSIS — S51801A Unspecified open wound of right forearm, initial encounter: Secondary | ICD-10-CM | POA: Diagnosis not present

## 2018-06-29 MED ORDER — BACITRACIN ZINC 500 UNIT/GM EX OINT
TOPICAL_OINTMENT | CUTANEOUS | Status: AC
  Filled 2018-06-29: qty 0.9

## 2018-06-29 MED ORDER — BACITRACIN ZINC 500 UNIT/GM EX OINT
TOPICAL_OINTMENT | Freq: Two times a day (BID) | CUTANEOUS | Status: DC
  Administered 2018-06-29: 04:00:00 via TOPICAL

## 2018-06-29 NOTE — ED Triage Notes (Signed)
Pt arrived via [facility] from [facility] with c/o mechanical fall. EMS states pt tries to get up on own all the time when he is by himself and caretaker at group home said he fell trying to walk by himself. Ems states pt has dementia and had LOC.

## 2018-06-29 NOTE — ED Notes (Signed)
Bacitracin applied to right forearm where skin tear was as well as dressing over wound. MD not able to suture due to thin skin and location of wound. Facility has been notified of patient on the way as well as Legal Guardian of discharge. Esignature not available due to pt having dementia.

## 2018-06-29 NOTE — ED Provider Notes (Signed)
[facility] Emergency Department Provider Note   First MD Initiated Contact with Patient 06/29/18 0128     (approximate)  I have reviewed the triage vital signs and the nursing notes.   HISTORY  Chief Complaint Fall    HPI Charles Nelson is a 84 y.o. male with below list of chronic medical conditions including "memory loss dementia presents to the emergency department via EMS from [facility] secondary to a "mechanical fall".  Per EMS the patient tries to get up on his own and often times fall.  EMS states that the caretaker at the home stated that the patient fell while trying to walk by himself.  EMS states that the patient did have loss of consciousness.  Patient denies any complaints at present.  Patient denies any recollection of the fall.  Past Medical History:  Diagnosis Date  . DVT (deep venous thrombosis) ([place])   . Insomnia   . Memory loss   . Neuropathy     Patient Active Problem List   Diagnosis Date Noted  . Syncope 12/31/2017  . Sepsis ([place]) 12/31/2017  . Leg swelling 11/11/2017  . Essential hypertension 11/11/2017  . Pure hypercholesterolemia 11/11/2017  . Confirmed venous thromboembolism (VTE) 06/27/2017  . DVT (deep venous thrombosis) ([place]) 08/31/2016    Past Surgical History:  Procedure Laterality Date  . pt does not remember surgery hx      Prior to Admission medications   Medication Sig Start Date End Date Taking? Authorizing Provider  acetaminophen (TYLENOL) 500 MG tablet Take 500 mg by mouth every morning.     [provider]  acetaminophen (TYLENOL) 500 MG tablet Take 1,000 mg by mouth 3 (three) times daily as needed for mild pain or moderate pain.    [provider]  amoxicillin-clavulanate (AUGMENTIN) 875-125 MG tablet Take 1 tablet by mouth every 12 (twelve) hours. Patient not taking: Reported on 03/02/2018 01/02/18   Demetrios Loll, MD  apixaban (ELIQUIS) 5 MG TABS tablet Take 5 mg by mouth daily.     [provider]  cholecalciferol (VITAMIN D) 1000 units tablet Take 2,000 Units by mouth daily.     [provider]  docusate sodium (COLACE) 100 MG capsule Take 100 mg by mouth 2 (two) times daily as needed for mild constipation.    [provider]  furosemide (LASIX) 40 MG tablet Take 0.5 tablets (20 mg total) by mouth daily. 03/02/18 03/02/19  Merlyn Lot, MD  Melatonin 10 MG TABS Take 10 mg by mouth at bedtime.    [provider]  Multiple Vitamins-Minerals (THERAVIM-M) TABS Take 1 tablet by mouth daily.    [provider]  pantoprazole (PROTONIX) 40 MG tablet Take 1 tablet (40 mg total) by mouth daily. 01/02/18   Demetrios Loll, MD  polyethylene glycol [facility] / Floria Raveling) packet Take 17 g by mouth daily as needed for mild constipation. Patient taking differently: Take 17 g by mouth daily.  06/30/17   Vaughan Basta, MD  pravastatin (PRAVACHOL) 40 MG tablet Take 40 mg by mouth at bedtime.     [provider]  risperiDONE (RISPERDAL) 2 MG tablet Take 2 mg by mouth at bedtime.    [provider]  tiZANidine (ZANAFLEX) 4 MG capsule Take 4 mg by mouth 3 (three) times daily.    [provider]  vitamin B-12 (CYANOCOBALAMIN) 1000 MCG tablet Take 2,000 mcg by mouth daily.    [provider]  vitamin C (ASCORBIC ACID) 500 MG  tablet Take 500 mg by mouth 2 (two) times daily.    [provider]  zinc sulfate 220 (50 Zn) MG capsule Take 220 mg by mouth daily.    [provider]    Allergies No known drug allergies  Family History  Problem Relation Age of Onset  . Diabetes Neg Hx   . Hypertension Neg Hx   . Deep vein thrombosis Neg Hx     Social History Social History   Tobacco Use  . Smoking status: Never Smoker  . Smokeless tobacco: Never Used  Substance Use Topics  . Alcohol use: No  . Drug use: No    Review of Systems Constitutional: No fever/chills Eyes: No visual changes. ENT:  No sore throat. Cardiovascular: Denies chest pain. Respiratory: Denies shortness of breath. Gastrointestinal: No abdominal pain.  No nausea, no vomiting.  No diarrhea.  No constipation. Genitourinary: Negative for dysuria. Musculoskeletal: Negative for neck pain.  Negative for back pain. Integumentary: Positive for abrasion to the arm Neurological: Negative for headaches, focal weakness or numbness.   ____________________________________________   PHYSICAL EXAM:  VITAL SIGNS: ED Triage Vitals  Enc Vitals Group     BP 06/29/18 0123 (!) 152/86     Pulse Rate 06/29/18 0123 73     Resp 06/29/18 0123 19     Temp 06/29/18 0123 98.5 F (36.9 C)     Temp Source 06/29/18 0123 Oral     SpO2 06/29/18 0123 96 %     Weight 06/29/18 0125 76 kg (167 lb 8.8 oz)     Height 06/29/18 0125 1.778 m (5\' 10" )     Head Circumference --      Peak Flow --      Pain Score --      Pain Loc --      Pain Edu? --      Excl. in [place]? --     Constitutional: Alert and oriented. Well appearing and in no acute distress. Eyes: Conjunctivae are normal. PERRL. EOMI. Head: Atraumatic. Mouth/Throat: Mucous membranes are moist. Oropharynx non-erythematous. Neck: No stridor.  No meningeal signs.  No cervical spine tenderness to palpation. Cardiovascular: Normal rate, regular rhythm. Good peripheral circulation. Grossly normal heart sounds. Respiratory: Normal respiratory effort.  No retractions. Lungs CTAB. Gastrointestinal: Soft and nontender. No distention.  Musculoskeletal: No lower extremity tenderness nor edema. No gross deformities of extremities. Neurologic:  Normal speech and language. No gross focal neurologic deficits are appreciated.  Skin: 3 cm linear skin tear to the medial right arm Psychiatric: Mood and affect are normal. Speech and behavior are normal.    RADIOLOGY I, [place] N BROWN, personally viewed and evaluated these images (plain radiographs) as part of my medical decision making, as  well as reviewing the written report by the radiologist.  ED MD interpretation: CT head and cervical spine revealed no acute findings  Official radiology report(s): Ct Head Wo Contrast  Result Date: 06/29/2018 CLINICAL DATA:  Fall.  Dementia. EXAM: CT HEAD WITHOUT CONTRAST CT CERVICAL SPINE WITHOUT CONTRAST TECHNIQUE: Multidetector CT imaging of the head and cervical spine was performed following the standard protocol without intravenous contrast. Multiplanar CT image reconstructions of the cervical spine were also generated. COMPARISON:  Head CT 12/31/2017 FINDINGS: CT HEAD FINDINGS Brain: Ventricles, cisterns and other CSF spaces are within normal as there is mild age related atrophy present. There is mild chronic ischemic microvascular disease. There is no mass, mass effect, shift of midline structures or acute hemorrhage. No evidence of  acute infarction. Vascular: No hyperdense vessel or unexpected calcification. Skull: Normal. Negative for fracture or focal lesion. Sinuses/Orbits: Orbits are within normal. Paranasal sinuses are well developed and well aerated with mild opacification over the left ethmoid air cells. Other: None. CT CERVICAL SPINE FINDINGS Alignment: Normal. Skull base and vertebrae: Mild spondylosis throughout the cervical spine. There is uncovertebral joint spurring and facet arthropathy. Atlantoaxial articulation is normal. Vertebral body heights are maintained. There is significant bilateral neural foraminal narrowing at multiple levels due to adjacent bony spurring. No acute fracture or subluxation. Soft tissues and spinal canal: No prevertebral fluid or swelling. No visible canal hematoma. Disc levels:  Disc space narrowing at the C6-7 level. Upper chest: No acute findings. Other: None. IMPRESSION: No acute brain injury. Chronic ischemic microvascular disease and age related atrophy. No acute cervical spine injury. Mild spondylosis of the cervical spine with disc space narrowing at  the C6-7 level. Bilateral neural foraminal narrowing at multiple levels over the mid to lower cervical spine. Minimal chronic sinus inflammatory change. Electronically Signed   By: Marin Olp M.D.   On: 06/29/2018 02:41   Ct Cervical Spine Wo Contrast  Result Date: 06/29/2018 CLINICAL DATA:  Fall.  Dementia. EXAM: CT HEAD WITHOUT CONTRAST CT CERVICAL SPINE WITHOUT CONTRAST TECHNIQUE: Multidetector CT imaging of the head and cervical spine was performed following the standard protocol without intravenous contrast. Multiplanar CT image reconstructions of the cervical spine were also generated. COMPARISON:  Head CT 12/31/2017 FINDINGS: CT HEAD FINDINGS Brain: Ventricles, cisterns and other CSF spaces are within normal as there is mild age related atrophy present. There is mild chronic ischemic microvascular disease. There is no mass, mass effect, shift of midline structures or acute hemorrhage. No evidence of acute infarction. Vascular: No hyperdense vessel or unexpected calcification. Skull: Normal. Negative for fracture or focal lesion. Sinuses/Orbits: Orbits are within normal. Paranasal sinuses are well developed and well aerated with mild opacification over the left ethmoid air cells. Other: None. CT CERVICAL SPINE FINDINGS Alignment: Normal. Skull base and vertebrae: Mild spondylosis throughout the cervical spine. There is uncovertebral joint spurring and facet arthropathy. Atlantoaxial articulation is normal. Vertebral body heights are maintained. There is significant bilateral neural foraminal narrowing at multiple levels due to adjacent bony spurring. No acute fracture or subluxation. Soft tissues and spinal canal: No prevertebral fluid or swelling. No visible canal hematoma. Disc levels:  Disc space narrowing at the C6-7 level. Upper chest: No acute findings. Other: None. IMPRESSION: No acute brain injury. Chronic ischemic microvascular disease and age related atrophy. No acute cervical spine injury.  Mild spondylosis of the cervical spine with disc space narrowing at the C6-7 level. Bilateral neural foraminal narrowing at multiple levels over the mid to lower cervical spine. Minimal chronic sinus inflammatory change. Electronically Signed   By: Marin Olp M.D.   On: 06/29/2018 02:41     Procedures   ____________________________________________   INITIAL IMPRESSION / ASSESSMENT AND PLAN / ED COURSE  As part of my medical decision making, I reviewed the following data within the electronic MEDICAL RECORD NUMBER   84 year old male presenting with above-stated history and physical exam secondary to mechanical fall.  Patient with a skin tear to the arm dressing applied.  CT head cervical spine revealed no acute findings ____________________________________________  FINAL CLINICAL IMPRESSION(S) / ED DIAGNOSES  Final diagnoses:  Fall, initial encounter  Injury of head, initial encounter     MEDICATIONS GIVEN DURING THIS VISIT:  Medications  bacitracin ointment ( Topical Given  06/29/18 0427)     ED Discharge Orders    None       Note:  This document was prepared using Dragon voice recognition software and may include unintentional dictation errors.    Gregor Hams, MD 06/29/18 [phone]

## 2018-07-31 ENCOUNTER — Other Ambulatory Visit: Payer: Self-pay

## 2018-07-31 ENCOUNTER — Emergency Department
Admission: EM | Admit: 2018-07-31 | Discharge: 2018-07-31 | Disposition: A | Discharge status: Home / Self Care | Payer: Medicare Other | Attending: Emergency Medicine | Admitting: Emergency Medicine

## 2018-07-31 ENCOUNTER — Emergency Department: Payer: Medicare Other

## 2018-07-31 DIAGNOSIS — Z7901 Long term (current) use of anticoagulants: Secondary | ICD-10-CM | POA: Diagnosis not present

## 2018-07-31 DIAGNOSIS — Z79899 Other long term (current) drug therapy: Secondary | ICD-10-CM | POA: Diagnosis not present

## 2018-07-31 DIAGNOSIS — W19XXXA Unspecified fall, initial encounter: Secondary | ICD-10-CM | POA: Diagnosis not present

## 2018-07-31 DIAGNOSIS — Z043 Encounter for examination and observation following other accident: Secondary | ICD-10-CM | POA: Diagnosis present

## 2018-07-31 DIAGNOSIS — Z7902 Long term (current) use of antithrombotics/antiplatelets: Secondary | ICD-10-CM | POA: Insufficient documentation

## 2018-07-31 DIAGNOSIS — F0390 Unspecified dementia without behavioral disturbance: Secondary | ICD-10-CM | POA: Insufficient documentation

## 2018-07-31 DIAGNOSIS — Y9389 Activity, other specified: Secondary | ICD-10-CM | POA: Insufficient documentation

## 2018-07-31 DIAGNOSIS — Y92019 Unspecified place in single-family (private) house as the place of occurrence of the external cause: Secondary | ICD-10-CM | POA: Insufficient documentation

## 2018-07-31 DIAGNOSIS — Y998 Other external cause status: Secondary | ICD-10-CM | POA: Insufficient documentation

## 2018-07-31 DIAGNOSIS — I10 Essential (primary) hypertension: Secondary | ICD-10-CM | POA: Diagnosis not present

## 2018-07-31 NOTE — ED Notes (Signed)
Pt alert, denies pain, able to state name and birthday, unable to states what month it is, his current age, or current situation.

## 2018-07-31 NOTE — ED Notes (Addendum)
Spoke with [name] from [facility] and he stated they do not have a fax machine.  Also stated there was no DNR form on patient's door and thats where they usually are.

## 2018-07-31 NOTE — ED Provider Notes (Signed)
Patient's care home has come to pick him up.  We have been unable to get in touch with the guardian despite multiple attempts but he does have a safe discharge plan.   Carrie Mew, MD 07/31/18 [phone]

## 2018-07-31 NOTE — ED Provider Notes (Signed)
[facility] Emergency Department Provider Note   ____________________________________________   First MD Initiated Contact with Patient 07/31/18 0505     (approximate)  I have reviewed the triage vital signs and the nursing notes.   HISTORY  Chief Complaint Fall  Level V caveat: Limited by dementia  HPI Charles Nelson is a 84 y.o. male brought to the ED via EMS from care home status post unwitnessed fall.  Patient has a history of memory loss dementia, DVT on Eliquis per his records.  Last seen at 2 AM.  Found prior to arrival on the floor beside his bed.  Staff reported to EMS that patient often tries to get up on his own and falls.  He is supposed to ambulate with assistance.  Patient arrives alert and oriented to name which is his baseline; voices no complaints.   Past Medical History:  Diagnosis Date  . DVT (deep venous thrombosis) ([place])   . Insomnia   . Memory loss   . Neuropathy     Patient Active Problem List   Diagnosis Date Noted  . Syncope 12/31/2017  . Sepsis ([place]) 12/31/2017  . Leg swelling 11/11/2017  . Essential hypertension 11/11/2017  . Pure hypercholesterolemia 11/11/2017  . Confirmed venous thromboembolism (VTE) 06/27/2017  . DVT (deep venous thrombosis) ([place]) 08/31/2016    Past Surgical History:  Procedure Laterality Date  . pt does not remember surgery hx      Prior to Admission medications   Medication Sig Start Date End Date Taking? Authorizing Provider  acetaminophen (TYLENOL) 500 MG tablet Take 500 mg by mouth every morning.     [provider]  acetaminophen (TYLENOL) 500 MG tablet Take 1,000 mg by mouth 3 (three) times daily as needed for mild pain or moderate pain.    [provider]  amoxicillin-clavulanate (AUGMENTIN) 875-125 MG tablet Take 1 tablet by mouth every 12 (twelve) hours. Patient not taking: Reported on 03/02/2018 01/02/18   Demetrios Loll, MD  apixaban (ELIQUIS) 5 MG TABS tablet Take 5 mg  by mouth daily.    [provider]  cholecalciferol (VITAMIN D) 1000 units tablet Take 2,000 Units by mouth daily.     [provider]  docusate sodium (COLACE) 100 MG capsule Take 100 mg by mouth 2 (two) times daily as needed for mild constipation.    [provider]  furosemide (LASIX) 40 MG tablet Take 0.5 tablets (20 mg total) by mouth daily. 03/02/18 03/02/19  Merlyn Lot, MD  Melatonin 10 MG TABS Take 10 mg by mouth at bedtime.    [provider]  Multiple Vitamins-Minerals (THERAVIM-M) TABS Take 1 tablet by mouth daily.    [provider]  pantoprazole (PROTONIX) 40 MG tablet Take 1 tablet (40 mg total) by mouth daily. 01/02/18   Demetrios Loll, MD  polyethylene glycol [facility] / Floria Raveling) packet Take 17 g by mouth daily as needed for mild constipation. Patient taking differently: Take 17 g by mouth daily.  06/30/17   Vaughan Basta, MD  pravastatin (PRAVACHOL) 40 MG tablet Take 40 mg by mouth at bedtime.     [provider]  risperiDONE (RISPERDAL) 2 MG tablet Take 2 mg by mouth at bedtime.    [provider]  tiZANidine (ZANAFLEX) 4 MG capsule Take 4 mg by mouth 3 (three) times daily.    [provider]  vitamin B-12 (CYANOCOBALAMIN) 1000 MCG tablet Take 2,000 mcg by mouth daily.    [provider]  vitamin  C (ASCORBIC ACID) 500 MG tablet Take 500 mg by mouth 2 (two) times daily.    [provider]  zinc sulfate 220 (50 Zn) MG capsule Take 220 mg by mouth daily.    [provider]    Allergies Patient has no known allergies.  Family History  Problem Relation Age of Onset  . Diabetes Neg Hx   . Hypertension Neg Hx   . Deep vein thrombosis Neg Hx     Social History Social History   Tobacco Use  . Smoking status: Never Smoker  . Smokeless tobacco: Never Used  Substance Use Topics  . Alcohol use: No  . Drug use: No    Review of Systems  Constitutional: Positive for  unwitnessed fall.  No fever/chills Eyes: No visual changes. ENT: No sore throat. Cardiovascular: Denies chest pain. Respiratory: Denies shortness of breath. Gastrointestinal: No abdominal pain.  No nausea, no vomiting.  No diarrhea.  No constipation. Genitourinary: Negative for dysuria. Musculoskeletal: Negative for back pain. Skin: Negative for rash. Neurological: Negative for headaches, focal weakness or numbness.   ____________________________________________   PHYSICAL EXAM:  VITAL SIGNS: ED Triage Vitals  Enc Vitals Group     BP      Pulse      Resp      Temp      Temp src      SpO2      Weight      Height      Head Circumference      Peak Flow      Pain Score      Pain Loc      Pain Edu?      Excl. in [place]?     Constitutional: Alert and oriented. Well appearing and in no acute distress. Eyes: Conjunctivae are normal. PERRL. EOMI. Head: Atraumatic. Nose: Atraumatic. Mouth/Throat: Mucous membranes are moist.  No dental malocclusion. Neck: No stridor.  No cervical spine tenderness to palpation. Cardiovascular: Normal rate, regular rhythm. Grossly normal heart sounds.  Good peripheral circulation. Respiratory: Normal respiratory effort.  No retractions. Lungs CTAB. Gastrointestinal: Soft and nontender. No distention. No abdominal bruits. No CVA tenderness. Musculoskeletal: No spinal tenderness to palpation.  Pelvis stable.  No lower extremity tenderness.  Baseline BLE edema per staff.  No joint effusions. Neurologic:  Normal speech and language. No gross focal neurologic deficits are appreciated.  Skin:  Skin is warm, dry and intact. No rash noted. Psychiatric: Mood and affect are normal. Speech and behavior are normal.  ____________________________________________   LABS (all labs ordered are listed, but only abnormal results are displayed)  Labs Reviewed - No data to display ____________________________________________  EKG  ED ECG REPORT I, Stayce Delancy J,  the attending physician, personally viewed and interpreted this ECG.   Date: 07/31/2018  EKG Time: 0514  Rate: 76  Rhythm: normal EKG, normal sinus rhythm  Axis: Normal  Intervals:none and right bundle branch block  ST&T Change: Nonspecific  ____________________________________________  RADIOLOGY  ED MD interpretation: No ICH  Official radiology report(s): Ct Head Wo Contrast  Result Date: 07/31/2018 CLINICAL DATA:  84 year old male with fall. EXAM: CT HEAD WITHOUT CONTRAST TECHNIQUE: Contiguous axial images were obtained from the base of the skull through the vertex without intravenous contrast. COMPARISON:  Head CT dated 06/29/2018 FINDINGS: Brain: There is mild age-related atrophy and chronic microvascular ischemic changes. There is no acute intracranial hemorrhage. No mass effect or midline shift. No extra-axial fluid collection. Vascular: No hyperdense vessel or unexpected calcification.  Skull: Normal. Negative for fracture or focal lesion. Sinuses/Orbits: No acute finding. Other: None IMPRESSION: 1. No acute intracranial hemorrhage. 2. Age-related atrophy and chronic microvascular ischemic changes. Electronically Signed   By: Anner Crete M.D.   On: 07/31/2018 05:52    ____________________________________________   PROCEDURES  Procedure(s) performed: None  Procedures  Critical Care performed: No  ____________________________________________   INITIAL IMPRESSION / ASSESSMENT AND PLAN / ED COURSE  As part of my medical decision making, I reviewed the following data within the [place] notes reviewed and incorporated, EKG interpreted, Old chart reviewed, Radiograph reviewed and Notes from prior ED visits   84 year old male on Eliquis who presents status post unwitnessed fall at the care home.  History limited by dementia.  Differential diagnosis includes but is not limited to intracranial hemorrhage, mechanical fall, metabolic, infectious  etiologies, etc.  Staff reported to EMS that patient is at his baseline mental state.  Given that he is on Eliquis per [facility], will obtain CT head to evaluate for intracranial hemorrhage.    Clinical Course as of Jul 31 557  Mon Jul 31, 2018  5809 Updated patient on negative CT head.  Will discharge back to facility.  Strict return precautions given.  Patient verbalizes understanding and agrees with plan of care.   [JS]    Clinical Course User Index [JS] Paulette Blanch, MD     ____________________________________________   FINAL CLINICAL IMPRESSION(S) / ED DIAGNOSES  Final diagnoses:  Fall, initial encounter     ED Discharge Orders    None       Note:  This document was prepared using Dragon voice recognition software and may include unintentional dictation errors.    Paulette Blanch, MD 07/31/18 [phone]

## 2018-07-31 NOTE — ED Notes (Signed)
Spoke with [name] at [facility] regarding patient and requested that patient's medication list be faxed to the ED and to check with the nurse to determine patient's code status.

## 2018-07-31 NOTE — ED Notes (Signed)
AAOx3.  Skin warm and dry.  NAD 

## 2018-07-31 NOTE — Discharge Instructions (Addendum)
Continue all medications as directed by your doctor.  Return to the ER for worsening symptoms, persistent vomiting, lethargy or other concerns.

## 2018-07-31 NOTE — ED Notes (Signed)
Spoke to [name] at [facility]. Charles Nelson states he will be able to come transport pt back to care center around 0830.

## 2018-07-31 NOTE — ED Triage Notes (Signed)
Patient to [facility] via EMS from local care facility.  Per EMS it was reported to them that patient last seen in bed at 2 am.  Prior to arrival patient was found in floor.  Patient states unsure if he stood up and fell or if he rolled out of bed.  EMS reports facility reported history of falls and that he uses a walker.

## 2018-08-22 ENCOUNTER — Emergency Department: Payer: Medicare Other

## 2018-08-22 ENCOUNTER — Other Ambulatory Visit: Payer: Self-pay

## 2018-08-22 ENCOUNTER — Emergency Department
Admission: EM | Admit: 2018-08-22 | Discharge: 2018-08-23 | Disposition: A | Discharge status: Home / Self Care | Payer: Medicare Other | Attending: Emergency Medicine | Admitting: Emergency Medicine

## 2018-08-22 DIAGNOSIS — Z7901 Long term (current) use of anticoagulants: Secondary | ICD-10-CM | POA: Diagnosis not present

## 2018-08-22 DIAGNOSIS — R109 Unspecified abdominal pain: Secondary | ICD-10-CM | POA: Insufficient documentation

## 2018-08-22 DIAGNOSIS — R531 Weakness: Secondary | ICD-10-CM

## 2018-08-22 DIAGNOSIS — Z79899 Other long term (current) drug therapy: Secondary | ICD-10-CM | POA: Insufficient documentation

## 2018-08-22 DIAGNOSIS — I10 Essential (primary) hypertension: Secondary | ICD-10-CM | POA: Diagnosis not present

## 2018-08-22 LAB — COMPREHENSIVE METABOLIC PANEL
ALT: 16 U/L (ref 0–44)
ANION GAP: 13 (ref 5–15)
AST: 26 U/L (ref 15–41)
Albumin: 3.5 g/dL (ref 3.5–5.0)
Alkaline Phosphatase: 116 U/L (ref 38–126)
BILIRUBIN TOTAL: 0.6 mg/dL (ref 0.3–1.2)
BUN: 18 mg/dL (ref 8–23)
CALCIUM: 8.6 mg/dL — AB (ref 8.9–10.3)
CO2: 26 mmol/L (ref 22–32)
CREATININE: 0.83 mg/dL (ref 0.61–1.24)
Chloride: 101 mmol/L (ref 98–111)
GFR calc non Af Amer: >60 mL/min (ref >60)
Glucose, Bld: 145 mg/dL — ABNORMAL HIGH (ref 70–99)
Potassium: 3.6 mmol/L (ref 3.5–5.1)
Sodium: 140 mmol/L (ref 135–145)
TOTAL PROTEIN: 6.7 g/dL (ref 6.5–8.1)

## 2018-08-22 LAB — CBC WITH DIFFERENTIAL/PLATELET
ABS IMMATURE GRANULOCYTES: 0.08 10*3/uL — AB (ref 0.00–0.07)
Basophils Absolute: 0 10*3/uL (ref 0.0–0.1)
Basophils Relative: 0 %
EOS PCT: 0 %
Eosinophils Absolute: 0 10*3/uL (ref 0.0–0.5)
HEMATOCRIT: 41.9 % (ref 39.0–52.0)
Hemoglobin: 13 g/dL (ref 13.0–17.0)
Immature Granulocytes: 1 %
LYMPHS ABS: 1.4 10*3/uL (ref 0.7–4.0)
LYMPHS PCT: 12 %
MCH: 30.2 pg (ref 26.0–34.0)
MCHC: 31 g/dL (ref 30.0–36.0)
MCV: 97.4 fL (ref 80.0–100.0)
MONO ABS: 1 10*3/uL (ref 0.1–1.0)
MONOS PCT: 9 %
Neutro Abs: 8.7 10*3/uL — ABNORMAL HIGH (ref 1.7–7.7)
Neutrophils Relative %: 78 %
Platelets: 259 10*3/uL (ref 150–400)
RBC: 4.3 MIL/uL (ref 4.22–5.81)
RDW: 13.6 % (ref 11.5–15.5)
WBC: 11.1 10*3/uL — AB (ref 4.0–10.5)
nRBC: 0 % (ref 0.0–0.2)

## 2018-08-22 LAB — URINALYSIS, COMPLETE (UACMP) WITH MICROSCOPIC
Bacteria, UA: NONE SEEN
Bilirubin Urine: NEGATIVE
GLUCOSE, UA: NEGATIVE mg/dL
Ketones, ur: NEGATIVE mg/dL
LEUKOCYTES UA: NEGATIVE
NITRITE: NEGATIVE
PROTEIN: NEGATIVE mg/dL
RBC / HPF: >50 RBC/hpf — ABNORMAL HIGH (ref 0–5)
SPECIFIC GRAVITY, URINE: 1.019 (ref 1.005–1.030)
SQUAMOUS EPITHELIAL / LPF: NONE SEEN (ref 0–5)
pH: 5 (ref 5.0–8.0)

## 2018-08-22 LAB — TSH: TSH: 1.554 u[IU]/mL (ref 0.350–4.500)

## 2018-08-22 LAB — TROPONIN I
TROPONIN I: 0.04 ng/mL — AB (ref <0.03)
Troponin I: 0.06 ng/mL (ref <0.03)

## 2018-08-22 NOTE — ED Triage Notes (Signed)
Pt arrived via ems from [facility] for report of "right side lean x2 days"

## 2018-08-22 NOTE — ED Provider Notes (Signed)
[facility] Emergency Department Provider Note  ____________________________________________   First MD Initiated Contact with Patient 08/22/18 1746     (approximate)  I have reviewed the triage vital signs and the nursing notes.   HISTORY  Chief Complaint Weakness   HPI Charles Nelson is a 84 y.o. male with a history of DVT as well as dementia was presented emergency department today with concern for "leaning to the right" over the past 2 days.  The patient does denies any complaints.  Denies any weakness or numbness.  Denies any pain.  EMS also reports that the patient would lean to the right and then correct himself back to midline and lean to the right again.   Past Medical History:  Diagnosis Date  . DVT (deep venous thrombosis) ([place])   . Insomnia   . Memory loss   . Neuropathy     Patient Active Problem List   Diagnosis Date Noted  . Syncope 12/31/2017  . Sepsis ([place]) 12/31/2017  . Leg swelling 11/11/2017  . Essential hypertension 11/11/2017  . Pure hypercholesterolemia 11/11/2017  . Confirmed venous thromboembolism (VTE) 06/27/2017  . DVT (deep venous thrombosis) ([place]) 08/31/2016    Past Surgical History:  Procedure Laterality Date  . pt does not remember surgery hx      Prior to Admission medications   Medication Sig Start Date End Date Taking? Authorizing Provider  acetaminophen (TYLENOL) 500 MG tablet Take 500 mg by mouth every morning.     [provider]  acetaminophen (TYLENOL) 500 MG tablet Take 1,000 mg by mouth 3 (three) times daily as needed for mild pain or moderate pain.    [provider]  amoxicillin-clavulanate (AUGMENTIN) 875-125 MG tablet Take 1 tablet by mouth every 12 (twelve) hours. Patient not taking: Reported on 03/02/2018 01/02/18   Demetrios Loll, MD  apixaban (ELIQUIS) 5 MG TABS tablet Take 5 mg by mouth daily.    [provider]  cholecalciferol (VITAMIN D) 1000 units tablet Take 2,000  Units by mouth daily.     [provider]  docusate sodium (COLACE) 100 MG capsule Take 100 mg by mouth 2 (two) times daily as needed for mild constipation.    [provider]  furosemide (LASIX) 40 MG tablet Take 0.5 tablets (20 mg total) by mouth daily. 03/02/18 03/02/19  Merlyn Lot, MD  Melatonin 10 MG TABS Take 10 mg by mouth at bedtime.    [provider]  Multiple Vitamins-Minerals (THERAVIM-M) TABS Take 1 tablet by mouth daily.    [provider]  pantoprazole (PROTONIX) 40 MG tablet Take 1 tablet (40 mg total) by mouth daily. 01/02/18   Demetrios Loll, MD  polyethylene glycol [facility] / Floria Raveling) packet Take 17 g by mouth daily as needed for mild constipation. Patient taking differently: Take 17 g by mouth daily.  06/30/17   Vaughan Basta, MD  pravastatin (PRAVACHOL) 40 MG tablet Take 40 mg by mouth at bedtime.     [provider]  risperiDONE (RISPERDAL) 2 MG tablet Take 2 mg by mouth at bedtime.    [provider]  tiZANidine (ZANAFLEX) 4 MG capsule Take 4 mg by mouth 3 (three) times daily.    [provider]  vitamin B-12 (CYANOCOBALAMIN) 1000 MCG tablet Take 2,000 mcg by mouth daily.    [provider]  vitamin C (ASCORBIC ACID) 500 MG tablet Take 500 mg by mouth 2 (two) times daily.    [provider]  zinc sulfate  220 (50 Zn) MG capsule Take 220 mg by mouth daily.    [provider]    Allergies Patient has no known allergies.  Family History  Problem Relation Age of Onset  . Diabetes Neg Hx   . Hypertension Neg Hx   . Deep vein thrombosis Neg Hx     Social History Social History   Tobacco Use  . Smoking status: Never Smoker  . Smokeless tobacco: Never Used  Substance Use Topics  . Alcohol use: No  . Drug use: No    Review of Systems  Constitutional: No fever/chills Eyes: No visual changes. ENT: No sore throat. Cardiovascular: Denies chest pain. Respiratory: Denies  shortness of breath. Gastrointestinal: No abdominal pain.  No nausea, no vomiting.  No diarrhea.  No constipation. Genitourinary: Negative for dysuria. Musculoskeletal: Negative for back pain. Skin: Negative for rash. Neurological: Negative for headaches, focal weakness or numbness.   ____________________________________________   PHYSICAL EXAM:  VITAL SIGNS: ED Triage Vitals  Enc Vitals Group     BP 08/22/18 1746 132/77     Pulse Rate 08/22/18 1738 87     Resp 08/22/18 1738 16     Temp 08/22/18 1738 99.4 F (37.4 C)     Temp Source 08/22/18 1738 Oral     SpO2 08/22/18 1738 95 %     Weight 08/22/18 1736 180 lb (81.6 kg)     Height 08/22/18 1736 6' (1.829 m)     Head Circumference --      Peak Flow --      Pain Score 08/22/18 1736 0     Pain Loc --      Pain Edu? --      Excl. in [place]? --     Constitutional: Alert and oriented to self as well as location but not to year. Well appearing and in no acute distress. Eyes: Conjunctivae are normal.  Head: Atraumatic. Nose: No congestion/rhinnorhea. Mouth/Throat: Mucous membranes are moist.  Neck: No stridor.   Cardiovascular: Normal rate, regular rhythm. Grossly normal heart sounds.   Respiratory: Normal respiratory effort.  No retractions. Lungs CTAB. Gastrointestinal: Soft and nontender. No distention. Musculoskeletal: No lower extremity tenderness nor edema.  No joint effusions. Neurologic:  Normal speech and language. No gross focal neurologic deficits are appreciated. Skin:  Skin is warm, dry and intact. No rash noted. Psychiatric: Mood and affect are normal. Speech and behavior are normal.  ____________________________________________   LABS (all labs ordered are listed, but only abnormal results are displayed)  Labs Reviewed  CBC WITH DIFFERENTIAL/PLATELET - Abnormal; Notable for the following components:      Result Value   WBC 11.1 (*)    Neutro Abs 8.7 (*)    Abs Immature Granulocytes 0.08 (*)    All other  components within normal limits  COMPREHENSIVE METABOLIC PANEL - Abnormal; Notable for the following components:   Glucose, Bld 145 (*)    Calcium 8.6 (*)    All other components within normal limits  URINALYSIS, COMPLETE (UACMP) WITH MICROSCOPIC - Abnormal; Notable for the following components:   Color, Urine YELLOW (*)    APPearance CLEAR (*)    Hgb urine dipstick MODERATE (*)    RBC / HPF >50 (*)    All other components within normal limits  TROPONIN I - Abnormal; Notable for the following components:   Troponin I 0.04 (*)    All other components within normal limits  TSH  TROPONIN I   ____________________________________________  EKG  ED ECG REPORT I, Doran Stabler, the attending physician, personally viewed and interpreted this ECG.   Date: 08/22/2018  EKG Time: 1738  Rate: 83  Rhythm: normal sinus rhythm with multiple premature complexes, ventricular and supraventricular  Axis: Normal  Intervals:right bundle branch block  ST&T Change: No ST segment elevation or depression.  No abnormal T wave inversion. Besides multiple premature complexes there are no significant change from previous. ____________________________________________  RADIOLOGY  No acute intracranial abnormality.  Stable cardiomegaly without active lung disease on the chest x-ray ____________________________________________   PROCEDURES  Procedure(s) performed:   Procedures  Critical Care performed:   ____________________________________________   INITIAL IMPRESSION / ASSESSMENT AND PLAN / ED COURSE  Pertinent labs & imaging results that were available during my care of the patient were reviewed by me and considered in my medical decision making (see chart for details).  Differential diagnosis includes, but is not limited to, alcohol, illicit or prescription medications, or other toxic ingestion; intracranial pathology such as stroke or intracerebral hemorrhage; fever or infectious causes  including sepsis; hypoxemia and/or hypercarbia; uremia; trauma; endocrine related disorders such as diabetes, hypoglycemia, and thyroid-related diseases; hypertensive encephalopathy; etc. As part of my medical decision making, I reviewed the following data within the electronic MEDICAL RECORD NUMBER Notes from prior ED visits  ----------------------------------------- 8:37 PM on 08/22/2018 -----------------------------------------  Patient remains without complaints.  Not leaning to his right side.  No focal weakness on my exam.  Patient with troponin of 0.04 which is very close to his baseline.  We will repeat the troponin and if stable patient will be discharged.  Patient sitting straight up in bed at this time.  He is understanding of the plan as well as the diagnosis and willing to comply. ____________________________________________   FINAL CLINICAL IMPRESSION(S) / ED DIAGNOSES  Weakness.  NEW MEDICATIONS STARTED DURING THIS VISIT:  New Prescriptions   No medications on file     Note:  This document was prepared using Dragon voice recognition software and may include unintentional dictation errors.     Orbie Pyo, MD 08/22/18 2038

## 2018-08-22 NOTE — ED Notes (Signed)
Trop of 0.04. provider made aware.

## 2018-08-22 NOTE — Discharge Instructions (Addendum)
Please discuss with primary care physician the current symptoms and mild elevation of troponin.  Please return for any worsening or concerning symptoms.

## 2018-08-22 NOTE — ED Notes (Signed)
[phone] [facility]

## 2018-08-23 ENCOUNTER — Emergency Department: Payer: Medicare Other

## 2018-08-23 ENCOUNTER — Other Ambulatory Visit: Payer: Self-pay

## 2018-08-23 ENCOUNTER — Emergency Department
Admission: EM | Admit: 2018-08-23 | Discharge: 2018-08-23 | Disposition: A | Discharge status: Skilled Nursing Facility | Payer: Medicare Other | Source: Home / Self Care | Attending: Student in an Organized Health Care Education/Training Program | Admitting: Student in an Organized Health Care Education/Training Program

## 2018-08-23 DIAGNOSIS — N3090 Cystitis, unspecified without hematuria: Secondary | ICD-10-CM

## 2018-08-23 DIAGNOSIS — Z7901 Long term (current) use of anticoagulants: Secondary | ICD-10-CM | POA: Insufficient documentation

## 2018-08-23 DIAGNOSIS — I10 Essential (primary) hypertension: Secondary | ICD-10-CM | POA: Insufficient documentation

## 2018-08-23 DIAGNOSIS — R531 Weakness: Secondary | ICD-10-CM | POA: Insufficient documentation

## 2018-08-23 DIAGNOSIS — Z79899 Other long term (current) drug therapy: Secondary | ICD-10-CM | POA: Insufficient documentation

## 2018-08-23 LAB — CBC WITH DIFFERENTIAL/PLATELET
ABS IMMATURE GRANULOCYTES: 0.07 10*3/uL (ref 0.00–0.07)
BASOS ABS: 0 10*3/uL (ref 0.0–0.1)
Basophils Relative: 0 %
Eosinophils Absolute: 0 10*3/uL (ref 0.0–0.5)
Eosinophils Relative: 0 %
HCT: 43.9 % (ref 39.0–52.0)
HEMOGLOBIN: 13.8 g/dL (ref 13.0–17.0)
IMMATURE GRANULOCYTES: 1 %
LYMPHS PCT: 21 %
Lymphs Abs: 2.5 10*3/uL (ref 0.7–4.0)
MCH: 30.6 pg (ref 26.0–34.0)
MCHC: 31.4 g/dL (ref 30.0–36.0)
MCV: 97.3 fL (ref 80.0–100.0)
Monocytes Absolute: 1.1 10*3/uL — ABNORMAL HIGH (ref 0.1–1.0)
Monocytes Relative: 9 %
NEUTROS ABS: 8.3 10*3/uL — AB (ref 1.7–7.7)
NEUTROS PCT: 69 %
PLATELETS: 208 10*3/uL (ref 150–400)
RBC: 4.51 MIL/uL (ref 4.22–5.81)
RDW: 13.7 % (ref 11.5–15.5)
WBC: 12 10*3/uL — AB (ref 4.0–10.5)
nRBC: 0 % (ref 0.0–0.2)

## 2018-08-23 LAB — BASIC METABOLIC PANEL
ANION GAP: 9 (ref 5–15)
BUN: 17 mg/dL (ref 8–23)
CHLORIDE: 106 mmol/L (ref 98–111)
CO2: 25 mmol/L (ref 22–32)
Calcium: 8.3 mg/dL — ABNORMAL LOW (ref 8.9–10.3)
Creatinine, Ser: 0.94 mg/dL (ref 0.61–1.24)
Glucose, Bld: 152 mg/dL — ABNORMAL HIGH (ref 70–99)
POTASSIUM: 3.4 mmol/L — AB (ref 3.5–5.1)
SODIUM: 140 mmol/L (ref 135–145)

## 2018-08-23 LAB — TROPONIN I: Troponin I: 0.05 ng/mL (ref <0.03)

## 2018-08-23 MED ORDER — CEPHALEXIN 500 MG PO CAPS
500.0000 mg | ORAL_CAPSULE | Freq: Once | ORAL | Status: AC
  Administered 2018-08-23: 500 mg via ORAL
  Filled 2018-08-23: qty 1

## 2018-08-23 MED ORDER — CEPHALEXIN 500 MG PO CAPS: 500.0000 mg | ORAL_CAPSULE | Freq: Three times a day (TID) | ORAL | 0 refills | Status: AC

## 2018-08-23 NOTE — ED Notes (Signed)
EMS is here to transport pt back to [facility].

## 2018-08-23 NOTE — ED Notes (Signed)
Patient transported to CT 

## 2018-08-23 NOTE — ED Notes (Signed)
This RN contacted [facility] [phone]) to inform them of his discharge to which the staff member that was there stated that she has a broken toe and is unable to care for the pt at this moment. The staff member asked if we would be able to care for the pt tonight as though she is unable to leave to come get him and take care of him. Charge RN made aware.

## 2018-08-23 NOTE — ED Provider Notes (Signed)
[facility] Emergency Department Provider Note    First MD Initiated Contact with Patient 08/23/18 1859     (approximate)  I have reviewed the triage vital signs and the nursing notes.   HISTORY  Chief Complaint Weakness  Level V Caveat: dementia  HPI Charles Nelson is a 84 y.o. male presents to the ER for the second time in 24 hours for persistent weakness.  Patient denies any symptoms.  Denies any pain.  According to EMS [facility] is concerned that he has been more weak and not eating.  Patient denies any nausea or vomiting.  They are also concerned that he is got blood in his diaper.  I called and spoke with the staff on call tonight who was worried about him having blood in his urine.  States that they are worried that they cannot take care of him because he is unable to walk.  Patient appears at baseline.  Have made attempts to reach patient's legal guardian and messages have been left without any return.    Past Medical History:  Diagnosis Date  . DVT (deep venous thrombosis) ([place])   . Insomnia   . Memory loss   . Neuropathy    Family History  Problem Relation Age of Onset  . Diabetes Neg Hx   . Hypertension Neg Hx   . Deep vein thrombosis Neg Hx    Past Surgical History:  Procedure Laterality Date  . pt does not remember surgery hx     Patient Active Problem List   Diagnosis Date Noted  . Syncope 12/31/2017  . Sepsis ([place]) 12/31/2017  . Leg swelling 11/11/2017  . Essential hypertension 11/11/2017  . Pure hypercholesterolemia 11/11/2017  . Confirmed venous thromboembolism (VTE) 06/27/2017  . DVT (deep venous thrombosis) ([place]) 08/31/2016      Prior to Admission medications   Medication Sig Start Date End Date Taking? Authorizing Provider  acetaminophen (TYLENOL) 500 MG tablet Take 500 mg by mouth every morning.     [provider]  acetaminophen (TYLENOL) 500 MG tablet Take 1,000 mg by mouth 3 (three) times daily  as needed for mild pain or moderate pain.    [provider]  amoxicillin-clavulanate (AUGMENTIN) 875-125 MG tablet Take 1 tablet by mouth every 12 (twelve) hours. Patient not taking: Reported on 03/02/2018 01/02/18   Demetrios Loll, MD  apixaban (ELIQUIS) 5 MG TABS tablet Take 5 mg by mouth daily.    [provider]  cephALEXin (KEFLEX) 500 MG capsule Take 1 capsule (500 mg total) by mouth 3 (three) times daily for 7 days. 08/23/18 08/30/18  Merlyn Lot, MD  cholecalciferol (VITAMIN D) 1000 units tablet Take 2,000 Units by mouth daily.     [provider]  docusate sodium (COLACE) 100 MG capsule Take 100 mg by mouth 2 (two) times daily as needed for mild constipation.    [provider]  furosemide (LASIX) 40 MG tablet Take 0.5 tablets (20 mg total) by mouth daily. 03/02/18 03/02/19  Merlyn Lot, MD  Melatonin 10 MG TABS Take 10 mg by mouth at bedtime.    [provider]  Multiple Vitamins-Minerals (THERAVIM-M) TABS Take 1 tablet by mouth daily.    [provider]  pantoprazole (PROTONIX) 40 MG tablet Take 1 tablet (40 mg total) by mouth daily. 01/02/18   Demetrios Loll, MD  polyethylene glycol [facility] / Floria Raveling) packet Take 17 g by mouth daily as needed for mild constipation. Patient taking differently: Take 17  g by mouth daily.  06/30/17   Vaughan Basta, MD  pravastatin (PRAVACHOL) 40 MG tablet Take 40 mg by mouth at bedtime.     [provider]  risperiDONE (RISPERDAL) 2 MG tablet Take 2 mg by mouth at bedtime.    [provider]  tiZANidine (ZANAFLEX) 4 MG capsule Take 4 mg by mouth 3 (three) times daily.    [provider]  vitamin B-12 (CYANOCOBALAMIN) 1000 MCG tablet Take 2,000 mcg by mouth daily.    [provider]  vitamin C (ASCORBIC ACID) 500 MG tablet Take 500 mg by mouth 2 (two) times daily.    [provider]  zinc sulfate 220 (50 Zn) MG capsule Take 220 mg by mouth daily.     [provider]    Allergies Patient has no known allergies.    Social History Social History   Tobacco Use  . Smoking status: Never Smoker  . Smokeless tobacco: Never Used  Substance Use Topics  . Alcohol use: No  . Drug use: No    Review of Systems Patient denies headaches, rhinorrhea, blurry vision, numbness, shortness of breath, chest pain, edema, cough, abdominal pain, nausea, vomiting, diarrhea, dysuria, fevers, rashes or hallucinations unless otherwise stated above in HPI. ____________________________________________   PHYSICAL EXAM:  VITAL SIGNS: Vitals:   08/23/18 1944 08/23/18 2326  BP: (!) 146/75 136/83  Pulse: 83 79  Resp: 16 18  Temp:    SpO2: 95% 96%    Constitutional: Alert , pleasant and nontoxic appearing Eyes: Conjunctivae are normal.  Head: Atraumatic. Nose: No congestion/rhinnorhea. Mouth/Throat: Mucous membranes are moist.   Neck: No stridor. Painless ROM.  Cardiovascular: Normal rate, regular rhythm. Grossly normal heart sounds.  Good peripheral circulation. Respiratory: Normal respiratory effort.  No retractions. Lungs CTAB. Gastrointestinal: Soft and nontender. No distention. No abdominal bruits. No CVA tenderness. Genitourinary: deferred Musculoskeletal: No lower extremity tenderness, trace BLE.  No joint effusions.  No back pain with percussion Neurologic:   No gross focal neurologic deficits are appreciated. No facial droop Skin:  Skin is warm, dry and intact. No rash noted. Psychiatric: appropriate ____________________________________________   LABS (all labs ordered are listed, but only abnormal results are displayed)  Results for orders placed or performed during the hospital encounter of 08/23/18 (from the past 24 hour(s))  CBC with Differential/Platelet     Status: Abnormal   Collection Time: 08/23/18  8:52 PM  Result Value Ref Range   WBC 12.0 (H) 4.0 - 10.5 K/uL   RBC 4.51 4.22 - 5.81 MIL/uL   Hemoglobin 13.8 13.0  - 17.0 g/dL   HCT 43.9 39.0 - 52.0 %   MCV 97.3 80.0 - 100.0 fL   MCH 30.6 26.0 - 34.0 pg   MCHC 31.4 30.0 - 36.0 g/dL   RDW 13.7 11.5 - 15.5 %   Platelets 208 150 - 400 K/uL   nRBC 0.0 0.0 - 0.2 %   Neutrophils Relative % 69 %   Neutro Abs 8.3 (H) 1.7 - 7.7 K/uL   Lymphocytes Relative 21 %   Lymphs Abs 2.5 0.7 - 4.0 K/uL   Monocytes Relative 9 %   Monocytes Absolute 1.1 (H) 0.1 - 1.0 K/uL   Eosinophils Relative 0 %   Eosinophils Absolute 0.0 0.0 - 0.5 K/uL   Basophils Relative 0 %   Basophils Absolute 0.0 0.0 - 0.1 K/uL   Immature Granulocytes 1 %   Abs Immature Granulocytes 0.07 0.00 - 0.07 K/uL  Basic metabolic panel  Status: Abnormal   Collection Time: 08/23/18  8:52 PM  Result Value Ref Range   Sodium 140 135 - 145 mmol/L   Potassium 3.4 (L) 3.5 - 5.1 mmol/L   Chloride 106 98 - 111 mmol/L   CO2 25 22 - 32 mmol/L   Glucose, Bld 152 (H) 70 - 99 mg/dL   BUN 17 8 - 23 mg/dL   Creatinine, Ser 0.94 0.61 - 1.24 mg/dL   Calcium 8.3 (L) 8.9 - 10.3 mg/dL   GFR calc non Af Amer >60 >60 mL/min   GFR calc Af Amer >60 >60 mL/min   Anion gap 9 5 - 15   ____________________________________________ ___________________________  RADIOLOGY  I personally reviewed all radiographic images ordered to evaluate for the above acute complaints and reviewed radiology reports and findings.  These findings were personally discussed with the patient.  Please see medical record for radiology report.  ____________________________________________   PROCEDURES  Procedure(s) performed:  Procedures    Critical Care performed: no ____________________________________________   INITIAL IMPRESSION / ASSESSMENT AND PLAN / ED COURSE  Pertinent labs & imaging results that were available during my care of the patient were reviewed by me and considered in my medical decision making (see chart for details).   DDX: cystitis, stone, mass, prostatitis  Asheton Scheffler is a 84 y.o. who presents to  the ED with symptoms as described above.  Patient denies any symptoms or any discomfort.  Did have red cells in his urinalysis done this morning.  No signs of sepsis.  Abdominal exam soft benign.  Caretaker at facility stated the patient was having some back pain therefore will order CT stone.  May have a component of cystitis but is otherwise well-appearing.  Clinical Course as of Aug 24 2335  Wed Aug 23, 2018  2005 Patient is tolerating oral hydration.  Tolerated oral antibiotics.  No evidence of stone via CT stone study.  Does not have any pain with palpation of the back probable remote compression fractures.  Remainder of exam is soft and benign.   [PR]  2121 Patient's blood work remained stable.  No fever.  Mild white count not significantly changed from previous.  He is tolerating oral antibiotics.  Urine will be sent off for culture.  This point do believe he stable and appropriate for discharge back to facility.  I made multiple attempts trying to page and reached the facility manager and left message.  Have also paged the patient's legal guardian a left message.   [PR]    Clinical Course User Index [PR] Merlyn Lot, MD     As part of my medical decision making, I reviewed the following data within the [facility] notes reviewed and incorporated, Labs reviewed, notes from prior ED visits.   ____________________________________________   FINAL CLINICAL IMPRESSION(S) / ED DIAGNOSES  Final diagnoses:  Cystitis  Weakness      NEW MEDICATIONS STARTED DURING THIS VISIT:  Discharge Medication List as of 08/23/2018 11:25 PM    START taking these medications   Details  cephALEXin (KEFLEX) 500 MG capsule Take 1 capsule (500 mg total) by mouth 3 (three) times daily for 7 days., Starting Wed 08/23/2018, Until Wed 08/30/2018, Print         Note:  This document was prepared using Dragon voice recognition software and may include unintentional dictation  errors.    Merlyn Lot, MD 08/23/18 [phone]

## 2018-08-23 NOTE — ED Triage Notes (Signed)
Pt arrived via ems from [facility] with increased weakness via staff. Pt was just discharged earlier this AM and has no complaints. Pt is pleasant and cooperative. Vitals WNL.

## 2018-08-23 NOTE — Discharge Instructions (Signed)
Follow up with PCP.  Return for worsening symptoms, fever, inability to tolerate oral medication.

## 2018-08-23 NOTE — ED Notes (Signed)
[facility] [employer] Lucienne Minks) contacted by Lorriane Shire, RN. Call back from [facility] pending.

## 2018-08-23 NOTE — ED Provider Notes (Signed)
Patient's repeat troponin is 0.06 and then 0.05.  I did leave a message with patient's legal guardian to discuss these results but did not hear back from her.  EKG without any concerning acute findings and given that the troponin has not significantly elevated and is in fact improved I doubt that this elevation represents ACS.  Again unclear etiology the patient's leaning to the right however patient does not appear to be in any distress and work-up otherwise unremarkable.  At this point I think it is reasonable for patient be discharged back to care home.  Do think it is important however the patient follow-up with primary care.     Nance Pear, MD 08/23/18 Laureen Abrahams

## 2018-08-23 NOTE — ED Notes (Signed)
Report given to Jeff for transport back to facility. Per [name], care will be provided for pt at home care service. Transport unable to be provided by facility. EMS called.   Charles Nelson [phone])

## 2018-08-25 LAB — URINE CULTURE: Culture: NO GROWTH

## 2018-08-29 ENCOUNTER — Emergency Department: Payer: Medicare Other

## 2018-08-29 ENCOUNTER — Emergency Department
Admission: EM | Admit: 2018-08-29 | Discharge: 2018-08-30 | Disposition: A | Discharge status: Home / Self Care | Payer: Medicare Other | Source: Home / Self Care | Attending: Emergency Medicine | Admitting: Emergency Medicine

## 2018-08-29 ENCOUNTER — Other Ambulatory Visit: Payer: Self-pay

## 2018-08-29 DIAGNOSIS — R600 Localized edema: Secondary | ICD-10-CM | POA: Insufficient documentation

## 2018-08-29 DIAGNOSIS — R5381 Other malaise: Secondary | ICD-10-CM | POA: Diagnosis not present

## 2018-08-29 DIAGNOSIS — I10 Essential (primary) hypertension: Secondary | ICD-10-CM

## 2018-08-29 DIAGNOSIS — R609 Edema, unspecified: Secondary | ICD-10-CM

## 2018-08-29 DIAGNOSIS — R413 Other amnesia: Secondary | ICD-10-CM

## 2018-08-29 DIAGNOSIS — R531 Weakness: Secondary | ICD-10-CM | POA: Insufficient documentation

## 2018-08-29 LAB — URINALYSIS, COMPLETE (UACMP) WITH MICROSCOPIC
BACTERIA UA: NONE SEEN
Bilirubin Urine: NEGATIVE
Glucose, UA: NEGATIVE mg/dL
Hgb urine dipstick: NEGATIVE
Ketones, ur: NEGATIVE mg/dL
LEUKOCYTES UA: NEGATIVE
Nitrite: NEGATIVE
Protein, ur: NEGATIVE mg/dL
RBC / HPF: >50 RBC/hpf — ABNORMAL HIGH (ref 0–5)
Specific Gravity, Urine: 1.025 (ref 1.005–1.030)
pH: 5 (ref 5.0–8.0)

## 2018-08-29 LAB — CBC WITH DIFFERENTIAL/PLATELET
Abs Immature Granulocytes: 0.09 10*3/uL — ABNORMAL HIGH (ref 0.00–0.07)
BASOS PCT: 0 %
Basophils Absolute: 0 10*3/uL (ref 0.0–0.1)
Eosinophils Absolute: 0 10*3/uL (ref 0.0–0.5)
Eosinophils Relative: 0 %
HCT: 41.9 % (ref 39.0–52.0)
Hemoglobin: 12.9 g/dL — ABNORMAL LOW (ref 13.0–17.0)
Immature Granulocytes: 1 %
Lymphocytes Relative: 12 %
Lymphs Abs: 1.3 10*3/uL (ref 0.7–4.0)
MCH: 30.1 pg (ref 26.0–34.0)
MCHC: 30.8 g/dL (ref 30.0–36.0)
MCV: 97.7 fL (ref 80.0–100.0)
MONOS PCT: 7 %
Monocytes Absolute: 0.8 10*3/uL (ref 0.1–1.0)
Neutro Abs: 8.4 10*3/uL — ABNORMAL HIGH (ref 1.7–7.7)
Neutrophils Relative %: 80 %
Platelets: 272 10*3/uL (ref 150–400)
RBC: 4.29 MIL/uL (ref 4.22–5.81)
RDW: 13.6 % (ref 11.5–15.5)
WBC: 10.6 10*3/uL — ABNORMAL HIGH (ref 4.0–10.5)
nRBC: 0 % (ref 0.0–0.2)

## 2018-08-29 LAB — COMPREHENSIVE METABOLIC PANEL
ALT: 25 U/L (ref 0–44)
AST: 37 U/L (ref 15–41)
Albumin: 3.5 g/dL (ref 3.5–5.0)
Alkaline Phosphatase: 125 U/L (ref 38–126)
Anion gap: 8 (ref 5–15)
BUN: 22 mg/dL (ref 8–23)
CHLORIDE: 105 mmol/L (ref 98–111)
CO2: 26 mmol/L (ref 22–32)
CREATININE: 1.11 mg/dL (ref 0.61–1.24)
Calcium: 8.7 mg/dL — ABNORMAL LOW (ref 8.9–10.3)
GFR calc Af Amer: >60 mL/min (ref >60)
GFR calc non Af Amer: 60 mL/min — ABNORMAL LOW (ref >60)
Glucose, Bld: 127 mg/dL — ABNORMAL HIGH (ref 70–99)
POTASSIUM: 3.2 mmol/L — AB (ref 3.5–5.1)
Sodium: 139 mmol/L (ref 135–145)
Total Bilirubin: 0.6 mg/dL (ref 0.3–1.2)
Total Protein: 6.5 g/dL (ref 6.5–8.1)

## 2018-08-29 LAB — TROPONIN I: Troponin I: 0.05 ng/mL (ref <0.03)

## 2018-08-29 NOTE — ED Provider Notes (Signed)
[facility] Emergency Department Provider Note       Time seen: ----------------------------------------- 8:04 PM on 08/29/2018 -----------------------------------------   I have reviewed the triage vital signs and the nursing notes.  HISTORY   Chief Complaint No chief complaint on file.    HPI Marsean Elkhatib is a 84 y.o. male with a history of DVT, insomnia, memory loss, neuropathy, syncope who presents to the ED for weakness.  Patient reportedly has difficulty walking.  He lives at a family care home, was seen here for progressive weakness over the past 2 weeks.  They felt like it got acutely worse but on further discussion this has been going on for several weeks.  He has been seen in the ER recently.  Currently he denies any complaints.  He has had peripheral edema.  Past Medical History:  Diagnosis Date  . DVT (deep venous thrombosis) ([place])   . Insomnia   . Memory loss   . Neuropathy     Patient Active Problem List   Diagnosis Date Noted  . Syncope 12/31/2017  . Sepsis ([place]) 12/31/2017  . Leg swelling 11/11/2017  . Essential hypertension 11/11/2017  . Pure hypercholesterolemia 11/11/2017  . Confirmed venous thromboembolism (VTE) 06/27/2017  . DVT (deep venous thrombosis) ([place]) 08/31/2016    Past Surgical History:  Procedure Laterality Date  . pt does not remember surgery hx      Allergies Patient has no known allergies.  Social History Social History   Tobacco Use  . Smoking status: Never Smoker  . Smokeless tobacco: Never Used  Substance Use Topics  . Alcohol use: No  . Drug use: No   Review of Systems Constitutional: Negative for fever. Cardiovascular: Negative for chest pain. Respiratory: Negative for shortness of breath. Gastrointestinal: Negative for abdominal pain, vomiting and diarrhea. Musculoskeletal: Positive for peripheral edema Skin: Negative for rash. Neurological: Positive for weakness  All systems  negative/normal/unremarkable except as stated in the HPI  ____________________________________________   PHYSICAL EXAM:  VITAL SIGNS: ED Triage Vitals  Enc Vitals Group     BP      Pulse      Resp      Temp      Temp src      SpO2      Weight      Height      Head Circumference      Peak Flow      Pain Score      Pain Loc      Pain Edu?      Excl. in [place]?    Constitutional: Alert, no acute distress.  Disheveled appearance. ENT   Head: Normocephalic and atraumatic.   Nose: No congestion/rhinnorhea.   Mouth/Throat: Mucous membranes are moist.   Neck: No stridor. Cardiovascular: Normal rate, regular rhythm. No murmurs, rubs, or gallops. Respiratory: Normal respiratory effort without tachypnea nor retractions. Breath sounds are clear and equal bilaterally. No wheezes/rales/rhonchi. Gastrointestinal: Soft and nontender. Normal bowel sounds Musculoskeletal: Bilateral pitting edema is noted Neurologic:  Normal speech and language. No gross focal neurologic deficits are appreciated.  Strength is diminished in the lower extremities. Skin:  Skin is warm, dry and intact. No rash noted. Psychiatric: Mood and affect are normal. Speech and behavior are normal.  ____________________________________________  ED COURSE:  As part of my medical decision making, I reviewed the following data within the [place] History obtained from family if available, nursing notes, old chart and ekg, as well as notes  from prior ED visits. Patient presented for weakness, we will assess with labs and imaging as indicated at this time.   Procedures ____________________________________________   LABS (pertinent positives/negatives)  Labs Reviewed  CBC WITH DIFFERENTIAL/PLATELET - Abnormal; Notable for the following components:      Result Value   WBC 10.6 (*)    Hemoglobin 12.9 (*)    Neutro Abs 8.4 (*)    Abs Immature Granulocytes 0.09 (*)    All other components  within normal limits  COMPREHENSIVE METABOLIC PANEL - Abnormal; Notable for the following components:   Potassium 3.2 (*)    Glucose, Bld 127 (*)    Calcium 8.7 (*)    GFR calc non Af Amer 60 (*)    All other components within normal limits  TROPONIN I - Abnormal; Notable for the following components:   Troponin I 0.05 (*)    All other components within normal limits  URINALYSIS, COMPLETE (UACMP) WITH MICROSCOPIC  CBG MONITORING, ED  ____________________________________________  DIFFERENTIAL DIAGNOSIS   General debility, dehydration, electrolyte abnormality, occult infection, arrhythmia, anemia  FINAL ASSESSMENT AND PLAN  Weakness, peripheral edema   Plan: The patient had presented for progressive weakness. Patient's labs are at his baseline.  This is his third visit in the past week for weakness in his needs seem to be beyond the capability of his current living environment.  We will consult social work and have physical therapy perform an evaluation.  He appears medically stable at this time.   Laurence Aly, MD   Note: This note was generated in part or whole with voice recognition software. Voice recognition is usually quite accurate but there are transcription errors that can and very often do occur. I apologize for any typographical errors that were not detected and corrected.     Earleen Newport, MD 08/29/18 2207

## 2018-08-29 NOTE — ED Notes (Signed)
Pt has small skin tear noted on left hand. EDT Zach applied gauze and paper tape.

## 2018-08-29 NOTE — ED Notes (Signed)
Pt checked and dry at this time. Pt repositioned and slide up in bed. Pt given warm blankets. Lights dimmed and pt resting comfortably at this time.

## 2018-08-29 NOTE — ED Notes (Signed)
Report given to Vanessa RN.

## 2018-08-29 NOTE — ED Notes (Signed)
Date and time results received: 08/29/18 0848  Test: Troponin Critical Value: 0.05  Name of Provider Notified: Dr. Jimmye Norman  Orders Received? Or Actions Taken?: Acknowledged, patient's baseline via previous labs.

## 2018-08-29 NOTE — ED Triage Notes (Signed)
Pt comes via [facility] from [facility]. Phyllis contact person [phone].  EMS reports that pt had sudden onset of immobility. Staff state that pt is being treated for cellulitis with keflex.  Pt is alert and oriented. Pt has 4+ pitting edema noted to bilateral lower extremities. Pt also has area on left side of face.

## 2018-08-30 LAB — BRAIN NATRIURETIC PEPTIDE: B Natriuretic Peptide: 144 pg/mL — ABNORMAL HIGH (ref 0.0–100.0)

## 2018-08-30 NOTE — ED Provider Notes (Signed)
Patient needs wheelchair because he is unable to ambulate. Needs wheelchair for ADLs   Lavonia Drafts, MD 08/30/18 1354

## 2018-08-30 NOTE — ED Notes (Signed)
Pt cleaned and changed by this tech and Lorrie,RN. Bed linen changed, blue pants placed on pt as well as yellow socks. Offered pt a warm blanket and pt declined at this time, given call light and pt instructed to use it for needs.

## 2018-08-30 NOTE — ED Provider Notes (Signed)
Patient seen by PT and SNF recommended but CM and SW report no way for this to be covered, will arrange home health for patient   Lavonia Drafts, MD 08/30/18 1458

## 2018-08-30 NOTE — ED Notes (Signed)
[facility]  CALLED  FOR  TRANSPORT 

## 2018-08-30 NOTE — ED Notes (Signed)
PT working with patient at this time.  

## 2018-08-30 NOTE — ED Notes (Signed)
Pt repositioned in bed at this time. Pt still states he does not want anything to eat at this time. Pt also offered fluids at this time but pt declined.

## 2018-08-30 NOTE — ED Notes (Signed)
Pt repositioned and brief changed from an episode of incontinents. Pt also given some water at this time.

## 2018-08-30 NOTE — ED Notes (Signed)
Pt attempting to climb out of bed unassisted. Pt redirected and placed back into bed. Pt unaware that he will fall if he attempts to stand alone at this time

## 2018-08-30 NOTE — ED Notes (Signed)
Report called and given to tammy at the facility to inform them of pt return. Pt will need to return to facility through EMS

## 2018-08-30 NOTE — Clinical Social Work Note (Signed)
CSW spoke to patient's caregiver Tammy [phone], and informed her that because patient has not had a qualifying stay in the hospital he is unable to use his Medicare benefits to go to SNF unless he is able to private pay.  CSW explained that if patient had a different insurance he could have possibly gone to SNF.  CSW was informed by patient's caregiver that she is concerned because patient has complained about chest pain that last 3 times he came to the ED.  Patient's caregiver feels there is something else going on with him.  CSW informed patient's caregiver that home health can be set up, to assist with home health PT and social work to try to work on [place] placement for patient from the family care home.  Patient's care giver express understanding.  CSW made referral to case manager to set up home health, CSW also contacted patient's legal guardian April Goggings [phone] to inform her about patient not able to go to SNF for short term rehab.  Evette Cristal, MSW, [facility] ED Covering CSW [phone] 08/30/2018 4:00 PM

## 2018-08-30 NOTE — ED Notes (Signed)
Took wet brief off of patient. Urinal placed. Caregiver at bedside. Will press call bell when ready for urinal to be emptied.

## 2018-08-30 NOTE — ED Notes (Signed)
Pt given breakfast meal tray at this time.  

## 2018-08-30 NOTE — Care Management Note (Signed)
Case Management Note  Patient Details  Name: Nery Frappier MRN: [id number] Date of Birth: 1930-06-05  Subjective/Objective:   Patient is being seen in the ED for sudden immobility/ progressive immobility.  Patient is from a Group care home and will return there at discharge.  PT recommends SNF but there is no payer for SNF- pt does meet the Medicare Criteria for 3 night medically necessary stay.  Home health arranged with [facility] and wheelchair also ordered to be delivered to care home also from [facility].  Melene Muller will set up services and equipment.  Patient will transport back to care home via EMS.  Tammy at care home notified of plan of care and verbalizes understanding.  Legal guardian April contacted and informed of plan also, offered choice of [place] and she chooses [place].                    Action/Plan: Discharge to care home with home health.   Expected Discharge Date:                  Expected Discharge Plan:  [place]  In-House Referral:  Clinical Social Work  Discharge planning Services  CM Consult  Post Acute Care Choice:  Durable Medical Equipment, Home Health Choice offered to:  [facility] POA / Guardian  DME Arranged:  Programmer, multimedia DME Agency:  [place]:  RN, PT, OT, Nurse's Aide, Social Work [employer] Agency:  [place]  Status of Service:  Completed, signed off  If discussed at [employer], dates discussed:    Additional Comments:  Shelbie Hutching, RN 08/30/2018, 1:57 PM

## 2018-08-30 NOTE — ED Notes (Signed)
Pt taken back to care facility via [facility]. VSS. NAD. Pt denies any concerns or complaints.

## 2018-08-30 NOTE — Evaluation (Signed)
Physical Therapy Evaluation Patient Details Name: Charles Nelson MRN: [id number] DOB: [birth year] Today's Date: 08/30/2018   History of Present Illness  Pt is a 85 y/o M who presented from family care home who has demonstrated progressive weakness over the past several weeks with difficulty walking.  Pt's PMH includes memory loss, neuropathy, syncope, DVT.     Clinical Impression  Pt admitted with above diagnosis. Pt currently with functional limitations due to the deficits listed below (see PT Problem List). [name] was pleasantly confused and has a h/o memory loss.  He was unable to provide information regarding his home layout at his family home or the amount of assist he required.  He currently requires mod assist for bed mobility and max assist to stand from bed.  Pt unable to ambulate at this time due to weakness.  Given pt's current mobility status, recommending SNF at d/c. Pt will benefit from skilled PT to increase their independence and safety with mobility to allow discharge to the venue listed below.      Follow Up Recommendations SNF    Equipment Recommendations  Other (comment)(TBD at next venue of care)    Recommendations for Other Services       Precautions / Restrictions Precautions Precautions: Fall Restrictions Weight Bearing Restrictions: No      Mobility  Bed Mobility Overal bed mobility: Needs Assistance Bed Mobility: Supine to Sit;Sit to Supine     Supine to sit: Mod assist;HOB elevated Sit to supine: Mod assist   General bed mobility comments: Assist to elevate trunk and bring LEs to EOB for supine>sit.  Pt requires assist for all aspects of bed mobility to return to supine.   Transfers Overall transfer level: Needs assistance Equipment used: Rolling walker (2 wheeled) Transfers: Sit to/from Stand Sit to Stand: Mod assist         General transfer comment: Heavy mod assist to boost to standing, although pt standing with flexed posture and  leaning against bed for support.    Ambulation/Gait Ambulation/Gait assistance: Max assist Gait Distance (Feet): 1 Feet Assistive device: Rolling walker (2 wheeled)       General Gait Details: Pt able to take one step with one step with RLE toward [facility] but unable to take step with LLE due to weakness.  Pt requires max assist to remain steady throughout and additionally leans posterior LEs on side of bed for support.   Stairs            Wheelchair Mobility    Modified Rankin (Stroke Patients Only)       Balance Overall balance assessment: Needs assistance Sitting-balance support: Single extremity supported;Feet supported Sitting balance-Leahy Scale: Poor Sitting balance - Comments: Pt relies on at least 1UE support sitting EOB   Standing balance support: Bilateral upper extremity supported;During functional activity Standing balance-Leahy Scale: Poor Standing balance comment: Pt relies on BUE support and outside physical assist to maintain standing                             Pertinent Vitals/Pain Pain Assessment: No/denies pain(no signs of pain)    Home Living Family/patient expects to be discharged to:: Group home                 Additional Comments: Pt is a poor historian and is unable to provide information regarding home layout or PLOF.  Per chart review he is from a family home.  Unsure of amount  of assist he was receiving.     Prior Function           Comments: Pt is a poor historian and is unable to provide information regarding home layout or PLOF.  Per chart review he is from a family home.  Unsure of amount of assist he was receiving.      Hand Dominance   Dominant Hand: Right    Extremity/Trunk Assessment   Upper Extremity Assessment Upper Extremity Assessment: LUE deficits/detail;RUE deficits/detail RUE Deficits / Details: Strength grossly 3/5 LUE Deficits / Details: Strength grossly 2/5, pt with limited L shoulder AROM, pt  unable to provide reasoning as to why    Lower Extremity Assessment Lower Extremity Assessment: RLE deficits/detail;LLE deficits/detail RLE Deficits / Details: Strength grossly 3/5 LLE Deficits / Details: Strength grossly 2/5, unable to perform SLR due to weakness       Communication   Communication: No difficulties  Cognition Arousal/Alertness: Awake/alert Behavior During Therapy: Flat affect Overall Cognitive Status: History of cognitive impairments - at baseline                                        General Comments      Exercises     Assessment/Plan    PT Assessment Patient needs continued PT services  PT Problem List Decreased strength;Decreased range of motion;Decreased activity tolerance;Decreased balance;Decreased mobility;Decreased cognition;Decreased knowledge of use of DME;Decreased safety awareness       PT Treatment Interventions DME instruction;Gait training;Functional mobility training;Therapeutic activities;Therapeutic exercise;Balance training;Neuromuscular re-education;Cognitive remediation;Patient/family education;Wheelchair mobility training    PT Goals (Current goals can be found in the Care Plan section)  Acute Rehab PT Goals Patient Stated Goal: pt unable to state due to confusion; pt not oriented to self, place, time, or situation PT Goal Formulation: Patient unable to participate in goal setting Time For Goal Achievement: 09/13/18 Potential to Achieve Goals: Fair    Frequency Min 2X/week   Barriers to discharge Other (comment) Unsure of amount of assist available at family home    Co-evaluation               AM-PAC PT "6 Clicks" Mobility  Outcome Measure Help needed turning from your back to your side while in a flat bed without using bedrails?: A Lot Help needed moving from lying on your back to sitting on the side of a flat bed without using bedrails?: A Lot Help needed moving to and from a bed to a chair (including  a wheelchair)?: Total Help needed standing up from a chair using your arms (e.g., wheelchair or bedside chair)?: Total Help needed to walk in hospital room?: Total Help needed climbing 3-5 steps with a railing? : Total 6 Click Score: 8    End of Session Equipment Utilized During Treatment: Gait belt Activity Tolerance: Patient limited by fatigue Patient left: in bed;with call bell/phone within reach Nurse Communication: Mobility status PT Visit Diagnosis: Muscle weakness (generalized) (M62.81);Unsteadiness on feet (R26.81);Other abnormalities of gait and mobility (R26.89);Difficulty in walking, not elsewhere classified (R26.2)    Time: 1103-1594 PT Time Calculation (min) (ACUTE ONLY): 20 min   Charges:   PT Evaluation $PT Eval Moderate Complexity: 1 Mod PT Treatments $Therapeutic Activity: 8-22 mins       Collie Siad PT, DPT 08/30/2018, 10:57 AM

## 2018-08-30 NOTE — ED Notes (Signed)
Care handoff received from [place], [place] at this time. PT awaits EMS transport at this time.

## 2018-08-30 NOTE — ED Notes (Signed)
Tammy from [facility] care home came for an update at this time.  Contact #[phone].  Facility # [phone].  Apparently, the phone at the facility is not working at this time.  Tammy states staff can call her # when patient is ready to be discharged.

## 2018-08-31 ENCOUNTER — Inpatient Hospital Stay
Admission: EM | Admit: 2018-08-31 | Discharge: 2018-09-04 | DRG: 948 | Disposition: A | Discharge status: Skilled Nursing Facility | Payer: Medicare Other | Attending: Internal Medicine | Admitting: Internal Medicine

## 2018-08-31 ENCOUNTER — Other Ambulatory Visit: Payer: Self-pay

## 2018-08-31 DIAGNOSIS — Z66 Do not resuscitate: Secondary | ICD-10-CM | POA: Diagnosis present

## 2018-08-31 DIAGNOSIS — Z9181 History of falling: Secondary | ICD-10-CM

## 2018-08-31 DIAGNOSIS — W19XXXS Unspecified fall, sequela: Secondary | ICD-10-CM | POA: Diagnosis not present

## 2018-08-31 DIAGNOSIS — Z993 Dependence on wheelchair: Secondary | ICD-10-CM

## 2018-08-31 DIAGNOSIS — E7800 Pure hypercholesterolemia, unspecified: Secondary | ICD-10-CM | POA: Diagnosis present

## 2018-08-31 DIAGNOSIS — R296 Repeated falls: Secondary | ICD-10-CM | POA: Diagnosis present

## 2018-08-31 DIAGNOSIS — E876 Hypokalemia: Secondary | ICD-10-CM | POA: Diagnosis not present

## 2018-08-31 DIAGNOSIS — I959 Hypotension, unspecified: Secondary | ICD-10-CM

## 2018-08-31 DIAGNOSIS — W19XXXA Unspecified fall, initial encounter: Secondary | ICD-10-CM

## 2018-08-31 DIAGNOSIS — I491 Atrial premature depolarization: Secondary | ICD-10-CM

## 2018-08-31 DIAGNOSIS — Z7901 Long term (current) use of anticoagulants: Secondary | ICD-10-CM

## 2018-08-31 DIAGNOSIS — I498 Other specified cardiac arrhythmias: Secondary | ICD-10-CM | POA: Diagnosis present

## 2018-08-31 DIAGNOSIS — M7989 Other specified soft tissue disorders: Secondary | ICD-10-CM | POA: Diagnosis present

## 2018-08-31 DIAGNOSIS — C44319 Basal cell carcinoma of skin of other parts of face: Secondary | ICD-10-CM | POA: Diagnosis present

## 2018-08-31 DIAGNOSIS — F0390 Unspecified dementia without behavioral disturbance: Secondary | ICD-10-CM | POA: Diagnosis present

## 2018-08-31 DIAGNOSIS — R5381 Other malaise: Principal | ICD-10-CM | POA: Diagnosis present

## 2018-08-31 DIAGNOSIS — I10 Essential (primary) hypertension: Secondary | ICD-10-CM | POA: Diagnosis present

## 2018-08-31 DIAGNOSIS — R531 Weakness: Secondary | ICD-10-CM | POA: Diagnosis not present

## 2018-08-31 DIAGNOSIS — D72829 Elevated white blood cell count, unspecified: Secondary | ICD-10-CM | POA: Diagnosis present

## 2018-08-31 DIAGNOSIS — E785 Hyperlipidemia, unspecified: Secondary | ICD-10-CM | POA: Diagnosis present

## 2018-08-31 DIAGNOSIS — R001 Bradycardia, unspecified: Secondary | ICD-10-CM

## 2018-08-31 DIAGNOSIS — Z86711 Personal history of pulmonary embolism: Secondary | ICD-10-CM

## 2018-08-31 DIAGNOSIS — G629 Polyneuropathy, unspecified: Secondary | ICD-10-CM | POA: Diagnosis present

## 2018-08-31 DIAGNOSIS — R195 Other fecal abnormalities: Secondary | ICD-10-CM

## 2018-08-31 DIAGNOSIS — I9589 Other hypotension: Secondary | ICD-10-CM

## 2018-08-31 DIAGNOSIS — Z79899 Other long term (current) drug therapy: Secondary | ICD-10-CM

## 2018-08-31 DIAGNOSIS — Z86718 Personal history of other venous thrombosis and embolism: Secondary | ICD-10-CM

## 2018-08-31 DIAGNOSIS — Z8673 Personal history of transient ischemic attack (TIA), and cerebral infarction without residual deficits: Secondary | ICD-10-CM

## 2018-08-31 LAB — CBC
HCT: 43.3 % (ref 39.0–52.0)
Hemoglobin: 13.5 g/dL (ref 13.0–17.0)
MCH: 30.5 pg (ref 26.0–34.0)
MCHC: 31.2 g/dL (ref 30.0–36.0)
MCV: 97.7 fL (ref 80.0–100.0)
Platelets: 244 10*3/uL (ref 150–400)
RBC: 4.43 MIL/uL (ref 4.22–5.81)
RDW: 13.5 % (ref 11.5–15.5)
WBC: 10.8 10*3/uL — ABNORMAL HIGH (ref 4.0–10.5)
nRBC: 0.2 % (ref 0.0–0.2)

## 2018-08-31 LAB — BASIC METABOLIC PANEL
Anion gap: 9 (ref 5–15)
BUN: 18 mg/dL (ref 8–23)
CO2: 26 mmol/L (ref 22–32)
Calcium: 8.6 mg/dL — ABNORMAL LOW (ref 8.9–10.3)
Chloride: 105 mmol/L (ref 98–111)
Creatinine, Ser: 0.84 mg/dL (ref 0.61–1.24)
GFR calc Af Amer: >60 mL/min (ref >60)
GFR calc non Af Amer: >60 mL/min (ref >60)
Glucose, Bld: 170 mg/dL — ABNORMAL HIGH (ref 70–99)
Potassium: 3.3 mmol/L — ABNORMAL LOW (ref 3.5–5.1)
Sodium: 140 mmol/L (ref 135–145)

## 2018-08-31 LAB — MAGNESIUM: Magnesium: 2.4 mg/dL (ref 1.7–2.4)

## 2018-08-31 MED ORDER — VITAMIN D3 25 MCG (1000 UNIT) PO TABS
2000.0000 [IU] | ORAL_TABLET | Freq: Every day | ORAL | Status: DC
  Administered 2018-08-31 – 2018-09-04 (×5): 2000 [IU] via ORAL
  Filled 2018-08-31 (×7): qty 2

## 2018-08-31 MED ORDER — APIXABAN 5 MG PO TABS
5.0000 mg | ORAL_TABLET | Freq: Two times a day (BID) | ORAL | Status: DC
  Administered 2018-08-31 – 2018-09-04 (×8): 5 mg via ORAL
  Filled 2018-08-31 (×9): qty 1

## 2018-08-31 MED ORDER — VITAMIN B-12 1000 MCG PO TABS
2000.0000 ug | ORAL_TABLET | Freq: Every day | ORAL | Status: DC
  Administered 2018-09-01 – 2018-09-04 (×4): 2000 ug via ORAL
  Filled 2018-08-31 (×5): qty 2

## 2018-08-31 MED ORDER — ONDANSETRON HCL 4 MG/2ML IJ SOLN: 4.0000 mg | Freq: Four times a day (QID) | INTRAMUSCULAR | Status: DC | PRN

## 2018-08-31 MED ORDER — VITAMIN C 500 MG PO TABS
500.0000 mg | ORAL_TABLET | Freq: Two times a day (BID) | ORAL | Status: DC
  Administered 2018-08-31 – 2018-09-04 (×8): 500 mg via ORAL
  Filled 2018-08-31 (×8): qty 1

## 2018-08-31 MED ORDER — SODIUM CHLORIDE 0.9 % IV SOLN
INTRAVENOUS | Status: DC
  Administered 2018-08-31: 19:00:00 via INTRAVENOUS

## 2018-08-31 MED ORDER — RISPERIDONE 1 MG PO TABS
2.0000 mg | ORAL_TABLET | Freq: Every day | ORAL | Status: DC
  Administered 2018-08-31 – 2018-09-03 (×4): 2 mg via ORAL
  Filled 2018-08-31 (×5): qty 2

## 2018-08-31 MED ORDER — ZINC SULFATE 220 (50 ZN) MG PO CAPS
220.0000 mg | ORAL_CAPSULE | Freq: Every day | ORAL | Status: DC
  Administered 2018-09-01 – 2018-09-04 (×4): 220 mg via ORAL
  Filled 2018-08-31 (×5): qty 1

## 2018-08-31 MED ORDER — POLYETHYLENE GLYCOL 3350 17 G PO PACK
17.0000 g | PACK | Freq: Every day | ORAL | Status: DC
  Administered 2018-09-01 – 2018-09-04 (×4): 17 g via ORAL
  Filled 2018-08-31 (×5): qty 1

## 2018-08-31 MED ORDER — PANTOPRAZOLE SODIUM 40 MG PO TBEC
40.0000 mg | DELAYED_RELEASE_TABLET | Freq: Every day | ORAL | Status: DC
  Administered 2018-09-01 – 2018-09-04 (×4): 40 mg via ORAL
  Filled 2018-08-31 (×4): qty 1

## 2018-08-31 MED ORDER — ACETAMINOPHEN 650 MG RE SUPP: 650.0000 mg | Freq: Four times a day (QID) | RECTAL | Status: DC | PRN

## 2018-08-31 MED ORDER — PRAVASTATIN SODIUM 40 MG PO TABS
40.0000 mg | ORAL_TABLET | Freq: Every day | ORAL | Status: DC
  Administered 2018-08-31 – 2018-09-03 (×4): 40 mg via ORAL
  Filled 2018-08-31 (×4): qty 1

## 2018-08-31 MED ORDER — DOCUSATE SODIUM 100 MG PO CAPS
100.0000 mg | ORAL_CAPSULE | Freq: Two times a day (BID) | ORAL | Status: DC | PRN
  Administered 2018-09-04: 100 mg via ORAL
  Filled 2018-08-31: qty 1

## 2018-08-31 MED ORDER — POTASSIUM CHLORIDE 20 MEQ/15ML (10%) PO SOLN
40.0000 meq | Freq: Once | ORAL | Status: AC
  Administered 2018-08-31: 40 meq via ORAL
  Filled 2018-08-31: qty 30

## 2018-08-31 MED ORDER — ONDANSETRON HCL 4 MG PO TABS: 4.0000 mg | ORAL_TABLET | Freq: Four times a day (QID) | ORAL | Status: DC | PRN

## 2018-08-31 MED ORDER — TIZANIDINE HCL 4 MG PO TABS
4.0000 mg | ORAL_TABLET | Freq: Three times a day (TID) | ORAL | Status: DC
  Administered 2018-08-31 – 2018-09-04 (×11): 4 mg via ORAL
  Filled 2018-08-31 (×15): qty 1

## 2018-08-31 MED ORDER — ACETAMINOPHEN 325 MG PO TABS: 650.0000 mg | ORAL_TABLET | Freq: Four times a day (QID) | ORAL | Status: DC | PRN

## 2018-08-31 MED ORDER — ADULT MULTIVITAMIN W/MINERALS CH
1.0000 | ORAL_TABLET | Freq: Every day | ORAL | Status: DC
  Administered 2018-09-01 – 2018-09-04 (×4): 1 via ORAL
  Filled 2018-08-31 (×4): qty 1

## 2018-08-31 MED ORDER — MELATONIN 5 MG PO TABS
10.0000 mg | ORAL_TABLET | Freq: Every day | ORAL | Status: DC
  Administered 2018-08-31 – 2018-09-03 (×4): 10 mg via ORAL
  Filled 2018-08-31 (×5): qty 2

## 2018-08-31 NOTE — ED Notes (Addendum)
Fecal occult negative. Hemorrhoid noted.

## 2018-08-31 NOTE — ED Notes (Signed)
Pt checked and dry at this time. 

## 2018-08-31 NOTE — ED Triage Notes (Signed)
Pt arrives to ED via [facility] from [facility] [address] Lequita Halt yesterday for weakness and falls. EMS reports today states "loose stools". Tech at care home told EMS that stool had bright red blood this AM. EMS did not see stool. Pt has hx of dementia. Denies pain.

## 2018-08-31 NOTE — ED Notes (Signed)
When this RN called the number facility gave for "April the legal guardian"  At [phone] there was no answer and the voicemail stated that number was for "Amy Foust".

## 2018-08-31 NOTE — H&P (Signed)
[facility] [place] NAME: Charles Nelson    MR#:  [id number]  DATE OF BIRTH:  [birth year]  DATE OF ADMISSION:  08/31/2018  PRIMARY CARE PHYSICIAN: Lorelee Market, MD   REQUESTING/REFERRING PHYSICIAN:   CHIEF COMPLAINT:   Chief Complaint  Patient presents with  . Weakness  . Diarrhea    HISTORY OF PRESENT ILLNESS: Charles Nelson  is a 85 y.o. male with a known history of DVT in the past, dementia, neuropathy presented to the emergency room for generalized weakness.  Patient had 1/5 visit to the ER today in the last 1 week.  We could not get much information from the caregiver as the person was not available.  Patient is hard of hearing and also has dementia not a great historian.  Was evaluated in the emergency room blood pressure was on the low side.  And also patient had some normal EKG.  PAST MEDICAL HISTORY:   Past Medical History:  Diagnosis Date  . DVT (deep venous thrombosis) ([place])   . Insomnia   . Memory loss   . Neuropathy     PAST SURGICAL HISTORY:  Past Surgical History:  Procedure Laterality Date  . pt does not remember surgery hx      SOCIAL HISTORY:  Social History   Tobacco Use  . Smoking status: Never Smoker  . Smokeless tobacco: Never Used  Substance Use Topics  . Alcohol use: No    FAMILY HISTORY:  Family History  Problem Relation Age of Onset  . Diabetes Neg Hx   . Hypertension Neg Hx   . Deep vein thrombosis Neg Hx     DRUG ALLERGIES: No Known Allergies  REVIEW OF SYSTEMS:  Could not be obtained secondary to who cognitive impairment MEDICATIONS AT HOME:  Prior to Admission medications   Medication Sig Start Date End Date Taking? Authorizing Provider  acetaminophen (TYLENOL) 500 MG tablet Take 1,000 mg by mouth 3 (three) times daily as needed for mild pain or moderate pain.   Yes [provider]  apixaban (ELIQUIS) 5 MG TABS tablet Take 5 mg by mouth daily.   Yes [provider]  cholecalciferol (VITAMIN D) 1000 units tablet Take 2,000 Units by mouth daily.    Yes [provider]  furosemide (LASIX) 40 MG tablet Take 0.5 tablets (20 mg total) by mouth daily. 03/02/18 03/02/19 Yes Merlyn Lot, MD  Multiple Vitamins-Minerals (THERAVIM-M) TABS Take 1 tablet by mouth daily.   Yes [provider]  pantoprazole (PROTONIX) 40 MG tablet Take 1 tablet (40 mg total) by mouth daily. 01/02/18  Yes Demetrios Loll, MD  polyethylene glycol [facility] / Floria Raveling) packet Take 17 g by mouth daily as needed for mild constipation. Patient taking differently: Take 17 g by mouth daily.  06/30/17  Yes Vaughan Basta, MD  pravastatin (PRAVACHOL) 40 MG tablet Take 40 mg by mouth at bedtime.    Yes [provider]  risperiDONE (RISPERDAL) 2 MG tablet Take 2 mg by mouth at bedtime.   Yes [provider]  tiZANidine (ZANAFLEX) 4 MG capsule Take 4 mg by mouth 3 (three) times daily.   Yes [provider]  vitamin B-12 (CYANOCOBALAMIN) 1000 MCG tablet Take 2,000 mcg by mouth daily.   Yes [provider]  vitamin C (ASCORBIC ACID) 500 MG tablet Take 500 mg by mouth 2 (two) times daily.   Yes [provider]  zinc sulfate 220 (50 Zn) MG capsule Take  220 mg by mouth daily.   Yes [provider]  amoxicillin-clavulanate (AUGMENTIN) 875-125 MG tablet Take 1 tablet by mouth every 12 (twelve) hours. Patient not taking: Reported on 03/02/2018 01/02/18   Demetrios Loll, MD  docusate sodium (COLACE) 100 MG capsule Take 100 mg by mouth 2 (two) times daily as needed for mild constipation.    [provider]  Melatonin 10 MG TABS Take 10 mg by mouth at bedtime.    [provider]      PHYSICAL EXAMINATION:   VITAL SIGNS: Blood pressure 123/69, pulse (!) 51, temperature 98 F (36.7 C), temperature source Oral, resp. rate 15, height 6' (1.829 m), weight 77.9 kg, SpO2 96 %.  GENERAL:  85 y.o.-year-old patient lying  in the bed with no acute distress.  EYES: Pupils equal, round, reactive to light and accommodation. No scleral icterus. Extraocular muscles intact.  HEENT: Head atraumatic, normocephalic. Oropharynx and nasopharynx clear.  NECK:  Supple, no jugular venous distention. No thyroid enlargement, no tenderness.  LUNGS: Normal breath sounds bilaterally, no wheezing, rales,rhonchi or crepitation. No use of accessory muscles of respiration.  CARDIOVASCULAR: S1, S2 normal. No murmurs, rubs, or gallops.  ABDOMEN: Soft, nontender, nondistended. Bowel sounds present. No organomegaly or mass.  EXTREMITIES: No pedal edema, cyanosis, or clubbing.  NEUROLOGIC: Cranial nerves II through XII are intact. Muscle strength 5/5 in all extremities. Sensation intact. Gait not checked.  PSYCHIATRIC: The patient is alert and oriented x 2.  SKIN: No obvious rash, lesion, or ulcer.   LABORATORY PANEL:   CBC Recent Labs  Lab 08/29/18 2013 08/31/18 1042  WBC 10.6* 10.8*  HGB 12.9* 13.5  HCT 41.9 43.3  PLT 272 244  MCV 97.7 97.7  MCH 30.1 30.5  MCHC 30.8 31.2  RDW 13.6 13.5  LYMPHSABS 1.3  --   MONOABS 0.8  --   EOSABS 0.0  --   BASOSABS 0.0  --    ------------------------------------------------------------------------------------------------------------------  Chemistries  Recent Labs  Lab 08/29/18 2013 08/31/18 1042  NA 139 140  K 3.2* 3.3*  CL 105 105  CO2 26 26  GLUCOSE 127* 170*  BUN 22 18  CREATININE 1.11 0.84  CALCIUM 8.7* 8.6*  MG  --  2.4  AST 37  --   ALT 25  --   ALKPHOS 125  --   BILITOT 0.6  --    ------------------------------------------------------------------------------------------------------------------ estimated creatinine clearance is 69.3 mL/min (by C-G formula based on SCr of 0.84 mg/dL). ------------------------------------------------------------------------------------------------------------------ No results for input(s): TSH, T4TOTAL, T3FREE, THYROIDAB in the last  72 hours.  Invalid input(s): FREET3   Coagulation profile No results for input(s): INR, PROTIME in the last 168 hours. ------------------------------------------------------------------------------------------------------------------- No results for input(s): DDIMER in the last 72 hours. -------------------------------------------------------------------------------------------------------------------  Cardiac Enzymes Recent Labs  Lab 08/29/18 2013  TROPONINI 0.05*   ------------------------------------------------------------------------------------------------------------------ Invalid input(s): POCBNP  ---------------------------------------------------------------------------------------------------------------  Urinalysis    Component Value Date/Time   COLORURINE YELLOW (A) 08/29/2018 2250   APPEARANCEUR HAZY (A) 08/29/2018 2250   LABSPEC 1.025 08/29/2018 2250   PHURINE 5.0 08/29/2018 2250   GLUCOSEU NEGATIVE 08/29/2018 2250   HGBUR NEGATIVE 08/29/2018 2250   BILIRUBINUR NEGATIVE 08/29/2018 2250   KETONESUR NEGATIVE 08/29/2018 2250   PROTEINUR NEGATIVE 08/29/2018 2250   NITRITE NEGATIVE 08/29/2018 2250   LEUKOCYTESUR NEGATIVE 08/29/2018 2250     RADIOLOGY: Dg Chest Port 1 View  Result Date: 08/29/2018 CLINICAL DATA:  Acute onset of immobility. Bilateral lower extremity edema. EXAM: PORTABLE CHEST 1 VIEW COMPARISON:  Chest  radiograph performed 08/22/2018 FINDINGS: The lungs are hypoexpanded. Mild bibasilar atelectasis is noted. There is no evidence of pleural effusion or pneumothorax. The cardiomediastinal silhouette is borderline enlarged. No acute osseous abnormalities are seen. The patient's right shoulder arthroplasty is grossly unremarkable in appearance, though incompletely imaged. IMPRESSION: Lungs hypoexpanded, with mild bibasilar atelectasis. Borderline cardiomegaly. Electronically Signed   By: Garald Balding M.D.   On: 08/29/2018 23:28    EKG: Orders  placed or performed during the hospital encounter of 08/31/18  . ED EKG  . ED EKG  . EKG 12-Lead  . EKG 12-Lead    IMPRESSION AND PLAN:  85 year old elderly male patient with history of dementia, DVT on Eliquis, neuropathy, insomnia presented to emergency room for weakness  -Atrial arrhythmia Do not have information with the patient has history of atrial fibrillation in the past Cardiology consult Cycle troponin Telemetry monitoring Admit under observation bed Epic haiku message sent to [facility] health cardiology  -Hypokalemia acute Replace potassium orally  -Dementia supportive care  -Hypotension Hold diuretics IV fluids  -DVT prophylaxis Resume Eliquis for anticoagulation  All the records are reviewed and case discussed with ED provider. Management plans discussed with the patient, family and they are in agreement.  CODE STATUS:DNR Code Status History    Date Active Date Inactive Code Status Order ID Comments User Context   12/31/2017 1038 01/03/2018 1709 DNR [id number]  Demetrios Loll, MD Inpatient   06/27/2017 2132 06/30/2017 2147 Full Code [id number]  Hillary Bow, MD ED   08/31/2016 1902 09/03/2016 1701 Full Code [id number]  Henreitta Leber, MD Inpatient    Questions for Most Recent Historical Code Status (Order [id number])    Question Answer Comment   In the event of cardiac or respiratory ARREST Do not call a "code blue"    In the event of cardiac or respiratory ARREST Do not perform Intubation, CPR, defibrillation or ACLS    In the event of cardiac or respiratory ARREST Use medication by any route, position, wound care, and other measures to relive pain and suffering. May use oxygen, suction and manual treatment of airway obstruction as needed for comfort.        TOTAL TIME TAKING CARE OF THIS PATIENT: 54 minutes.    Saundra Shelling M.D on 08/31/2018 at 1:12 PM  Between 7am to 6pm - Pager - [phone]  After 6pm go to [email or web address] - password [email or web address] [facility]  [place]  [facility]  Office  [phone]  CC: Primary care physician; Lorelee Market, MD

## 2018-08-31 NOTE — ED Notes (Signed)
Called Pharmacy and spoke with Juanda Crumble who will verify medications and send up

## 2018-08-31 NOTE — ED Notes (Signed)
Called Silva Bandy [phone] (number from previous note) at [facility] who stated that April is legal guardian at [phone].

## 2018-08-31 NOTE — ED Notes (Signed)
Report given to Cassell Clement (Floor RN sent to ED to cover hold patients. Pt taken to CPOD.

## 2018-08-31 NOTE — ED Notes (Signed)
MD at bedside. 

## 2018-08-31 NOTE — ED Notes (Signed)
Pt checked and cleaned up at this time

## 2018-08-31 NOTE — Consult Note (Signed)
Cardiology Consultation:   Patient ID: Charles Nelson MRN: [id number]; DOB: 01-20-1930  Admit date: 08/31/2018 Date of Consult: 08/31/2018  Primary Care Provider: Lorelee Market, MD Primary Cardiologist: Dr. Saunders Revel Primary Electrophysiologist:  None    Patient Profile:   Charles Nelson is a 85 y.o. 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 Estanislado Pandy.  History of Present Illness:   Charles Nelson is an 85 yo male with PMH as above and most recently seen by [facility] on 11/09/2017 for bilateral leg swelling after a recent ED visit by Dr. Linton Ham.  08/2016 TTE with normal LVH, normal wall motion, EF 60 to 65%, trivial atrial stenosis.  On 08/31/16, he reportedly presented to [facility] ED and was admitted for bilateral pulmonary emboli and bilateral lower extremity DVT after recently stopping Coumadin. It was thought this was likely d/t poor mobility. On 06/2017, he was found to have occlusive thrombus involving the LIJV and proximal left subclavian vein that occurred in spite of being on Eliquis and was thought to be due to recent trauma and immobility d/t arm pain. He presented to [facility] ED 09/28/2017 with increased leg swelling, which was reported as an intermittent problem for several years and following bilateral DVTs while he was living in [place]. He was then treated with enoxaparin and warfarin then transitioned to apixaban. He reportedly does not follow a low sodium diet and has not regularly been wearing compression stockings. Over the last year, he has experienced low BP and syncopal episodes.  On 08/23/2018, patient arrived via EMS from [facility] with increased weakness per staff report. He was reportedly discharged  earlier that same AM with no complaints, and on reexamination, he was found to have cystitis and discharged with keflex. Before discharge, it was reported that the patient also had a broken toe, per documentation. On 08/29/2018, the patient presented to the ED again with reports of difficult ambulation and progressive weakness x2-4 weeks and lower extremity edema. It was found the patient was unable to ambulate and needs a wheelchair.   On 08/31/2018, the patient again presented to [facility] ED with complaint of ED and ongoing weakness. Per aid report, it was also thought that he may have had bloody stool but FOBT negative but hemorrhoid was noted on further evaluation. AMS was also noted but patient does have baseline dementia and poor memory issues.   In the ED, Vitals: BP 118/64, HR 62, SpO2 99% Labs: Sodium 140, potassium 3.3, glucose 170, creatinine 0.84, BUN 18, calcium 8.6, magnesium 2.4, BNP 144.0, WBC 10.8, RBC 4.43, hemoglobin 13.5, hematocrit 43.3, platelets 244 EKG: SR, 62bpm, ectopy with occasional PAC/ ectopy. RBBB CXR: Borderline enlargement of cardiomediastinal silhouette.  Mild bibasilar atelectasis.  Hypoexpanded lungs.  Admitted for further management with cardiology consulted. *Of note, patient is not the best historian and most of the above obtained via EMR. *PTA medications Eliquis 5 mg p.o. daily (once daily, rather than recommended BID dosage), Lasix 40 mg (20 mg or 1/2 tablet p.o. daily), pravastatin 40 mg p.o. daily. *No IVC filter placement done per preliminary EMR review to date   Past Medical History:  Diagnosis Date  . DVT (deep venous thrombosis) ([place])   . Insomnia   . Memory loss   .  Neuropathy     Past Surgical History:  Procedure Laterality Date  . pt does not remember surgery hx       Home Medications:  Prior to Admission medications   Medication Sig Start Date End Date Taking? Authorizing Provider  acetaminophen (TYLENOL) 500 MG tablet Take 1,000 mg by  mouth 3 (three) times daily as needed for mild pain or moderate pain.   Yes [provider]  apixaban (ELIQUIS) 5 MG TABS tablet Take 5 mg by mouth daily.   Yes [provider]  cholecalciferol (VITAMIN D) 1000 units tablet Take 2,000 Units by mouth daily.    Yes [provider]  furosemide (LASIX) 40 MG tablet Take 0.5 tablets (20 mg total) by mouth daily. 03/02/18 03/02/19 Yes Merlyn Lot, MD  Multiple Vitamins-Minerals (THERAVIM-M) TABS Take 1 tablet by mouth daily.   Yes [provider]  pantoprazole (PROTONIX) 40 MG tablet Take 1 tablet (40 mg total) by mouth daily. 01/02/18  Yes Demetrios Loll, MD  polyethylene glycol [facility] / Floria Raveling) packet Take 17 g by mouth daily as needed for mild constipation. Patient taking differently: Take 17 g by mouth daily.  06/30/17  Yes Vaughan Basta, MD  pravastatin (PRAVACHOL) 40 MG tablet Take 40 mg by mouth at bedtime.    Yes [provider]  risperiDONE (RISPERDAL) 2 MG tablet Take 2 mg by mouth at bedtime.   Yes [provider]  tiZANidine (ZANAFLEX) 4 MG capsule Take 4 mg by mouth 3 (three) times daily.   Yes [provider]  vitamin B-12 (CYANOCOBALAMIN) 1000 MCG tablet Take 2,000 mcg by mouth daily.   Yes [provider]  vitamin C (ASCORBIC ACID) 500 MG tablet Take 500 mg by mouth 2 (two) times daily.   Yes [provider]  zinc sulfate 220 (50 Zn) MG capsule Take 220 mg by mouth daily.   Yes [provider]  amoxicillin-clavulanate (AUGMENTIN) 875-125 MG tablet Take 1 tablet by mouth every 12 (twelve) hours. Patient not taking: Reported on 03/02/2018 01/02/18   Demetrios Loll, MD  docusate sodium (COLACE) 100 MG capsule Take 100 mg by mouth 2 (two) times daily as needed for mild constipation.    [provider]  Melatonin 10 MG TABS Take 10 mg by mouth at bedtime.    [provider]    Inpatient Medications: Scheduled Meds:  Continuous  Infusions:  PRN Meds:   Allergies:   No Known Allergies  Social History:   Social History   Socioeconomic History  . Marital status: Single    Spouse name: Not on file  . Number of children: Not on file  . Years of education: Not on file  . Highest education level: Not on file  Occupational History  . Not on file  Social Needs  . Financial resource strain: Not on file  . Food insecurity:    Worry: Not on file    Inability: Not on file  . Transportation needs:    Medical: Not on file    Non-medical: Not on file  Tobacco Use  . Smoking status: Never Smoker  . Smokeless tobacco: Never Used  Substance and Sexual Activity  . Alcohol use: No  . Drug use: No  . Sexual activity: Not Currently  Lifestyle  . Physical activity:    Days per week: Not on file    Minutes per session: Not on file  . Stress: Not on file  Relationships  . Social connections:  Talks on phone: Not on file    Gets together: Not on file    Attends religious service: Not on file    Active member of club or organization: Not on file    Attends meetings of clubs or organizations: Not on file    Relationship status: Not on file  . Intimate partner violence:    Fear of current or ex partner: Not on file    Emotionally abused: Not on file    Physically abused: Not on file    Forced sexual activity: Not on file  Other Topics Concern  . Not on file  Social History Narrative  . Not on file    Family History:    Family History  Problem Relation Age of Onset  . Diabetes Neg Hx   . Hypertension Neg Hx   . Deep vein thrombosis Neg Hx      ROS:  Please see the history of present illness.  Review of Systems  Unable to perform ROS: Mental status change  Cardiovascular: Positive for leg swelling.       Bilateral leg swelling  Musculoskeletal:       Patient immobile, chronic bilateral dvts  All other systems reviewed and are negative.   All other ROS reviewed and negative.     Physical  Exam/Data:   Vitals:   08/31/18 1200 08/31/18 1215 08/31/18 1230 08/31/18 1300  BP: 114/60  123/69 108/76  Pulse:    (!) 56  Resp: 16 17 15 17   Temp:      TempSrc:      SpO2:    99%  Weight:      Height:       No intake or output data in the 24 hours ending 08/31/18 1418 Filed Weights   08/31/18 1035  Weight: 77.9 kg   Body mass index is 23.3 kg/m.  General:  Elderly male in no acute distress HEENT: normal Neck: no JVD Vascular: No carotid bruits Cardiac:  normal S1, S2; RRR; 1/6 systolic murmur heard at UR ICS, 1/6 systolic murmur L lower sternum, 2/6 systolic murmur around 5th ICS Lungs:  clear to auscultation bilaterally, no wheezing, rhonchi or rales  Abd: soft, nontender, no hepatomegaly  Ext: 1-2+ bilateral lower extremity chronic edema Musculoskeletal:  No deformities, BUE and BLE strength normal and equal Skin: warm and dry. Left sided facial wound (sutured) Neuro:  CNs 2-12 intact, no focal abnormalities noted Psych:  Normal affect   EKG:  The EKG was personally reviewed and demonstrates:  Sinus rhythm with PACs, 62 bpm, right bundle branch block, LVH Telemetry:  Telemetry was personally reviewed and demonstrates: Patient not on telemetry at time of evaluation with no previous history of telemetry available.  Relevant CV Studies:  08/31/2016 TTE Study Conclusions - Left ventricle: The cavity size was normal. Wall thickness was   normal. Systolic function was normal. The estimated ejection   fraction was in the range of 60% to 65%. Wall motion was normal;   there were no regional wall motion abnormalities. Impressions: - Normal LVF   Normal Wall Motion   EF=60-65%   Normal Right side   Trivial AS. Normal study. No cardiac source of emboli was   indentified.  Laboratory Data:  Chemistry Recent Labs  Lab 08/29/18 2013 08/31/18 1042  NA 139 140  K 3.2* 3.3*  CL 105 105  CO2 26 26  GLUCOSE 127* 170*  BUN 22 18  CREATININE 1.11 0.84  CALCIUM 8.7*  8.6*  GFRNONAA 60* >60  GFRAA >60 >60  ANIONGAP 8 9    Recent Labs  Lab 08/29/18 2013  PROT 6.5  ALBUMIN 3.5  AST 37  ALT 25  ALKPHOS 125  BILITOT 0.6   Hematology Recent Labs  Lab 08/29/18 2013 08/31/18 1042  WBC 10.6* 10.8*  RBC 4.29 4.43  HGB 12.9* 13.5  HCT 41.9 43.3  MCV 97.7 97.7  MCH 30.1 30.5  MCHC 30.8 31.2  RDW 13.6 13.5  PLT 272 244   Cardiac Enzymes Recent Labs  Lab 08/29/18 2013  TROPONINI 0.05*   No results for input(s): TROPIPOC in the last 168 hours.  BNP Recent Labs  Lab 08/29/18 2013  BNP 144.0*    DDimer No results for input(s): DDIMER in the last 168 hours.  Radiology/Studies:  Dg Chest Port 1 View  Result Date: 08/29/2018 CLINICAL DATA:  Acute onset of immobility. Bilateral lower extremity edema. EXAM: PORTABLE CHEST 1 VIEW COMPARISON:  Chest radiograph performed 08/22/2018 FINDINGS: The lungs are hypoexpanded. Mild bibasilar atelectasis is noted. There is no evidence of pleural effusion or pneumothorax. The cardiomediastinal silhouette is borderline enlarged. No acute osseous abnormalities are seen. The patient's right shoulder arthroplasty is grossly unremarkable in appearance, though incompletely imaged. IMPRESSION: Lungs hypoexpanded, with mild bibasilar atelectasis. Borderline cardiomegaly. Electronically Signed   By: Garald Balding M.D.   On: 08/29/2018 23:28    Assessment and Plan:   Ectopy with h/o falls and leg weakness  -Minimally elevated troponin at 0.05 in the setting of recent cystitis and also infection of left sided facial wound. WBC elevated. Pending UA results. No reported CP, palpitations, or feeling of racing heart rate; however, patient is a poor historian with AMS. Not on telemetry at the time of our evaluation.  Per ED documentation, HR dipped while in the ED and patient with ectopy. Recommend reconnect telemetry. Cannot rule out arrhythmia at this time and, once connected, will continue to monitor on telemetry.  -  Daily BMET recommended as below. Hypokalemic at presentation, which may have contributed to ectopy. Repleting electrolytes with potassium and continue to monitor.   - Recommend that monitor patient with bed alarm given patient has a history of falls and EMR suggests frequently tries to ambulate if others are not present. We will continue to monitor on telemetry.  Hypokalemia  - K 3.3. Replete with goal 4.0. Could be contributing to ectopy. Magnesium 2.4 with goal 4.0. Daily BMET to monitor kidney function and electrolytes. Cr down from last admission and currently 0.84. Recommend monitor I/O, daily weights, and daily BMET. PTA lasix: At discharge, recommend addition of home with potassium supplementation as hypokalemic at presentation and, per EMR, PTA lasix at 20 mg daily (1/2 tablet of his 40mg  lasix daily) d/t chronic LEE.  Leg weakness with h/o and current bilateral DVT, chronic VTE and history of upper extremity DVT and bilateral pulmonary embolism  -Patient is immobile and has extensive history of DVTs. Ultrasound performed 05/2017 of upper left extremity shows occlusive thrombus involving the left internal jugular vein and proximal left subclavian vein. 2018 echo performed and unrevealing per previous documentation. Could update as outpatient but no additional cardiac testing indicated at this time. As noted by Dr. Saunders Revel on 10/2017, age appropriate cacer screening and hematology outpatient workup also recommended.  No documented history of IVC filter per preliminary review of EMR. Could consider filter in the future given chronic DVTs and immobility or patient. Consider workup by vascular surgery. - PTA medications include Eliquis  from 5 once a day with recommendation to increase to guideline recommended dosage of Eliquis 5mg  bid qd.  Patient did have BRBPR at time of admission but FOBT negative and documentation reported history of hemorrhoids.  Due to high risk of chronic bilateral DVT and history of  PE, benefits of anticoagulation outweigh risk at this time. Recommend daily CBC. PTA medications also include statin therapy with pravastatin 40 mg p.o. daily. Continue. - Continue PTA lasix with KCl.  Patient does not appear volume overloaded on exam and b/l LEE likely d/t chronic bilateral DVTs / post thrombotic syndrome. BNP obtained in the ED and not elevated at 144 - At discharge, recommendation for compression stockings to reduce lower extremity edema. Sodium restriction also recommended. Patient has documented b/l LEE with known bilateral dvts and on home lasix. Continue lasix at discharge with home potassium supplementation as above.  Hypercholesterolemia - Outpatient LDL / labs recommended to update LDL - 05/2017 LDL 158 - Continue statin therapy as above  Leukocytosis - As above, WBC elevated and pending UA. Previous cystitis. Left sided facial wound. On abx. Continue.  For questions or updates, please contact [place] Please consult [email or web address] for contact info under     Signed, Arvil Chaco, PA-C  08/31/2018 2:18 PM

## 2018-08-31 NOTE — ED Notes (Signed)
Called dietary and per [employer] Hawking they will send down meal tray for patient

## 2018-08-31 NOTE — ED Notes (Signed)
Pt given meal tray and set up at this time 

## 2018-08-31 NOTE — ED Notes (Signed)
Pt awake and meal tray setup for patient. Pt eating at this time

## 2018-08-31 NOTE — ED Notes (Signed)
Pt was moved into inpatient bed for comfort. Meal tray at bedside.

## 2018-08-31 NOTE — ED Provider Notes (Signed)
[facility] Emergency Department Provider Note   ____________________________________________   First MD Initiated Contact with Patient 08/31/18 1120     (approximate)  I have reviewed the triage vital signs and the nursing notes.   HISTORY  Chief Complaint Weakness and Diarrhea  M caveat: Limited due to the patient's poor memory, poor recollection  HPI Charles Nelson is a 85 y.o. male history of neuropathy, DVT, syncope previous sepsis  Had multiple evaluations for fatigue and weakness.  He is seen today as he continues to have ongoing weakness, also aide reported that he thought he had a bloody stool.  Patient denies being any pain.  States he feels okay.  He states that he likes to the hospital in [facility] where he is living.  Patient is of note confused, seems to be documented previously as well  Past Medical History:  Diagnosis Date  . DVT (deep venous thrombosis) ([place])   . Insomnia   . Memory loss   . Neuropathy     Patient Active Problem List   Diagnosis Date Noted  . Syncope 12/31/2017  . Sepsis ([place]) 12/31/2017  . Leg swelling 11/11/2017  . Essential hypertension 11/11/2017  . Pure hypercholesterolemia 11/11/2017  . Confirmed venous thromboembolism (VTE) 06/27/2017  . DVT (deep venous thrombosis) ([place]) 08/31/2016    Past Surgical History:  Procedure Laterality Date  . pt does not remember surgery hx      Prior to Admission medications   Medication Sig Start Date End Date Taking? Authorizing Provider  acetaminophen (TYLENOL) 500 MG tablet Take 500 mg by mouth every morning.     [provider]  acetaminophen (TYLENOL) 500 MG tablet Take 1,000 mg by mouth 3 (three) times daily as needed for mild pain or moderate pain.    [provider]  amoxicillin-clavulanate (AUGMENTIN) 875-125 MG tablet Take 1 tablet by mouth every 12 (twelve) hours. Patient not taking: Reported on 03/02/2018 01/02/18   Demetrios Loll, MD    apixaban (ELIQUIS) 5 MG TABS tablet Take 5 mg by mouth daily.    [provider]  cholecalciferol (VITAMIN D) 1000 units tablet Take 2,000 Units by mouth daily.     [provider]  docusate sodium (COLACE) 100 MG capsule Take 100 mg by mouth 2 (two) times daily as needed for mild constipation.    [provider]  furosemide (LASIX) 40 MG tablet Take 0.5 tablets (20 mg total) by mouth daily. 03/02/18 03/02/19  Merlyn Lot, MD  Melatonin 10 MG TABS Take 10 mg by mouth at bedtime.    [provider]  Multiple Vitamins-Minerals (THERAVIM-M) TABS Take 1 tablet by mouth daily.    [provider]  pantoprazole (PROTONIX) 40 MG tablet Take 1 tablet (40 mg total) by mouth daily. 01/02/18   Demetrios Loll, MD  polyethylene glycol [facility] / Floria Raveling) packet Take 17 g by mouth daily as needed for mild constipation. Patient taking differently: Take 17 g by mouth daily.  06/30/17   Vaughan Basta, MD  pravastatin (PRAVACHOL) 40 MG tablet Take 40 mg by mouth at bedtime.     [provider]  risperiDONE (RISPERDAL) 2 MG tablet Take 2 mg by mouth at bedtime.    [provider]  tiZANidine (ZANAFLEX) 4 MG capsule Take 4 mg by mouth 3 (three) times daily.    [provider]  vitamin B-12 (CYANOCOBALAMIN) 1000 MCG tablet Take 2,000 mcg by mouth daily.    [provider]  vitamin  C (ASCORBIC ACID) 500 MG tablet Take 500 mg by mouth 2 (two) times daily.    [provider]  zinc sulfate 220 (50 Zn) MG capsule Take 220 mg by mouth daily.    [provider]    Allergies Patient has no known allergies.  Family History  Problem Relation Age of Onset  . Diabetes Neg Hx   . Hypertension Neg Hx   . Deep vein thrombosis Neg Hx     Social History Social History   Tobacco Use  . Smoking status: Never Smoker  . Smokeless tobacco: Never Used  Substance Use Topics  . Alcohol use: No  . Drug use: No    Review  of Systems     ____________________________________________   PHYSICAL EXAM:  VITAL SIGNS: ED Triage Vitals  Enc Vitals Group     BP 08/31/18 1039 118/64     Pulse Rate 08/31/18 1039 62     Resp --      Temp 08/31/18 1039 98 F (36.7 C)     Temp Source 08/31/18 1039 Oral     SpO2 08/31/18 1039 99 %     Weight 08/31/18 1035 171 lb 12.8 oz (77.9 kg)     Height 08/31/18 1035 6' (1.829 m)     Head Circumference --      Peak Flow --      Pain Score 08/31/18 1035 0     Pain Loc --      Pain Edu? --      Excl. in [place]? --     Constitutional: Alert and oriented to himself, but does not know date or where he is at thinks he is at [facility].  Generally ill-appearing, but in no acute distress.  Very pleasant. Eyes: Conjunctivae are normal. Head: Atraumatic. Nose: No congestion/rhinnorhea. Mouth/Throat: Mucous membranes are moist. Neck: No stridor.  Cardiovascular: Normal rate, regular rhythm. Grossly normal heart sounds.  Good peripheral circulation.  However, at one point was evaluating him and noted that he had what appeared to be bigeminy on the monitor at which point his mechanical rate by palpation fell into the mid 30s, and on cycling his blood pressure also read out low 95/53.  This episode lasted about 2 minutes, thereafter back to normal sinus rate about 60-70 and his blood pressure returned to 443 systolic Respiratory: Normal respiratory effort.  No retractions. Lungs CTAB. Gastrointestinal: Soft and nontender. No distention. Musculoskeletal: No lower extremity tenderness nor edema. Neurologic:  Normal speech and language. No gross focal neurologic deficits are appreciated.  Skin:  Skin is warm, dry and intact. No rash noted. Psychiatric: Mood and affect are flat.  ____________________________________________   LABS (all labs ordered are listed, but only abnormal results are displayed)  Labs Reviewed  BASIC METABOLIC PANEL - Abnormal; Notable for the following  components:      Result Value   Potassium 3.3 (*)    Glucose, Bld 170 (*)    Calcium 8.6 (*)    All other components within normal limits  CBC - Abnormal; Notable for the following components:   WBC 10.8 (*)    All other components within normal limits  URINALYSIS, COMPLETE (UACMP) WITH MICROSCOPIC  CBG MONITORING, ED   ____________________________________________  EKG  Reviewed interpreted by me at 1140 Heart rate 60 QRS 140 QTc 440 Normal sinus rhythm, occasional PAC with slight pause.  Right bundle branch block.  No evidence ischemia ____________________________________________  RADIOLOGY  No results found.  ____________________________________________  PROCEDURES  Procedure(s) performed: None  Procedures  Critical Care performed: No  ____________________________________________   INITIAL IMPRESSION / ASSESSMENT AND PLAN / ED COURSE  Pertinent labs & imaging results that were available during my care of the patient were reviewed by me and considered in my medical decision making (see chart for details).   Patient represents again for weakness, this time possibly one bowel movement with blood in it.  On examination has normal formed brown stool, negative Hemoccult test.  Of note he is having occasional episodes of frequent PACs causing some bradycardia.  Discussed with Dr. Rockey Situ of cardiology, will admit the patient for observation and cardiology consultation.      ____________________________________________   FINAL CLINICAL IMPRESSION(S) / ED DIAGNOSES  Final diagnoses:  Bigeminy  Premature atrial contractions  Other specified hypotension  Bradycardia, unspecified        Note:  This document was prepared using Dragon voice recognition software and may include unintentional dictation errors       Delman Kitten, MD 08/31/18 1219

## 2018-08-31 NOTE — ED Notes (Signed)
Pt cleaned up and repositioned at this time. Pt had large BM. Pt given warm blankets and repositioned in bed at this time.

## 2018-08-31 NOTE — Clinical Social Work Note (Signed)
Patient is from [facility].  Lynelle Smoke is the administrator her number is [phone].  Patient's legal guardian is April Goggins [phone].  Evette Cristal, MSW, [facility] ED Covering CSW [phone] 08/31/2018 11:04 AM

## 2018-09-01 DIAGNOSIS — Z86718 Personal history of other venous thrombosis and embolism: Secondary | ICD-10-CM | POA: Diagnosis not present

## 2018-09-01 DIAGNOSIS — E876 Hypokalemia: Secondary | ICD-10-CM | POA: Diagnosis present

## 2018-09-01 DIAGNOSIS — R5381 Other malaise: Secondary | ICD-10-CM | POA: Diagnosis present

## 2018-09-01 DIAGNOSIS — R296 Repeated falls: Secondary | ICD-10-CM

## 2018-09-01 DIAGNOSIS — R531 Weakness: Secondary | ICD-10-CM | POA: Diagnosis present

## 2018-09-01 DIAGNOSIS — I491 Atrial premature depolarization: Secondary | ICD-10-CM | POA: Diagnosis present

## 2018-09-01 DIAGNOSIS — C44319 Basal cell carcinoma of skin of other parts of face: Secondary | ICD-10-CM | POA: Diagnosis present

## 2018-09-01 DIAGNOSIS — Z7901 Long term (current) use of anticoagulants: Secondary | ICD-10-CM | POA: Diagnosis not present

## 2018-09-01 DIAGNOSIS — I9589 Other hypotension: Secondary | ICD-10-CM | POA: Diagnosis present

## 2018-09-01 DIAGNOSIS — Z66 Do not resuscitate: Secondary | ICD-10-CM | POA: Diagnosis present

## 2018-09-01 DIAGNOSIS — Z8673 Personal history of transient ischemic attack (TIA), and cerebral infarction without residual deficits: Secondary | ICD-10-CM | POA: Diagnosis not present

## 2018-09-01 DIAGNOSIS — Z993 Dependence on wheelchair: Secondary | ICD-10-CM | POA: Diagnosis not present

## 2018-09-01 DIAGNOSIS — F0390 Unspecified dementia without behavioral disturbance: Secondary | ICD-10-CM | POA: Diagnosis present

## 2018-09-01 DIAGNOSIS — E785 Hyperlipidemia, unspecified: Secondary | ICD-10-CM | POA: Diagnosis present

## 2018-09-01 DIAGNOSIS — I10 Essential (primary) hypertension: Secondary | ICD-10-CM | POA: Diagnosis present

## 2018-09-01 DIAGNOSIS — I498 Other specified cardiac arrhythmias: Secondary | ICD-10-CM | POA: Diagnosis present

## 2018-09-01 DIAGNOSIS — Z86711 Personal history of pulmonary embolism: Secondary | ICD-10-CM | POA: Diagnosis not present

## 2018-09-01 DIAGNOSIS — W19XXXS Unspecified fall, sequela: Secondary | ICD-10-CM | POA: Diagnosis not present

## 2018-09-01 DIAGNOSIS — G629 Polyneuropathy, unspecified: Secondary | ICD-10-CM | POA: Diagnosis present

## 2018-09-01 DIAGNOSIS — M7989 Other specified soft tissue disorders: Secondary | ICD-10-CM

## 2018-09-01 DIAGNOSIS — Z9181 History of falling: Secondary | ICD-10-CM | POA: Diagnosis not present

## 2018-09-01 DIAGNOSIS — Z79899 Other long term (current) drug therapy: Secondary | ICD-10-CM | POA: Diagnosis not present

## 2018-09-01 DIAGNOSIS — W19XXXA Unspecified fall, initial encounter: Secondary | ICD-10-CM

## 2018-09-01 DIAGNOSIS — E7800 Pure hypercholesterolemia, unspecified: Secondary | ICD-10-CM | POA: Diagnosis present

## 2018-09-01 DIAGNOSIS — D72829 Elevated white blood cell count, unspecified: Secondary | ICD-10-CM | POA: Diagnosis present

## 2018-09-01 LAB — URINALYSIS, COMPLETE (UACMP) WITH MICROSCOPIC
Bacteria, UA: NONE SEEN
Bilirubin Urine: NEGATIVE
Glucose, UA: NEGATIVE mg/dL
Hgb urine dipstick: NEGATIVE
Ketones, ur: NEGATIVE mg/dL
Leukocytes, UA: NEGATIVE
NITRITE: NEGATIVE
Protein, ur: NEGATIVE mg/dL
Specific Gravity, Urine: 1.009 (ref 1.005–1.030)
Squamous Epithelial / HPF: NONE SEEN (ref 0–5)
pH: 7 (ref 5.0–8.0)

## 2018-09-01 LAB — BASIC METABOLIC PANEL
Anion gap: 5 (ref 5–15)
BUN: 18 mg/dL (ref 8–23)
CO2: 27 mmol/L (ref 22–32)
Calcium: 8.2 mg/dL — ABNORMAL LOW (ref 8.9–10.3)
Chloride: 108 mmol/L (ref 98–111)
Creatinine, Ser: 0.78 mg/dL (ref 0.61–1.24)
GFR calc non Af Amer: >60 mL/min (ref >60)
Glucose, Bld: 112 mg/dL — ABNORMAL HIGH (ref 70–99)
Potassium: 4.1 mmol/L (ref 3.5–5.1)
Sodium: 140 mmol/L (ref 135–145)

## 2018-09-01 LAB — CBC
HCT: 37.8 % — ABNORMAL LOW (ref 39.0–52.0)
HEMOGLOBIN: 11.7 g/dL — AB (ref 13.0–17.0)
MCH: 30.7 pg (ref 26.0–34.0)
MCHC: 31 g/dL (ref 30.0–36.0)
MCV: 99.2 fL (ref 80.0–100.0)
Platelets: 215 10*3/uL (ref 150–400)
RBC: 3.81 MIL/uL — ABNORMAL LOW (ref 4.22–5.81)
RDW: 13.7 % (ref 11.5–15.5)
WBC: 11.7 10*3/uL — ABNORMAL HIGH (ref 4.0–10.5)
nRBC: 0 % (ref 0.0–0.2)

## 2018-09-01 LAB — TROPONIN I
TROPONIN I: 0.03 ng/mL — AB (ref <0.03)
Troponin I: 0.04 ng/mL (ref <0.03)
Troponin I: 0.04 ng/mL (ref <0.03)

## 2018-09-01 LAB — MRSA PCR SCREENING: MRSA by PCR: NEGATIVE

## 2018-09-01 MED ORDER — NYSTATIN 100000 UNIT/GM EX OINT
TOPICAL_OINTMENT | Freq: Two times a day (BID) | CUTANEOUS | Status: DC
  Administered 2018-09-01 – 2018-09-04 (×7): via TOPICAL
  Filled 2018-09-01: qty 15

## 2018-09-01 NOTE — Progress Notes (Signed)
PT Cancellation Note  Patient Details Name: Charles Nelson MRN: [id number] DOB: 01/15/1930   Cancelled Treatment:    Reason Eval/Treat Not Completed: Other (comment).  Per chart review, pt with Bil DVT and Cardiology recommending Vascular input for potential IVC.  Spoke with Dr. Posey Pronto who suggested to hold PT until he sees pt and POC is established.  Will continue to follow acutely.    Collie Siad PT, DPT 09/01/2018, 1:22 PM

## 2018-09-01 NOTE — Progress Notes (Addendum)
No murmur related problems I see that some advanced care plan.  Purpose of the Encounter: Goals of care  Parties in Attendance: I discussed with patient's niece Charles Nelson  Patient's Decision Capacity: Not intact  Subjective/Patient's story:  Patient is 85 year old with history of bilateral DVT dementia was brought to the emergency room with generalized weakness  Objective/Medical story  I discussed with her regarding overall poor prognosis and worsening condition.  Recommended palliative care input  Goals of care determination:   Palliative care consult  CODE STATUS: dnr  Time spent discussing advanced care planning: 16 minutes

## 2018-09-01 NOTE — Plan of Care (Signed)
°  Problem: Coping: °Goal: Level of anxiety will decrease °Outcome: Progressing °  °

## 2018-09-01 NOTE — Progress Notes (Signed)
[place] at [facility]                                                                                                                                                                                  Patient Demographics   Charles Nelson, is a 85 y.o. male, DOB - 15-Dec-1929, IEP:[id number]  Admit date - 08/31/2018   Admitting Physician Saundra Shelling, MD  Outpatient Primary MD for the patient is Lorelee Market, MD   LOS - 0  Subjective: Patient admitted with generalized weakness has had multiple visits to the ER.    Review of Systems:   CONSTITUTIONAL: Dementia unable to provide   Vitals:   Vitals:   09/01/18 0000 09/01/18 0500 09/01/18 1400 09/01/18 1417  BP: 125/72 133/77  (!) 155/79  Pulse: (!) 58 64  72  Resp: 20 16  18   Temp: 98.2 F (36.8 C)   98.8 F (37.1 C)  TempSrc: Oral Oral  Oral  SpO2: 98% 99%  98%  Weight:   78.3 kg   Height:   6' (1.829 m)     Wt Readings from Last 3 Encounters:  09/01/18 78.3 kg  08/29/18 88.9 kg  08/23/18 81.6 kg     Intake/Output Summary (Last 24 hours) at 09/01/2018 1619 Last data filed at 09/01/2018 1524 Gross per 24 hour  Intake -  Output 50 ml  Net -50 ml    Physical Exam:   GENERAL: Chronically ill-appearing HEAD, EYES, EARS, NOSE AND THROAT: Atraumatic, normocephalic. Extraocular muscles are intact. Pupils equal and reactive to light. Sclerae anicteric. No conjunctival injection. No oro-pharyngeal erythema.  NECK: Supple. There is no jugular venous distention. No bruits, no lymphadenopathy, no thyromegaly.  HEART: Regular rate and rhythm,. No murmurs, no rubs, no clicks.  LUNGS: Clear to auscultation bilaterally. No rales or rhonchi. No wheezes.  ABDOMEN: Soft, flat, nontender, nondistended. Has good bowel sounds. No hepatosplenomegaly appreciated.  EXTREMITIES: No evidence of any cyanosis, clubbing, or peripheral edema.  +2 pedal and radial pulses bilaterally.  NEUROLOGIC: Confused SKIN:  Moist and warm with no rashes appreciated.  Psych: Not anxious, depressed LN: No inguinal LN enlargement    Antibiotics   Anti-infectives (From admission, onward)   None      Medications   Scheduled Meds: . apixaban  5 mg Oral BID  . cholecalciferol  2,000 Units Oral Daily  . Melatonin  10 mg Oral QHS  . multivitamin with minerals  1 tablet Oral Daily  . nystatin ointment   Topical BID  . pantoprazole  40 mg Oral Daily  . polyethylene glycol  17 g Oral Daily  . pravastatin  40 mg Oral  QHS  . risperiDONE  2 mg Oral QHS  . tiZANidine  4 mg Oral TID  . vitamin B-12  2,000 mcg Oral Daily  . vitamin C  500 mg Oral BID  . zinc sulfate  220 mg Oral Daily   Continuous Infusions: PRN Meds:.acetaminophen **OR** acetaminophen, docusate sodium, ondansetron **OR** ondansetron (ZOFRAN) IV   Data Review:   Micro Results Recent Results (from the past 240 hour(s))  Urine Culture     Status: None   Collection Time: 08/22/18  7:57 PM  Result Value Ref Range Status   Specimen Description   Final    URINE, RANDOM Performed at [facility], [address], [place], [place] [address]    Special Requests   Final    NONE Performed at [facility], [address] [place], [place] [address]    Culture   Final    NO GROWTH Performed at [facility], [place] [address] [place], [place] [address]    Report Status 08/25/2018 FINAL  Final    Radiology Reports Dg Chest 1 View  Result Date: 08/22/2018 CLINICAL DATA:  Right chest pain and weakness for 3 days. EXAM: CHEST  1 VIEW COMPARISON:  04/25/2018 FINDINGS: Stable mild cardiomegaly. Aortic atherosclerosis. Low lung volumes are noted, however both lungs are clear. Right shoulder prosthesis again noted. IMPRESSION: Stable cardiomegaly.  No active lung disease. Electronically Signed   By: Earle Gell M.D.   On: 08/22/2018 18:23   Ct Head Wo Contrast  Result Date: 08/22/2018 CLINICAL DATA:  Patient leaning to the  right x2 days with muscle weakness. EXAM: CT HEAD WITHOUT CONTRAST TECHNIQUE: Contiguous axial images were obtained from the base of the skull through the vertex without intravenous contrast. COMPARISON:  07/31/2018 FINDINGS: Brain: Involutional changes brain with chronic mild-to-moderate small vessel ischemia. No acute intracranial hemorrhage, mass or edema. No midline shift. No extra-axial collections midline fourth ventricle and basal cisterns without effacement. Vascular: Moderate atherosclerosis at the skull base. No hyperdense vessel sign Skull: Negative for acute osseous abnormality. Sinuses/Orbits: Ethmoid sinus mucosal thickening. Bilateral cataract extractions. Other: None IMPRESSION: Atrophy with chronic small vessel ischemia. No acute intracranial abnormality. Electronically Signed   By: Ashley Royalty M.D.   On: 08/22/2018 18:14   Dg Chest Port 1 View  Result Date: 08/29/2018 CLINICAL DATA:  Acute onset of immobility. Bilateral lower extremity edema. EXAM: PORTABLE CHEST 1 VIEW COMPARISON:  Chest radiograph performed 08/22/2018 FINDINGS: The lungs are hypoexpanded. Mild bibasilar atelectasis is noted. There is no evidence of pleural effusion or pneumothorax. The cardiomediastinal silhouette is borderline enlarged. No acute osseous abnormalities are seen. The patient's right shoulder arthroplasty is grossly unremarkable in appearance, though incompletely imaged. IMPRESSION: Lungs hypoexpanded, with mild bibasilar atelectasis. Borderline cardiomegaly. Electronically Signed   By: Garald Balding M.D.   On: 08/29/2018 23:28   Ct Renal Stone Study  Result Date: 08/23/2018 CLINICAL DATA:  Bilateral flank pain EXAM: CT ABDOMEN AND PELVIS WITHOUT CONTRAST TECHNIQUE: Multidetector CT imaging of the abdomen and pelvis was performed following the standard protocol without IV contrast. COMPARISON:  06/07/2017 FINDINGS: Lower chest: Cardiomegaly with aortic atherosclerosis. Three-vessel coronary  arteriosclerosis is noted without pericardial effusion or thickening. Faint punctate hyperdensities at the bases, right greater than left may represent stigmata prior contrast aspiration or potentially microlithiasis. Hepatobiliary: Right hepatic granuloma near the dome. Cholecystectomy. Pancreas: Stable pancreatic body exophytic cyst measuring approximately 1 cm. No ductal dilatation or inflammation. Spleen: Small splenic granulomata. No splenomegaly. Adrenals/Urinary Tract:  Nonobstructing left renal pelvic stone measuring approximately 2 mm, series 2/41 an interpolar right renal calculus also nonobstructing. No hydroureteronephrosis. Small extrarenal pelvis is seen left. Mild mural thickening of the anterior bladder dome is nonspecific but may represent stigmata cystitis. The urinary bladder is partially distended. Likely small urachal remnant at the bladder dome is also noted. Normal bilateral adrenal glands. Stomach/Bowel: Decompressed stomach with normal small bowel rotation. Moderate stool retention within the colon. No bowel obstruction. Sigmoid diverticulosis with faint pericolonic mesenteric fatty induration raise the possibility of mild sigmoid diverticulitis. No abscess or bowel obstruction. No free air Vascular/Lymphatic: Aortoiliac and branch vessel atherosclerosis. No aneurysm. Reproductive: Enlarged prostate. Other: Suspect left anterior pelvic fat necrosis near the inguinal canal. Musculoskeletal: New 25% compression fracture of T10 without retropulsion. Biconcave moderate central osteoporotic compression fracture of L2 with 2 mm of retrolisthesis. Stable superior endplate compression of L1. IMPRESSION: 1. Age-indeterminate but new since prior October, 2018 CT are a 25% compression fracture of T10 without retropulsion and day moderate biconcave osteoporotic appearing compression fracture of L2 with 2 mm of retrolisthesis. Stable superior endplate compression of L1. These findings may be accounting  for the patient's flank pain given lack of obstructing calculus seen. Correlate for pain at the T10 and L2 levels. MRI may help for better assessment if clinically warranted. 2. Right punctate nonobstructing interpolar renal calculus with left renal pelvic nonobstructing calculus noted. 3. Sigmoid diverticulosis with faint pericolonic fatty induration suspicious for mild sigmoid diverticulitis. No bowel obstruction or perforation. 4. Aortoiliac and branch vessel atherosclerosis. 5. Stable pancreatic body cyst measuring 1 cm. 6. Mild mural thickening of the anterior bladder dome is nonspecific but may represent stigmata of cystitis. Electronically Signed   By: Ashley Royalty M.D.   On: 08/23/2018 19:27     CBC Recent Labs  Lab 08/29/18 2013 08/31/18 1042 09/01/18 0636  WBC 10.6* 10.8* 11.7*  HGB 12.9* 13.5 11.7*  HCT 41.9 43.3 37.8*  PLT 272 244 215  MCV 97.7 97.7 99.2  MCH 30.1 30.5 30.7  MCHC 30.8 31.2 31.0  RDW 13.6 13.5 13.7  LYMPHSABS 1.3  --   --   MONOABS 0.8  --   --   EOSABS 0.0  --   --   BASOSABS 0.0  --   --     Chemistries  Recent Labs  Lab 08/29/18 2013 08/31/18 1042 09/01/18 0636  NA 139 140 140  K 3.2* 3.3* 4.1  CL 105 105 108  CO2 26 26 27   GLUCOSE 127* 170* 112*  BUN 22 18 18   CREATININE 1.11 0.84 0.78  CALCIUM 8.7* 8.6* 8.2*  MG  --  2.4  --   AST 37  --   --   ALT 25  --   --   ALKPHOS 125  --   --   BILITOT 0.6  --   --    ------------------------------------------------------------------------------------------------------------------ estimated creatinine clearance is 72.8 mL/min (by C-G formula based on SCr of 0.78 mg/dL). ------------------------------------------------------------------------------------------------------------------ No results for input(s): HGBA1C in the last 72 hours. ------------------------------------------------------------------------------------------------------------------ No results for input(s): CHOL, HDL, LDLCALC,  TRIG, CHOLHDL, LDLDIRECT in the last 72 hours. ------------------------------------------------------------------------------------------------------------------ No results for input(s): TSH, T4TOTAL, T3FREE, THYROIDAB in the last 72 hours.  Invalid input(s): FREET3 ------------------------------------------------------------------------------------------------------------------ No results for input(s): VITAMINB12, FOLATE, FERRITIN, TIBC, IRON, RETICCTPCT in the last 72 hours.  Coagulation profile No results for input(s): INR, PROTIME in the last 168 hours.  No results for input(s): DDIMER in the last 72 hours.  Cardiac Enzymes Recent Labs  Lab 08/31/18 2344 09/01/18 0636 09/01/18 1407  TROPONINI 0.04* 0.04* 0.03*   ------------------------------------------------------------------------------------------------------------------ Invalid input(s): POCBNP    Assessment & Plan   85 year old elderly male patient with history of dementia, DVT on Eliquis, neuropathy, insomnia presented to emergency room for weakness  -Atrial arrhythmia Seen by cardiology they feel that patient has ecopty and APC's Do not feel that this is related to the weakness   -Hypokalemia acute Status post replacement  -Dementia supportive care continue therapy with Risperdal  -Hypotension Resolved, Lasix on hold  -History of bilateral DVT Continue Eliquis no changes needs to be made  -Hyperlipidemia continue Pravachol       Code Status Orders  (From admission, onward)         Start     Ordered   08/31/18 2324  Do not attempt resuscitation (DNR)  Continuous    Question Answer Comment  In the event of cardiac or respiratory ARREST Do not call a "code blue"   In the event of cardiac or respiratory ARREST Do not perform Intubation, CPR, defibrillation or ACLS   In the event of cardiac or respiratory ARREST Use medication by any route, position, wound care, and other measures to relive  pain and suffering. May use oxygen, suction and manual treatment of airway obstruction as needed for comfort.      08/31/18 2323        Code Status History    Date Active Date Inactive Code Status Order ID Comments User Context   12/31/2017 1038 01/03/2018 1709 DNR [id number]  Demetrios Loll, MD Inpatient   06/27/2017 2132 06/30/2017 2147 Full Code [id number]  Hillary Bow, MD ED   08/31/2016 1902 09/03/2016 1701 Full Code [id number]  Henreitta Leber, MD Inpatient           Consults cardiology  DVT Prophylaxis continue Eliquis  Lab Results  Component Value Date   PLT 215 09/01/2018     Time Spent in minutes   35 minutes spent  Greater than 50% of time spent in care coordination and counseling patient regarding the condition and plan of care.   Dustin Flock M.D on 09/01/2018 at 4:19 PM  Between 7am to 6pm - Pager - [phone]  After 6pm go to [email or web address] - Proofreader  [employer]   Office  [phone]

## 2018-09-01 NOTE — Progress Notes (Signed)
Progress Note  Patient Name: Charles Nelson Date of Encounter: 09/01/2018  Primary Cardiologist: Nelva Bush, MD , [facility]  Subjective   No complaints this morning, denies any lightheadedness, dizziness Telemetry reviewed showing APCs, no pauses, no long runs of tachycardia Occasionally with clusters of what appear to be atrial tachycardia and triplets He is asymptomatic  Inpatient Medications    Scheduled Meds: . apixaban  5 mg Oral BID  . cholecalciferol  2,000 Units Oral Daily  . Melatonin  10 mg Oral QHS  . multivitamin with minerals  1 tablet Oral Daily  . nystatin ointment   Topical BID  . pantoprazole  40 mg Oral Daily  . polyethylene glycol  17 g Oral Daily  . pravastatin  40 mg Oral QHS  . risperiDONE  2 mg Oral QHS  . tiZANidine  4 mg Oral TID  . vitamin B-12  2,000 mcg Oral Daily  . vitamin C  500 mg Oral BID  . zinc sulfate  220 mg Oral Daily   Continuous Infusions:  PRN Meds: acetaminophen **OR** acetaminophen, docusate sodium, ondansetron **OR** ondansetron (ZOFRAN) IV   Vital Signs    Vitals:   08/31/18 2200 08/31/18 2230 09/01/18 0000 09/01/18 0500  BP: 121/74 (!) 127/103 125/72 133/77  Pulse:   (!) 58 64  Resp: 18 19 20 16   Temp:   98.2 F (36.8 C)   TempSrc:   Oral Oral  SpO2:   98% 99%  Weight:      Height:       No intake or output data in the 24 hours ending 09/01/18 1314 Filed Weights   08/31/18 1035  Weight: 77.9 kg    Telemetry    Normal sinus rhythm with APCs, rarely in a bigeminal pattern, occasional clusters of atrial tachycardia, triplets- Personally Reviewed  ECG    - Personally Reviewed  Physical Exam   GEN: No acute distress.  Minimally conversant Neck: No JVD Cardiac: RRR, no murmurs, rubs, or gallops.  Respiratory: Clear to auscultation bilaterally. GI: Soft, nontender, non-distended  MS: No edema; No deformity. Neuro:  Nonfocal  Psych: Flat affect,  Minimally conversant  Labs    Chemistry Recent Labs    Lab 08/29/18 2013 08/31/18 1042 09/01/18 0636  NA 139 140 140  K 3.2* 3.3* 4.1  CL 105 105 108  CO2 26 26 27   GLUCOSE 127* 170* 112*  BUN 22 18 18   CREATININE 1.11 0.84 0.78  CALCIUM 8.7* 8.6* 8.2*  PROT 6.5  --   --   ALBUMIN 3.5  --   --   AST 37  --   --   ALT 25  --   --   ALKPHOS 125  --   --   BILITOT 0.6  --   --   GFRNONAA 60* >60 >60  GFRAA >60 >60 >60  ANIONGAP 8 9 5      Hematology Recent Labs  Lab 08/29/18 2013 08/31/18 1042 09/01/18 0636  WBC 10.6* 10.8* 11.7*  RBC 4.29 4.43 3.81*  HGB 12.9* 13.5 11.7*  HCT 41.9 43.3 37.8*  MCV 97.7 97.7 99.2  MCH 30.1 30.5 30.7  MCHC 30.8 31.2 31.0  RDW 13.6 13.5 13.7  PLT 272 244 215    Cardiac Enzymes Recent Labs  Lab 08/29/18 2013 08/31/18 2344 09/01/18 0636  TROPONINI 0.05* 0.04* 0.04*   No results for input(s): TROPIPOC in the last 168 hours.   BNP Recent Labs  Lab 08/29/18 2013  BNP 144.0*  DDimer No results for input(s): DDIMER in the last 168 hours.   Radiology    No results found.  Cardiac Studies   Echo: 08/2016 Normal LVF   Normal Wall Motion   EF=60-65%   Normal Right side   Trivial AS. Normal study.  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 essentially wheelchair-bound though tries to get up on his own without assistance leading to falls -If current facility unable to manage his fall risk, and chronic medical issues including leg swelling ,  may need different disposition  Ectopy/APCs We will review telemetry in the morning Suspect unrelated to his frequent presentations,  chronic leg weakness, frequent falls   Hypokalemia Repleted It would appear he is on Lasix daily Would continue on potassium 20 mEq daily as outpatient  Lower extremity edema Chronic, not cardiac related Secondary to longstanding history of bilateral DVTs Would recommend compression hose, order placed this morning Lasix likely of little clinical benefit  History of DVT, PE High risk of recurrent thrombus formation Despite care facility detailing blood in his stool, would continue Eliquis 5 twice daily He is not anemic, hemoglobin stable Risk of recurrent DVT by holding anticoagulation is markedly elevated Holding his anticoagulation  was tested in the past and resulted in bilateral DVTs with PE 2018    Total encounter time more than 25 minutes  Greater than 50% was spent in counseling and coordination of care with the patient   [facility] will sign off.   Medication Recommendations: Continue Eliquis 5 twice daily, nystatin cream for toe fungus Other recommendations (labs, testing, etc): Compression hose to the knees bilaterally Follow up as an outpatient: With routine medical doctor  For questions or updates, please contact [place] Please consult [email or web address] for contact info under        Signed, Ida Rogue, MD  09/01/2018, 1:14 PM

## 2018-09-01 NOTE — Progress Notes (Addendum)
Patient's legal guardian, April Goggings, called to complete admission profile on patient. Legal guardian requested to speak with MD. Will page MD and notify. Will continue to monitor patient.

## 2018-09-02 ENCOUNTER — Inpatient Hospital Stay: Payer: Medicare Other

## 2018-09-02 LAB — BASIC METABOLIC PANEL
ANION GAP: 5 (ref 5–15)
BUN: 19 mg/dL (ref 8–23)
CHLORIDE: 108 mmol/L (ref 98–111)
CO2: 27 mmol/L (ref 22–32)
Calcium: 8.2 mg/dL — ABNORMAL LOW (ref 8.9–10.3)
Creatinine, Ser: 0.81 mg/dL (ref 0.61–1.24)
GFR calc Af Amer: >60 mL/min (ref >60)
GFR calc non Af Amer: >60 mL/min (ref >60)
Glucose, Bld: 110 mg/dL — ABNORMAL HIGH (ref 70–99)
Potassium: 3.8 mmol/L (ref 3.5–5.1)
SODIUM: 140 mmol/L (ref 135–145)

## 2018-09-02 LAB — GLUCOSE, CAPILLARY: Glucose-Capillary: 104 mg/dL — ABNORMAL HIGH (ref 70–99)

## 2018-09-02 NOTE — Evaluation (Addendum)
Physical Therapy Evaluation Patient Details Name: Charles Nelson MRN: [id number] DOB: 1929/12/02 Today's Date: 09/02/2018   History of Present Illness  Charles Nelson is an 85yo male who comes to [facility] on 1/2d/t generalized weakness. 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 PTA pt lived in a family care home.   Clinical Impression  Pt admitted with above diagnosis. Pt currently with functional limitations due to the deficits listed below (see "PT Problem List"). Upon entry, pt in bed, no family/caregiver present. The pt is asleep, easily awakened to voice, and agreeable to participate. The pt is alert and oriented to self, pleasant, with flat affect, and following simple commands consistently. Pt able to provide information from long-term memory recall, his home-town and service in the [employer], but is unable to provide reliable details from any recent time: this is consistent with prior admissions. Pt requires minA for bed mobility, and max effort to stand from EOB with PT assist for stability, but tires quickly. AMB not appropriate to try at this time. Functional mobility assessment demonstrates increased effort/time requirements, poor tolerance, and need for physical assistance, whereas the patient performed these at a slightly higher level of independence PTA presumably (limited information is available.) It is difficult to imagine the patient tolerating any significant AMB distances in the past 6-12 months, and given his advanced dementia exhaustive rehab efforts to restore any AMB functional would seem ill-advised at this time. Be that as it may, HHPT services would still serve a vital role in wellness and independence with bed mobility and transfers going forward. Pt will benefit from skilled PT intervention to increase independence and safety with basic mobility in preparation for discharge to the venue listed below.       Follow Up Recommendations Supervision/Assistance - 24  hour;Supervision for mobility/OOB;Home health PT    Equipment Recommendations  None recommended by PT    Recommendations for Other Services       Precautions / Restrictions Precautions Precautions: Fall Restrictions Weight Bearing Restrictions: No      Mobility  Bed Mobility Overal bed mobility: Needs Assistance       Supine to sit: HOB elevated     General bed mobility comments: needs most help with scooting forward, but is generally weak and requires additional time  Transfers Overall transfer level: Needs assistance Equipment used: Rolling walker (2 wheeled) Transfers: Sit to/from Stand Sit to Stand: Min assist         General transfer comment: Pt able to hold PT's hands and pull self to standing, 2-3x, but unable to take lateral steps at EOB  Ambulation/Gait Ambulation/Gait assistance: (not appropraite d/t level of weakness at this time. Medical record does not support a particularly ambullatory PLOF over the past several admissions. )              Stairs            Wheelchair Mobility    Modified Rankin (Stroke Patients Only)       Balance Overall balance assessment: Needs assistance Sitting-balance support: Feet supported Sitting balance-Leahy Scale: Good     Standing balance support: Bilateral upper extremity supported Standing balance-Leahy Scale: Zero                               Pertinent Vitals/Pain Pain Assessment: No/denies pain    Home Living Family/patient expects to be discharged to:: Assisted living  Home Equipment: (unable to establish d/t dementia) Additional Comments: Pt is a poor historian and is unable to provide information regarding home layout or PLOF.  Per chart review he is from a family home.  Unsure of amount of assist he was receiving.     Prior Function Level of Independence: Needs assistance      ADL's / Homemaking Assistance Needed: likely dependent d/t advanced  dementia and long term placement   Comments: Pt is a poor historian and is unable to provide information regarding home layout or PLOF.  Per chart review he is from a family home.  Unsure of amount of assist he was receiving.      Hand Dominance   Dominant Hand: Right    Extremity/Trunk Assessment   Upper Extremity Assessment Upper Extremity Assessment: Generalized weakness    Lower Extremity Assessment Lower Extremity Assessment: Generalized weakness    Cervical / Trunk Assessment Cervical / Trunk Assessment: Kyphotic  Communication      Cognition Arousal/Alertness: Awake/alert Behavior During Therapy: Flat affect Overall Cognitive Status: History of cognitive impairments - at baseline                                        General Comments      Exercises     Assessment/Plan    PT Assessment Patient needs continued PT services  PT Problem List Decreased strength;Decreased range of motion;Decreased activity tolerance;Decreased balance;Decreased mobility;Decreased cognition;Decreased knowledge of use of DME;Decreased safety awareness       PT Treatment Interventions DME instruction;Gait training;Functional mobility training;Therapeutic activities;Therapeutic exercise;Balance training;Neuromuscular re-education;Cognitive remediation;Patient/family education;Wheelchair mobility training    PT Goals (Current goals can be found in the Care Plan section)  Acute Rehab PT Goals PT Goal Formulation: Patient unable to participate in goal setting    Frequency Min 2X/week   Barriers to discharge Other (comment)      Co-evaluation               AM-PAC PT "6 Clicks" Mobility  Outcome Measure Help needed turning from your back to your side while in a flat bed without using bedrails?: A Little Help needed moving from lying on your back to sitting on the side of a flat bed without using bedrails?: A Little Help needed moving to and from a bed to a  chair (including a wheelchair)?: A Lot Help needed standing up from a chair using your arms (e.g., wheelchair or bedside chair)?: A Little Help needed to walk in hospital room?: Total Help needed climbing 3-5 steps with a railing? : Total 6 Click Score: 13    End of Session   Activity Tolerance: Patient limited by fatigue Patient left: in bed;with bed alarm set(chair'd out position, two leg rails up, Rt hed rail down) Nurse Communication: Mobility status PT Visit Diagnosis: Muscle weakness (generalized) (M62.81);Unsteadiness on feet (R26.81);Other abnormalities of gait and mobility (R26.89);Difficulty in walking, not elsewhere classified (R26.2)    Time: 4098-1191 PT Time Calculation (min) (ACUTE ONLY): 18 min   Charges:   PT Evaluation $PT Eval Moderate Complexity: 1 Mod          11:35 AM, 09/02/18 Etta Grandchild, PT, DPT Physical Therapist - [facility]  [phone] ([place])    Tylie Golonka C 09/02/2018, 11:30 AM

## 2018-09-02 NOTE — NC FL2 (Signed)
[place] LEVEL OF CARE SCREENING TOOL     IDENTIFICATION  Patient Name: Charles Nelson Birthdate: 06/10/1930 Sex: male Admission Date (Current Location): 08/31/2018  [facility] [place] and [place] Number:  Engineering geologist and Address:         Provider Number: [id number]  Attending Physician Name and Address:  Sela Hua, MD  Relative Name and Phone Number:  Goggings,April Legal Guardian [phone]     Current Level of Care: Hospital Recommended Level of Care: [place] Prior Approval Number:    Date Approved/Denied:   PASRR Number: [id number]  Discharge Plan: SNF    Current Diagnoses: Patient Active Problem List   Diagnosis Date Noted  . Weakness 09/01/2018  . Falls 09/01/2018  . Atrial arrhythmia 08/31/2018  . Sepsis ([place]) 12/31/2017  . Leg swelling 11/11/2017  . Essential hypertension 11/11/2017  . Pure hypercholesterolemia 11/11/2017  . Confirmed venous thromboembolism (VTE) 06/27/2017  . DVT (deep venous thrombosis) ([place]) 08/31/2016    Orientation RESPIRATION BLADDER Height & Weight     Self, Place  Normal Incontinent Weight: 172 lb 11.2 oz (78.3 kg) Height:  6' (182.9 cm)  BEHAVIORAL SYMPTOMS/MOOD NEUROLOGICAL BOWEL NUTRITION STATUS      Continent Diet(Heart healthy)  AMBULATORY STATUS COMMUNICATION OF NEEDS Skin   Extensive Assist Verbally Normal                       Personal Care Assistance Level of Assistance  Bathing, Feeding, Dressing Bathing Assistance: Limited assistance Feeding assistance: Limited assistance Dressing Assistance: Limited assistance     Functional Limitations Info  Sight, Hearing, Speech Sight Info: Adequate Hearing Info: Adequate Speech Info: Adequate    SPECIAL CARE FACTORS FREQUENCY  PT (By licensed PT), OT (By licensed OT)     PT Frequency: Up to 5X per week OT Frequency: Up to 3X per week            Contractures Contractures Info: Not present    Additional Factors  Info  Code Status, Allergies Code Status Info: DNR Allergies Info: No Known Allergies           Current Medications (09/02/2018):  This is the current hospital active medication list Current Facility-Administered Medications  Medication Dose Route Frequency Provider Last Rate Last Dose  . acetaminophen (TYLENOL) tablet 650 mg  650 mg Oral Q6H PRN Saundra Shelling, MD       Or  . acetaminophen (TYLENOL) suppository 650 mg  650 mg Rectal Q6H PRN Pyreddy, Reatha Harps, MD      . apixaban (ELIQUIS) tablet 5 mg  5 mg Oral BID Saundra Shelling, MD   5 mg at 09/02/18 0847  . cholecalciferol (VITAMIN D) tablet 2,000 Units  2,000 Units Oral Daily Saundra Shelling, MD   2,000 Units at 09/02/18 0848  . docusate sodium (COLACE) capsule 100 mg  100 mg Oral BID PRN Saundra Shelling, MD      . Melatonin TABS 10 mg  10 mg Oral QHS Pyreddy, Reatha Harps, MD   10 mg at 09/01/18 2057  . multivitamin with minerals tablet 1 tablet  1 tablet Oral Daily Saundra Shelling, MD   1 tablet at 09/02/18 0848  . nystatin ointment (MYCOSTATIN)   Topical BID Mickle Plumb, Jacquelyn D, PA-C      . ondansetron [facility]) tablet 4 mg  4 mg Oral Q6H PRN Saundra Shelling, MD       Or  . ondansetron (ZOFRAN) injection 4 mg  4 mg Intravenous Q6H  PRN Saundra Shelling, MD      . pantoprazole (PROTONIX) EC tablet 40 mg  40 mg Oral Daily Pyreddy, Reatha Harps, MD   40 mg at 09/02/18 0848  . polyethylene glycol (MIRALAX / GLYCOLAX) packet 17 g  17 g Oral Daily Pyreddy, Reatha Harps, MD   17 g at 09/02/18 0846  . pravastatin (PRAVACHOL) tablet 40 mg  40 mg Oral QHS Saundra Shelling, MD   40 mg at 09/01/18 2057  . risperiDONE (RISPERDAL) tablet 2 mg  2 mg Oral QHS Saundra Shelling, MD   2 mg at 09/01/18 2057  . tiZANidine (ZANAFLEX) tablet 4 mg  4 mg Oral TID Saundra Shelling, MD   4 mg at 09/02/18 1509  . vitamin B-12 (CYANOCOBALAMIN) tablet 2,000 mcg  2,000 mcg Oral Daily Pyreddy, Reatha Harps, MD   2,000 mcg at 09/02/18 0847  . vitamin C (ASCORBIC ACID) tablet 500 mg  500 mg Oral BID Saundra Shelling, MD   500 mg at 09/02/18 0847  . zinc sulfate capsule 220 mg  220 mg Oral Daily Pyreddy, Reatha Harps, MD   220 mg at 09/02/18 0848     Discharge Medications: Please see discharge summary for a list of discharge medications.  Relevant Imaging Results:  Relevant Lab Results:   Additional Information SSN [id number]  Zettie Pho, LCSW

## 2018-09-02 NOTE — Plan of Care (Signed)
  Problem: Education: Goal: Knowledge of General Education information will improve Description Including pain rating scale, medication(s)/side effects and non-pharmacologic comfort measures Outcome: Not Progressing Note:  Patient disoriented, seems to be exhibiting some dementia. Patient re-oriented often. Will continue to monitor neurological status. Wenda Low [facility]

## 2018-09-02 NOTE — Progress Notes (Signed)
[place] at [facility]                                                                                                                                                                                  Patient Demographics   Charles Nelson, is a 85 y.o. male, DOB - 27-Mar-1930, TIW:[id number]  Admit date - 08/31/2018   Admitting Physician Saundra Shelling, MD  Outpatient Primary MD for the patient is Lorelee Market, MD   LOS - 1  Subjective: Patient states he is feeling fine this morning.  No chest pain, shortness of breath, palpitations.  He has no concerns.  His niece is very concerned about a lesion on his left cheek. The lesion has been there for ~1 month.    Review of Systems:   CONSTITUTIONAL:  Limited due to dementia, but denies chest pain, SOB, palpitations.   Vitals:   Vitals:   09/01/18 2018 09/02/18 0343 09/02/18 0735 09/02/18 1539  BP: 136/82 (!) 145/83 (!) 146/95 (!) 153/75  Pulse: 69 62 63 64  Resp: 20 20    Temp: 98 F (36.7 C) 98.1 F (36.7 C) (!) 97.5 F (36.4 C) 98.6 F (37 C)  TempSrc: Oral Oral Oral Oral  SpO2: 96% 96% 99% 98%  Weight:      Height:        Wt Readings from Last 3 Encounters:  09/01/18 78.3 kg  08/29/18 88.9 kg  08/23/18 81.6 kg     Intake/Output Summary (Last 24 hours) at 09/02/2018 1718 Last data filed at 09/02/2018 1338 Gross per 24 hour  Intake 840 ml  Output 800 ml  Net 40 ml    Physical Exam:   GENERAL: Chronically ill-appearing HEENT: Atraumatic, normocephalic. Extraocular muscles are intact. Pupils equal and reactive to light. Sclerae anicteric. No conjunctival injection. No oro-pharyngeal erythema. +large purple lesion present on the left cheek, no surrounding erythema or drainage. NECK: Supple. There is no jugular venous distention. No bruits, no lymphadenopathy, no thyromegaly.  HEART: Regular rate and rhythm, No murmurs, no rubs, no clicks.  LUNGS: Clear to auscultation bilaterally. No  rales or rhonchi. No wheezes.  ABDOMEN: Soft, flat, nontender, nondistended. Has good bowel sounds. No hepatosplenomegaly appreciated.  EXTREMITIES: No evidence of any cyanosis, clubbing, or peripheral edema.  +2 pedal and radial pulses bilaterally.  NEUROLOGIC: Confused, moving all extremities. SKIN: Moist and warm with no rashes appreciated. Face lesion as described above. Psych: Not anxious, depressed   Antibiotics   Anti-infectives (From admission, onward)   None      Medications   Scheduled Meds: . apixaban  5 mg Oral BID  . cholecalciferol  2,000 Units Oral  Daily  . Melatonin  10 mg Oral QHS  . multivitamin with minerals  1 tablet Oral Daily  . nystatin ointment   Topical BID  . pantoprazole  40 mg Oral Daily  . polyethylene glycol  17 g Oral Daily  . pravastatin  40 mg Oral QHS  . risperiDONE  2 mg Oral QHS  . tiZANidine  4 mg Oral TID  . vitamin B-12  2,000 mcg Oral Daily  . vitamin C  500 mg Oral BID  . zinc sulfate  220 mg Oral Daily   Continuous Infusions: PRN Meds:.acetaminophen **OR** acetaminophen, docusate sodium, ondansetron **OR** ondansetron (ZOFRAN) IV   Data Review:   Micro Results Recent Results (from the past 240 hour(s))  MRSA PCR Screening     Status: None   Collection Time: 09/01/18  4:19 PM  Result Value Ref Range Status   MRSA by PCR NEGATIVE NEGATIVE Final    Comment:        The GeneXpert MRSA Assay (FDA approved for NASAL specimens only), is one component of a comprehensive MRSA colonization surveillance program. It is not intended to diagnose MRSA infection nor to guide or monitor treatment for MRSA infections. Performed at [facility], [address] [place], [place] [address]     Radiology Reports Dg Chest 1 View  Result Date: 08/22/2018 CLINICAL DATA:  Right chest pain and weakness for 3 days. EXAM: CHEST  1 VIEW COMPARISON:  04/25/2018 FINDINGS: Stable mild cardiomegaly. Aortic atherosclerosis. Low lung volumes  are noted, however both lungs are clear. Right shoulder prosthesis again noted. IMPRESSION: Stable cardiomegaly.  No active lung disease. Electronically Signed   By: Earle Gell M.D.   On: 08/22/2018 18:23   Ct Head Wo Contrast  Result Date: 08/22/2018 CLINICAL DATA:  Patient leaning to the right x2 days with muscle weakness. EXAM: CT HEAD WITHOUT CONTRAST TECHNIQUE: Contiguous axial images were obtained from the base of the skull through the vertex without intravenous contrast. COMPARISON:  07/31/2018 FINDINGS: Brain: Involutional changes brain with chronic mild-to-moderate small vessel ischemia. No acute intracranial hemorrhage, mass or edema. No midline shift. No extra-axial collections midline fourth ventricle and basal cisterns without effacement. Vascular: Moderate atherosclerosis at the skull base. No hyperdense vessel sign Skull: Negative for acute osseous abnormality. Sinuses/Orbits: Ethmoid sinus mucosal thickening. Bilateral cataract extractions. Other: None IMPRESSION: Atrophy with chronic small vessel ischemia. No acute intracranial abnormality. Electronically Signed   By: Ashley Royalty M.D.   On: 08/22/2018 18:14   Dg Chest Port 1 View  Result Date: 08/29/2018 CLINICAL DATA:  Acute onset of immobility. Bilateral lower extremity edema. EXAM: PORTABLE CHEST 1 VIEW COMPARISON:  Chest radiograph performed 08/22/2018 FINDINGS: The lungs are hypoexpanded. Mild bibasilar atelectasis is noted. There is no evidence of pleural effusion or pneumothorax. The cardiomediastinal silhouette is borderline enlarged. No acute osseous abnormalities are seen. The patient's right shoulder arthroplasty is grossly unremarkable in appearance, though incompletely imaged. IMPRESSION: Lungs hypoexpanded, with mild bibasilar atelectasis. Borderline cardiomegaly. Electronically Signed   By: Garald Balding M.D.   On: 08/29/2018 23:28   Ct Renal Stone Study  Result Date: 08/23/2018 CLINICAL DATA:  Bilateral flank pain  EXAM: CT ABDOMEN AND PELVIS WITHOUT CONTRAST TECHNIQUE: Multidetector CT imaging of the abdomen and pelvis was performed following the standard protocol without IV contrast. COMPARISON:  06/07/2017 FINDINGS: Lower chest: Cardiomegaly with aortic atherosclerosis. Three-vessel coronary arteriosclerosis is noted without pericardial effusion or thickening. Faint punctate hyperdensities at the bases, right greater than left may  represent stigmata prior contrast aspiration or potentially microlithiasis. Hepatobiliary: Right hepatic granuloma near the dome. Cholecystectomy. Pancreas: Stable pancreatic body exophytic cyst measuring approximately 1 cm. No ductal dilatation or inflammation. Spleen: Small splenic granulomata. No splenomegaly. Adrenals/Urinary Tract: Nonobstructing left renal pelvic stone measuring approximately 2 mm, series 2/41 an interpolar right renal calculus also nonobstructing. No hydroureteronephrosis. Small extrarenal pelvis is seen left. Mild mural thickening of the anterior bladder dome is nonspecific but may represent stigmata cystitis. The urinary bladder is partially distended. Likely small urachal remnant at the bladder dome is also noted. Normal bilateral adrenal glands. Stomach/Bowel: Decompressed stomach with normal small bowel rotation. Moderate stool retention within the colon. No bowel obstruction. Sigmoid diverticulosis with faint pericolonic mesenteric fatty induration raise the possibility of mild sigmoid diverticulitis. No abscess or bowel obstruction. No free air Vascular/Lymphatic: Aortoiliac and branch vessel atherosclerosis. No aneurysm. Reproductive: Enlarged prostate. Other: Suspect left anterior pelvic fat necrosis near the inguinal canal. Musculoskeletal: New 25% compression fracture of T10 without retropulsion. Biconcave moderate central osteoporotic compression fracture of L2 with 2 mm of retrolisthesis. Stable superior endplate compression of L1. IMPRESSION: 1.  Age-indeterminate but new since prior October, 2018 CT are a 25% compression fracture of T10 without retropulsion and day moderate biconcave osteoporotic appearing compression fracture of L2 with 2 mm of retrolisthesis. Stable superior endplate compression of L1. These findings may be accounting for the patient's flank pain given lack of obstructing calculus seen. Correlate for pain at the T10 and L2 levels. MRI may help for better assessment if clinically warranted. 2. Right punctate nonobstructing interpolar renal calculus with left renal pelvic nonobstructing calculus noted. 3. Sigmoid diverticulosis with faint pericolonic fatty induration suspicious for mild sigmoid diverticulitis. No bowel obstruction or perforation. 4. Aortoiliac and branch vessel atherosclerosis. 5. Stable pancreatic body cyst measuring 1 cm. 6. Mild mural thickening of the anterior bladder dome is nonspecific but may represent stigmata of cystitis. Electronically Signed   By: Ashley Royalty M.D.   On: 08/23/2018 19:27     CBC Recent Labs  Lab 08/29/18 2013 08/31/18 1042 09/01/18 0636  WBC 10.6* 10.8* 11.7*  HGB 12.9* 13.5 11.7*  HCT 41.9 43.3 37.8*  PLT 272 244 215  MCV 97.7 97.7 99.2  MCH 30.1 30.5 30.7  MCHC 30.8 31.2 31.0  RDW 13.6 13.5 13.7  LYMPHSABS 1.3  --   --   MONOABS 0.8  --   --   EOSABS 0.0  --   --   BASOSABS 0.0  --   --     Chemistries  Recent Labs  Lab 08/29/18 2013 08/31/18 1042 09/01/18 0636 09/02/18 0416  NA 139 140 140 140  K 3.2* 3.3* 4.1 3.8  CL 105 105 108 108  CO2 26 26 27 27   GLUCOSE 127* 170* 112* 110*  BUN 22 18 18 19   CREATININE 1.11 0.84 0.78 0.81  CALCIUM 8.7* 8.6* 8.2* 8.2*  MG  --  2.4  --   --   AST 37  --   --   --   ALT 25  --   --   --   ALKPHOS 125  --   --   --   BILITOT 0.6  --   --   --    ------------------------------------------------------------------------------------------------------------------ estimated creatinine clearance is 71.9 mL/min (by C-G  formula based on SCr of 0.81 mg/dL). ------------------------------------------------------------------------------------------------------------------ No results for input(s): HGBA1C in the last 72 hours. ------------------------------------------------------------------------------------------------------------------ No results for input(s): CHOL, HDL, LDLCALC, TRIG, CHOLHDL, LDLDIRECT  in the last 72 hours. ------------------------------------------------------------------------------------------------------------------ No results for input(s): TSH, T4TOTAL, T3FREE, THYROIDAB in the last 72 hours.  Invalid input(s): FREET3 ------------------------------------------------------------------------------------------------------------------ No results for input(s): VITAMINB12, FOLATE, FERRITIN, TIBC, IRON, RETICCTPCT in the last 72 hours.  Coagulation profile No results for input(s): INR, PROTIME in the last 168 hours.  No results for input(s): DDIMER in the last 72 hours.  Cardiac Enzymes Recent Labs  Lab 08/31/18 2344 09/01/18 0636 09/01/18 1407  TROPONINI 0.04* 0.04* 0.03*   ------------------------------------------------------------------------------------------------------------------ Invalid input(s): POCBNP    Assessment & Plan   85 year old elderly male patient with history of dementia, DVT on Eliquis, neuropathy, insomnia presented to emergency room for weakness  Generalized weakness- may be due to deconditioning. Niece concerned that he keeps falling to the right. Frequent admissions for falls. -Having frequent PACs, but seen by cardiology, who do not feel this is related to his weakness -Will check CT head, may need to ultimately obtain MRI brain to rule out stroke -Add CK to morning labs to rule out a myopathy  Dementia- stable, at mental status baseline -Supportive care -Continue Risperdal  Hypotension- resolved, patient is now with mildly elevated BPs -Will  restart lasix on discharge  History of bilateral DVT and PE- no signs of new blood clot -Continue eliquis  Hyperlipidemia- stable -Continue Pravachol      Code Status Orders  (From admission, onward)         Start     Ordered   08/31/18 2324  Do not attempt resuscitation (DNR)  Continuous    Question Answer Comment  In the event of cardiac or respiratory ARREST Do not call a "code blue"   In the event of cardiac or respiratory ARREST Do not perform Intubation, CPR, defibrillation or ACLS   In the event of cardiac or respiratory ARREST Use medication by any route, position, wound care, and other measures to relive pain and suffering. May use oxygen, suction and manual treatment of airway obstruction as needed for comfort.      08/31/18 2323        Code Status History    Date Active Date Inactive Code Status Order ID Comments User Context   12/31/2017 1038 01/03/2018 1709 DNR [id number]  Demetrios Loll, MD Inpatient   06/27/2017 2132 06/30/2017 2147 Full Code [id number]  Hillary Bow, MD ED   08/31/2016 1902 09/03/2016 1701 Full Code [id number]  Henreitta Leber, MD Inpatient      Consults cardiology  DVT Prophylaxis continue Eliquis  Lab Results  Component Value Date   PLT 215 09/01/2018   Time Spent in minutes   50 minutes spent  Greater than 50% of time spent in care coordination and counseling patient regarding the condition and plan of care.   Berna Spare Khaila Velarde M.D on 09/02/2018 at 5:18 PM  Between 7am to 6pm - Pager - [phone]  After 6pm go to [email or web address] - Proofreader  [employer]   Office  [phone]

## 2018-09-03 ENCOUNTER — Inpatient Hospital Stay: Payer: Medicare Other

## 2018-09-03 LAB — BASIC METABOLIC PANEL
Anion gap: 6 (ref 5–15)
BUN: 16 mg/dL (ref 8–23)
CO2: 25 mmol/L (ref 22–32)
CREATININE: 0.73 mg/dL (ref 0.61–1.24)
Calcium: 8.2 mg/dL — ABNORMAL LOW (ref 8.9–10.3)
Chloride: 106 mmol/L (ref 98–111)
GFR calc Af Amer: >60 mL/min (ref >60)
GFR calc non Af Amer: >60 mL/min (ref >60)
Glucose, Bld: 115 mg/dL — ABNORMAL HIGH (ref 70–99)
Potassium: 3.8 mmol/L (ref 3.5–5.1)
Sodium: 137 mmol/L (ref 135–145)

## 2018-09-03 LAB — CBC
HCT: 36.6 % — ABNORMAL LOW (ref 39.0–52.0)
Hemoglobin: 11.3 g/dL — ABNORMAL LOW (ref 13.0–17.0)
MCH: 30.4 pg (ref 26.0–34.0)
MCHC: 30.9 g/dL (ref 30.0–36.0)
MCV: 98.4 fL (ref 80.0–100.0)
Platelets: 218 10*3/uL (ref 150–400)
RBC: 3.72 MIL/uL — ABNORMAL LOW (ref 4.22–5.81)
RDW: 13.3 % (ref 11.5–15.5)
WBC: 9.8 10*3/uL (ref 4.0–10.5)
nRBC: 0 % (ref 0.0–0.2)

## 2018-09-03 LAB — CK: Total CK: 50 U/L (ref 49–397)

## 2018-09-03 NOTE — Progress Notes (Addendum)
[place] at [facility]                                                                                                                                                                                  Patient Demographics   Charles Nelson, is a 85 y.o. male, DOB - [birth year], WUJ:[id number]  Admit date - 08/31/2018   Admitting Physician Saundra Shelling, MD  Outpatient Primary MD for the patient is Lorelee Market, MD   LOS - 2  Subjective: Without any concerns this morning.  He denies any numbness, tingling, weakness of his arms or legs.  No chest pain or shortness of breath.  Review of Systems:   CONSTITUTIONAL:  Limited due to dementia, but denies chest pain, SOB, palpitations.   Vitals:   Vitals:   09/02/18 1539 09/02/18 1941 09/03/18 0411 09/03/18 0934  BP: (!) 153/75 (!) 147/73 (!) 155/76 (!) 148/80  Pulse: 64 74 63 65  Resp:   18   Temp: 98.6 F (37 C) 98.3 F (36.8 C) 98.2 F (36.8 C) 98.7 F (37.1 C)  TempSrc: Oral Oral Oral Oral  SpO2: 98% 99% 99% 100%  Weight:      Height:        Wt Readings from Last 3 Encounters:  09/01/18 78.3 kg  08/29/18 88.9 kg  08/23/18 81.6 kg     Intake/Output Summary (Last 24 hours) at 09/03/2018 1424 Last data filed at 09/02/2018 1838 Gross per 24 hour  Intake 360 ml  Output -  Net 360 ml    Physical Exam:   GENERAL: Chronically ill-appearing HEENT: Atraumatic, normocephalic. Extraocular muscles are intact. Pupils equal and reactive to light. Sclerae anicteric. No conjunctival injection. No oro-pharyngeal erythema. +large purple lesion present on the left cheek, no surrounding erythema or drainage. NECK: Supple. There is no jugular venous distention. No bruits, no lymphadenopathy, no thyromegaly.  HEART: Regular rate and rhythm, No murmurs, no rubs, no clicks.  LUNGS: Clear to auscultation bilaterally. No rales or rhonchi. No wheezes.  ABDOMEN: Soft, flat, nontender, nondistended. Has good bowel  sounds. No hepatosplenomegaly appreciated.  EXTREMITIES: No evidence of any cyanosis, clubbing, or peripheral edema.  +2 pedal and radial pulses bilaterally.  NEUROLOGIC: Confused, able to move all extremities. SKIN: Moist and warm with no rashes appreciated. Face lesion as described above. Psych: Not anxious, depressed   Antibiotics   Anti-infectives (From admission, onward)   None      Medications   Scheduled Meds: . apixaban  5 mg Oral BID  . cholecalciferol  2,000 Units Oral Daily  . Melatonin  10 mg Oral QHS  . multivitamin with minerals  1 tablet Oral  Daily  . nystatin ointment   Topical BID  . pantoprazole  40 mg Oral Daily  . polyethylene glycol  17 g Oral Daily  . pravastatin  40 mg Oral QHS  . risperiDONE  2 mg Oral QHS  . tiZANidine  4 mg Oral TID  . vitamin B-12  2,000 mcg Oral Daily  . vitamin C  500 mg Oral BID  . zinc sulfate  220 mg Oral Daily   Continuous Infusions: PRN Meds:.acetaminophen **OR** acetaminophen, docusate sodium, ondansetron **OR** ondansetron (ZOFRAN) IV   Data Review:   Micro Results Recent Results (from the past 240 hour(s))  MRSA PCR Screening     Status: None   Collection Time: 09/01/18  4:19 PM  Result Value Ref Range Status   MRSA by PCR NEGATIVE NEGATIVE Final    Comment:        The GeneXpert MRSA Assay (FDA approved for NASAL specimens only), is one component of a comprehensive MRSA colonization surveillance program. It is not intended to diagnose MRSA infection nor to guide or monitor treatment for MRSA infections. Performed at [facility], [address] [place], [place] [address]     Radiology Reports Dg Chest 1 View  Result Date: 08/22/2018 CLINICAL DATA:  Right chest pain and weakness for 3 days. EXAM: CHEST  1 VIEW COMPARISON:  04/25/2018 FINDINGS: Stable mild cardiomegaly. Aortic atherosclerosis. Low lung volumes are noted, however both lungs are clear. Right shoulder prosthesis again noted.  IMPRESSION: Stable cardiomegaly.  No active lung disease. Electronically Signed   By: Earle Gell M.D.   On: 08/22/2018 18:23   Ct Head Wo Contrast  Result Date: 09/02/2018 CLINICAL DATA:  Pt unable to answer any questions in CT. 85yo male who comes to [facility] on 1[facility]d/t generalized weakness. 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 PTA pt lived in a family care home." EXAM: CT HEAD WITHOUT CONTRAST TECHNIQUE: Contiguous axial images were obtained from the base of the skull through the vertex without intravenous contrast. COMPARISON:  08/22/2018 FINDINGS: Brain: No evidence of acute infarction, hemorrhage, hydrocephalus, extra-axial collection or mass lesion/mass effect. There is ventricular and sulcal enlargement reflecting mild generalized atrophy, as well as mild patchy periventricular white matter hypoattenuation consistent with chronic microvascular ischemic change. These findings are stable. Vascular: No hyperdense vessel or unexpected calcification. Skull: Normal. Negative for fracture or focal lesion. Sinuses/Orbits: Visualize globes and orbits are unremarkable. Mild ethmoid sinus mucosal thickening. Remaining visualized sinuses are clear. Other: None. IMPRESSION: 1. No acute intracranial abnormalities. 2. Mild atrophy and chronic microvascular ischemic change. Electronically Signed   By: Lajean Manes M.D.   On: 09/02/2018 17:36   Ct Head Wo Contrast  Result Date: 08/22/2018 CLINICAL DATA:  Patient leaning to the right x2 days with muscle weakness. EXAM: CT HEAD WITHOUT CONTRAST TECHNIQUE: Contiguous axial images were obtained from the base of the skull through the vertex without intravenous contrast. COMPARISON:  07/31/2018 FINDINGS: Brain: Involutional changes brain with chronic mild-to-moderate small vessel ischemia. No acute intracranial hemorrhage, mass or edema. No midline shift. No extra-axial collections midline fourth ventricle and basal cisterns without effacement. Vascular:  Moderate atherosclerosis at the skull base. No hyperdense vessel sign Skull: Negative for acute osseous abnormality. Sinuses/Orbits: Ethmoid sinus mucosal thickening. Bilateral cataract extractions. Other: None IMPRESSION: Atrophy with chronic small vessel ischemia. No acute intracranial abnormality. Electronically Signed   By: Ashley Royalty M.D.   On: 08/22/2018 18:14   Mr Brain Wo Contrast  Result Date:  09/03/2018 CLINICAL DATA:  Recent development of generalized weakness. Dementia. Falling. EXAM: MRI HEAD WITHOUT CONTRAST TECHNIQUE: Multiplanar, multiecho pulse sequences of the brain and surrounding structures were obtained without intravenous contrast. COMPARISON:  CT 09/02/2018 FINDINGS: Brain: Diffusion imaging does not show any acute or subacute infarction. Brainstem and cerebellum are normal. Cerebral hemispheres show atrophy with moderate chronic small-vessel ischemic changes of the deep white matter. Old lacunar infarction lateral right thalamus. No large vessel territory infarction. No mass lesion, hemorrhage, hydrocephalus or extra-axial collection. Vascular: Major vessels at the base of the brain show flow. Skull and upper cervical spine: Negative Sinuses/Orbits: Clear/normal Other: None IMPRESSION: No acute finding. Age related atrophy. Chronic small-vessel ischemic changes as outlined above. Electronically Signed   By: Nelson Chimes M.D.   On: 09/03/2018 10:45   Dg Chest Port 1 View  Result Date: 08/29/2018 CLINICAL DATA:  Acute onset of immobility. Bilateral lower extremity edema. EXAM: PORTABLE CHEST 1 VIEW COMPARISON:  Chest radiograph performed 08/22/2018 FINDINGS: The lungs are hypoexpanded. Mild bibasilar atelectasis is noted. There is no evidence of pleural effusion or pneumothorax. The cardiomediastinal silhouette is borderline enlarged. No acute osseous abnormalities are seen. The patient's right shoulder arthroplasty is grossly unremarkable in appearance, though incompletely imaged.  IMPRESSION: Lungs hypoexpanded, with mild bibasilar atelectasis. Borderline cardiomegaly. Electronically Signed   By: Garald Balding M.D.   On: 08/29/2018 23:28   Ct Renal Stone Study  Result Date: 08/23/2018 CLINICAL DATA:  Bilateral flank pain EXAM: CT ABDOMEN AND PELVIS WITHOUT CONTRAST TECHNIQUE: Multidetector CT imaging of the abdomen and pelvis was performed following the standard protocol without IV contrast. COMPARISON:  06/07/2017 FINDINGS: Lower chest: Cardiomegaly with aortic atherosclerosis. Three-vessel coronary arteriosclerosis is noted without pericardial effusion or thickening. Faint punctate hyperdensities at the bases, right greater than left may represent stigmata prior contrast aspiration or potentially microlithiasis. Hepatobiliary: Right hepatic granuloma near the dome. Cholecystectomy. Pancreas: Stable pancreatic body exophytic cyst measuring approximately 1 cm. No ductal dilatation or inflammation. Spleen: Small splenic granulomata. No splenomegaly. Adrenals/Urinary Tract: Nonobstructing left renal pelvic stone measuring approximately 2 mm, series 2/41 an interpolar right renal calculus also nonobstructing. No hydroureteronephrosis. Small extrarenal pelvis is seen left. Mild mural thickening of the anterior bladder dome is nonspecific but may represent stigmata cystitis. The urinary bladder is partially distended. Likely small urachal remnant at the bladder dome is also noted. Normal bilateral adrenal glands. Stomach/Bowel: Decompressed stomach with normal small bowel rotation. Moderate stool retention within the colon. No bowel obstruction. Sigmoid diverticulosis with faint pericolonic mesenteric fatty induration raise the possibility of mild sigmoid diverticulitis. No abscess or bowel obstruction. No free air Vascular/Lymphatic: Aortoiliac and branch vessel atherosclerosis. No aneurysm. Reproductive: Enlarged prostate. Other: Suspect left anterior pelvic fat necrosis near the inguinal  canal. Musculoskeletal: New 25% compression fracture of T10 without retropulsion. Biconcave moderate central osteoporotic compression fracture of L2 with 2 mm of retrolisthesis. Stable superior endplate compression of L1. IMPRESSION: 1. Age-indeterminate but new since prior October, 2018 CT are a 25% compression fracture of T10 without retropulsion and day moderate biconcave osteoporotic appearing compression fracture of L2 with 2 mm of retrolisthesis. Stable superior endplate compression of L1. These findings may be accounting for the patient's flank pain given lack of obstructing calculus seen. Correlate for pain at the T10 and L2 levels. MRI may help for better assessment if clinically warranted. 2. Right punctate nonobstructing interpolar renal calculus with left renal pelvic nonobstructing calculus noted. 3. Sigmoid diverticulosis with faint pericolonic fatty induration suspicious for mild  sigmoid diverticulitis. No bowel obstruction or perforation. 4. Aortoiliac and branch vessel atherosclerosis. 5. Stable pancreatic body cyst measuring 1 cm. 6. Mild mural thickening of the anterior bladder dome is nonspecific but may represent stigmata of cystitis. Electronically Signed   By: Ashley Royalty M.D.   On: 08/23/2018 19:27     CBC Recent Labs  Lab 08/29/18 2013 08/31/18 1042 09/01/18 0636 09/03/18 1101  WBC 10.6* 10.8* 11.7* 9.8  HGB 12.9* 13.5 11.7* 11.3*  HCT 41.9 43.3 37.8* 36.6*  PLT 272 244 215 218  MCV 97.7 97.7 99.2 98.4  MCH 30.1 30.5 30.7 30.4  MCHC 30.8 31.2 31.0 30.9  RDW 13.6 13.5 13.7 13.3  LYMPHSABS 1.3  --   --   --   MONOABS 0.8  --   --   --   EOSABS 0.0  --   --   --   BASOSABS 0.0  --   --   --     Chemistries  Recent Labs  Lab 08/29/18 2013 08/31/18 1042 09/01/18 0636 09/02/18 0416 09/03/18 1101  NA 139 140 140 140 137  K 3.2* 3.3* 4.1 3.8 3.8  CL 105 105 108 108 106  CO2 26 26 27 27 25   GLUCOSE 127* 170* 112* 110* 115*  BUN 22 18 18 19 16   CREATININE 1.11  0.84 0.78 0.81 0.73  CALCIUM 8.7* 8.6* 8.2* 8.2* 8.2*  MG  --  2.4  --   --   --   AST 37  --   --   --   --   ALT 25  --   --   --   --   ALKPHOS 125  --   --   --   --   BILITOT 0.6  --   --   --   --    ------------------------------------------------------------------------------------------------------------------ estimated creatinine clearance is 72.8 mL/min (by C-G formula based on SCr of 0.73 mg/dL). ------------------------------------------------------------------------------------------------------------------ No results for input(s): HGBA1C in the last 72 hours. ------------------------------------------------------------------------------------------------------------------ No results for input(s): CHOL, HDL, LDLCALC, TRIG, CHOLHDL, LDLDIRECT in the last 72 hours. ------------------------------------------------------------------------------------------------------------------ No results for input(s): TSH, T4TOTAL, T3FREE, THYROIDAB in the last 72 hours.  Invalid input(s): FREET3 ------------------------------------------------------------------------------------------------------------------ No results for input(s): VITAMINB12, FOLATE, FERRITIN, TIBC, IRON, RETICCTPCT in the last 72 hours.  Coagulation profile No results for input(s): INR, PROTIME in the last 168 hours.  No results for input(s): DDIMER in the last 72 hours.  Cardiac Enzymes Recent Labs  Lab 08/31/18 2344 09/01/18 0636 09/01/18 1407  TROPONINI 0.04* 0.04* 0.03*   ------------------------------------------------------------------------------------------------------------------ Invalid input(s): POCBNP    Assessment & Plan   85 year old elderly male patient with history of dementia, DVT on Eliquis, neuropathy, insomnia presented to emergency room for weakness  Generalized weakness- may be due to deconditioning. Niece concerned that he keeps falling to the right. Frequent admissions for  falls. -Having frequent PACs, but seen by cardiology, who do not feel this is related to his weakness -CT head negative -Will order MRI brain to rule out stroke -Check vitamin B12, vitamin D to rule out other causes  Left face lesion- concerned that this may be a basal cell carcinoma -Needs dermatology follow-up on discharge  Dementia- stable, at mental status baseline -Supportive care -Continue Risperdal -Fall precautions  Hypertension- BPs mostly well-controlled -Will restart lasix on discharge  History of bilateral DVT and PE- no signs of new blood clot -Continue eliquis  Hyperlipidemia- stable -Continue Pravachol      Code Status Orders  (  From admission, onward)         Start     Ordered   08/31/18 2324  Do not attempt resuscitation (DNR)  Continuous    Question Answer Comment  In the event of cardiac or respiratory ARREST Do not call a "code blue"   In the event of cardiac or respiratory ARREST Do not perform Intubation, CPR, defibrillation or ACLS   In the event of cardiac or respiratory ARREST Use medication by any route, position, wound care, and other measures to relive pain and suffering. May use oxygen, suction and manual treatment of airway obstruction as needed for comfort.      08/31/18 2323        Code Status History    Date Active Date Inactive Code Status Order ID Comments User Context   12/31/2017 1038 01/03/2018 1709 DNR [id number]  Demetrios Loll, MD Inpatient   06/27/2017 2132 06/30/2017 2147 Full Code [id number]  Hillary Bow, MD ED   08/31/2016 1902 09/03/2016 1701 Full Code [id number]  Henreitta Leber, MD Inpatient      Consults cardiology  DVT Prophylaxis continue Eliquis  Lab Results  Component Value Date   PLT 218 09/03/2018   Time Spent in minutes   50 minutes spent  Greater than 50% of time spent in care coordination and counseling patient regarding the condition and plan of care.   Berna Spare Mayo M.D on 09/03/2018 at 2:24 PM  Between 7am  to 6pm - Pager [phone]  After 6pm go to [email or web address] - Proofreader  [employer]   Office  [phone]

## 2018-09-03 NOTE — Progress Notes (Signed)
Pt placed on low bed, with floor mats in place at this time, he is easily reoriented however a high fall risk, with a room near nurses station. Will continue to monitor for need of telesitter.

## 2018-09-03 NOTE — Progress Notes (Signed)
Patient pulled PIV out. Called to restart PIV.Patient only receiving IV Zofran PRN but also has order for PO Zofran.PIV not started at this time. Primary RN will notify IV team if PIV needed.

## 2018-09-04 LAB — CBC
HCT: 40.5 % (ref 39.0–52.0)
Hemoglobin: 12.8 g/dL — ABNORMAL LOW (ref 13.0–17.0)
MCH: 31.1 pg (ref 26.0–34.0)
MCHC: 31.6 g/dL (ref 30.0–36.0)
MCV: 98.5 fL (ref 80.0–100.0)
Platelets: 246 10*3/uL (ref 150–400)
RBC: 4.11 MIL/uL — ABNORMAL LOW (ref 4.22–5.81)
RDW: 13.3 % (ref 11.5–15.5)
WBC: 10.4 10*3/uL (ref 4.0–10.5)
nRBC: 0 % (ref 0.0–0.2)

## 2018-09-04 LAB — BASIC METABOLIC PANEL
ANION GAP: 6 (ref 5–15)
BUN: 19 mg/dL (ref 8–23)
CALCIUM: 8.7 mg/dL — AB (ref 8.9–10.3)
CO2: 28 mmol/L (ref 22–32)
CREATININE: 0.79 mg/dL (ref 0.61–1.24)
Chloride: 105 mmol/L (ref 98–111)
GFR calc non Af Amer: >60 mL/min (ref >60)
Glucose, Bld: 107 mg/dL — ABNORMAL HIGH (ref 70–99)
Potassium: 4.1 mmol/L (ref 3.5–5.1)
Sodium: 139 mmol/L (ref 135–145)

## 2018-09-04 LAB — PHOSPHORUS: Phosphorus: 3.1 mg/dL (ref 2.5–4.6)

## 2018-09-04 LAB — MAGNESIUM: Magnesium: 2.3 mg/dL (ref 1.7–2.4)

## 2018-09-04 LAB — VITAMIN B12: Vitamin B-12: 2382 pg/mL — ABNORMAL HIGH (ref 180–914)

## 2018-09-04 NOTE — Progress Notes (Signed)
Report called to [facility], spoke to [facility], EMS notified for transport.

## 2018-09-04 NOTE — Progress Notes (Signed)
Pt discharged to [facility], via EMS. No complaints at this time AVS printed and placed in envelope with EMS to facility. VSS.

## 2018-09-04 NOTE — Discharge Summary (Signed)
[facility] [place] NAME: Charles Nelson    MR#:  [id number]  DATE OF BIRTH:  09/25/1929  DATE OF ADMISSION:  08/31/2018   ADMITTING PHYSICIAN: Saundra Shelling, MD  DATE OF DISCHARGE: 09/04/18  PRIMARY CARE PHYSICIAN: Lorelee Market, MD   ADMISSION DIAGNOSIS:  Other specified hypotension [I95.89] Hypokalemia [E87.6] Premature atrial contractions [I49.1] Atrial bigeminy [I49.8] Hypotension, unspecified hypotension type [I95.9] Bradycardia, unspecified [R00.1] Atrial arrhythmia [I49.8] DISCHARGE DIAGNOSIS:  Active Problems:   Leg swelling   Essential hypertension   Atrial arrhythmia   Weakness   Falls  SECONDARY DIAGNOSIS:   Past Medical History:  Diagnosis Date  . DVT (deep venous thrombosis) ([place])   . Insomnia   . Memory loss   . Neuropathy    HOSPITAL COURSE:   Charles Nelson is an 85 year old male who presented to the ED with generalized weakness. In the ED, his blood pressures were soft. He was admitted for further management.  Generalized weakness- likely due to deconditioning.  -Having frequent PACs/ectopy, but seen by cardiology, who did not feel this is related to his weakness -CT head negative -MRI brain with old lacunar infarct, but no deficits  Left face lesion- has been present for the last month, per niece.  -Concern that this may be a skin cancer -Needs dermatology follow-up on discharge  Dementia- stable, at mental status baseline -Supportive care -Continued Risperdal  Hypertension- mostly normotensive this admission -Will restart lasix on discharge  History of bilateral DVT and PE- no signs of new blood clot -Continued eliquis  Hyperlipidemia- stable -Continued Pravachol  DISCHARGE CONDITIONS:  Generalized weakness Ectopy/PACs Left face lesion Dementia Hypertension History of bilateral DVT/PE Hyperlipidemia CONSULTS OBTAINED:  Cardiology DRUG ALLERGIES:  No Known Allergies DISCHARGE  MEDICATIONS:   Allergies as of 09/04/2018   No Known Allergies     Medication List    STOP taking these medications   amoxicillin-clavulanate 875-125 MG tablet Commonly known as:  AUGMENTIN     TAKE these medications   acetaminophen 500 MG tablet Commonly known as:  TYLENOL Take 1,000 mg by mouth 3 (three) times daily as needed for mild pain or moderate pain.   cholecalciferol 1000 units tablet Commonly known as:  VITAMIN D Take 2,000 Units by mouth daily.   docusate sodium 100 MG capsule Commonly known as:  COLACE Take 100 mg by mouth 2 (two) times daily as needed for mild constipation.   ELIQUIS 5 MG Tabs tablet Generic drug:  apixaban Take 5 mg by mouth daily.   furosemide 40 MG tablet Commonly known as:  LASIX Take 0.5 tablets (20 mg total) by mouth daily.   Melatonin 10 MG Tabs Take 10 mg by mouth at bedtime.   pantoprazole 40 MG tablet Commonly known as:  PROTONIX Take 1 tablet (40 mg total) by mouth daily.   polyethylene glycol packet Commonly known as:  MIRALAX / GLYCOLAX Take 17 g by mouth daily as needed for mild constipation. What changed:  when to take this   pravastatin 40 MG tablet Commonly known as:  PRAVACHOL Take 40 mg by mouth at bedtime.   risperiDONE 2 MG tablet Commonly known as:  RISPERDAL Take 2 mg by mouth at bedtime.   THERAVIM-M Tabs Take 1 tablet by mouth daily.   tiZANidine 4 MG capsule Commonly known as:  ZANAFLEX Take 4 mg by mouth 3 (three) times daily.   vitamin B-12 1000 MCG tablet Commonly known as:  CYANOCOBALAMIN Take  2,000 mcg by mouth daily.   vitamin C 500 MG tablet Commonly known as:  ASCORBIC ACID Take 500 mg by mouth 2 (two) times daily.   zinc sulfate 220 (50 Zn) MG capsule Take 220 mg by mouth daily.        DISCHARGE INSTRUCTIONS:  1. F/u with PCP in 5 days 2. F/u with dermatology in 1-2 weeks DIET:  Regular diet DISCHARGE CONDITION:  Stable ACTIVITY:  Activity as tolerated OXYGEN:  Home  Oxygen: No.  Oxygen Delivery: room air DISCHARGE LOCATION:  nursing home   If you experience worsening of your admission symptoms, develop shortness of breath, life threatening emergency, suicidal or homicidal thoughts you must seek medical attention immediately by calling 911 or calling your MD immediately  if symptoms less severe.  You Must read complete instructions/literature along with all the possible adverse reactions/side effects for all the Medicines you take and that have been prescribed to you. Take any new Medicines after you have completely understood and accpet all the possible adverse reactions/side effects.   Please note  You were cared for by a hospitalist during your hospital stay. If you have any questions about your discharge medications or the care you received while you were in the hospital after you are discharged, you can call the unit and asked to speak with the hospitalist on call if the hospitalist that took care of you is not available. Once you are discharged, your primary care physician will handle any further medical issues. Please note that NO REFILLS for any discharge medications will be authorized once you are discharged, as it is imperative that you return to your primary care physician (or establish a relationship with a primary care physician if you do not have one) for your aftercare needs so that they can reassess your need for medications and monitor your lab values.    On the day of Discharge:  VITAL SIGNS:  Blood pressure (!) 145/75, pulse (!) 59, temperature (!) 97.5 F (36.4 C), temperature source Oral, resp. rate 18, height 6' (1.829 m), weight 78.3 kg, SpO2 96 %. PHYSICAL EXAMINATION:  GENERAL: Chronically ill-appearing HEENT: Atraumatic, normocephalic. Extraocular muscles are intact. Pupils equal and reactive to light. Sclerae anicteric. No conjunctival injection. No oro-pharyngeal erythema. +large purple lesion present on the left cheek, no  surrounding erythema or drainage. NECK: Supple. There is no jugular venous distention. No bruits, no lymphadenopathy, no thyromegaly.  HEART: Regular rate and rhythm, No murmurs, no rubs, no clicks.  LUNGS: Clear to auscultation bilaterally. No rales or rhonchi. No wheezes.  ABDOMEN: Soft, flat, nontender, nondistended. Has good bowel sounds. No hepatosplenomegaly appreciated.  EXTREMITIES: No evidence of any cyanosis, clubbing, or peripheral edema.  +2 pedal and radial pulses bilaterally.  NEUROLOGIC: Confused, able to move all extremities. SKIN: Moist and warm with no rashes appreciated. Face lesion as described above. Psych: Not anxious, depressed DATA REVIEW:   CBC Recent Labs  Lab 09/04/18 0542  WBC 10.4  HGB 12.8*  HCT 40.5  PLT 246    Chemistries  Recent Labs  Lab 08/29/18 2013  09/04/18 0542  NA 139   < > 139  K 3.2*   < > 4.1  CL 105   < > 105  CO2 26   < > 28  GLUCOSE 127*   < > 107*  BUN 22   < > 19  CREATININE 1.11   < > 0.79  CALCIUM 8.7*   < > 8.7*  MG  --    < >  2.3  AST 37  --   --   ALT 25  --   --   ALKPHOS 125  --   --   BILITOT 0.6  --   --    < > = values in this interval not displayed.     Microbiology Results  Results for orders placed or performed during the hospital encounter of 08/31/18  MRSA PCR Screening     Status: None   Collection Time: 09/01/18  4:19 PM  Result Value Ref Range Status   MRSA by PCR NEGATIVE NEGATIVE Final    Comment:        The GeneXpert MRSA Assay (FDA approved for NASAL specimens only), is one component of a comprehensive MRSA colonization surveillance program. It is not intended to diagnose MRSA infection nor to guide or monitor treatment for MRSA infections. Performed at [facility], [address] [place], [place] [address]     RADIOLOGY:  Mr Herby Abraham Contrast  Result Date: 09/03/2018 CLINICAL DATA:  Recent development of generalized weakness. Dementia. Falling. EXAM: MRI HEAD WITHOUT  CONTRAST TECHNIQUE: Multiplanar, multiecho pulse sequences of the brain and surrounding structures were obtained without intravenous contrast. COMPARISON:  CT 09/02/2018 FINDINGS: Brain: Diffusion imaging does not show any acute or subacute infarction. Brainstem and cerebellum are normal. Cerebral hemispheres show atrophy with moderate chronic small-vessel ischemic changes of the deep white matter. Old lacunar infarction lateral right thalamus. No large vessel territory infarction. No mass lesion, hemorrhage, hydrocephalus or extra-axial collection. Vascular: Major vessels at the base of the brain show flow. Skull and upper cervical spine: Negative Sinuses/Orbits: Clear/normal Other: None IMPRESSION: No acute finding. Age related atrophy. Chronic small-vessel ischemic changes as outlined above. Electronically Signed   By: Nelson Chimes M.D.   On: 09/03/2018 10:45     Management plans discussed with the patient, family and they are in agreement.  CODE STATUS: DNR   TOTAL TIME TAKING CARE OF THIS PATIENT: 35 minutes.    Berna Spare  M.D on 09/04/2018 at 9:54 AM  Between 7am to 6pm - Pager - [phone]  After 6pm go to [email or web address] - Proofreader  [employer] [facility]  Office  [phone]  CC: Primary care physician; Lorelee Market, MD   Note: This dictation was prepared with Dragon dictation along with smaller phrase technology. Any transcriptional errors that result from this process are unintentional.

## 2018-09-04 NOTE — Clinical Social Work Note (Signed)
Clinical Social Work Assessment  Patient Details  Name: Chrystopher Stangl MRN: [id number] Date of Birth: [birth year]  Date of referral:  09/04/18               Reason for consult:  Facility Placement                Permission sought to share information with:  Facility Sport and exercise psychologist, Guardian Permission granted to share information::  Yes, Verbal Permission Granted  Name::     [place] [phone] or Tammy Other   [phone]   Agency::  SNF admissions  Relationship::     Contact Information:     Housing/Transportation Living arrangements for the past 2 months:  [place] of Information:  Medical Team, Guardian, Other (Comment Required) Patient Interpreter Needed:  None Criminal Activity/Legal Involvement Pertinent to Current Situation/Hospitalization:  No - Comment as needed Significant Relationships:  Other Family Members Lives with:  Facility Resident Do you feel safe going back to the place where you live?  No Need for family participation in patient care:  Yes (Comment)  Care giving concerns:  Patient's group home and his niece feels that patient needs some rehab before he is able to return back to group home.   Social Worker assessment / plan:  Patient is an 85 year old male who is alert and oriented x2.  Patient lives at [facility] family care [facility], with 4 other residents.  Patient has been at family care home for just over a year, and niece has been pleased with the care that he is receiving.  Patient has some dementia, CSW spoke to patient's niece to complete assessment, and the family care home manager.  Patient has been to rehab in the past, patient was at [facility], family does not want him to return there, but she is open to other facilities.  CSW explained how insurance will pay for stay.  Patient's niece gave CSW permission to begin bed search in [place].  Employment status:  Retired Forensic scientist:   Medicare PT Recommendations:  [place] / Referral to community resources:  [place]  Patient/Family's Response to care:  Patient and family are in agreement to going to SNF for short term rehab.  Patient/Family's Understanding of and Emotional Response to Diagnosis, Current Treatment, and Prognosis:  Patient and family are hopeful that he will not have to be in rehab long and can return back to family care home setting.  Emotional Assessment Appearance:  Appears stated age Attitude/Demeanor/Rapport:    Affect (typically observed):  Appropriate, Stable Orientation:  Oriented to Self, Oriented to Situation Alcohol / Substance use:  Not Applicable Psych involvement (Current and /or in the community):  No (Comment)  Discharge Needs  Concerns to be addressed:  Cognitive Concerns, Lack of Support, Care Coordination Readmission within the last 30 days:  Yes Current discharge risk:  Lack of support system, Physical Impairment Barriers to Discharge:  Continued Medical Work up   Anell Barr 09/04/2018, 10:43 AM

## 2018-09-04 NOTE — Discharge Instructions (Signed)
It was so nice to meet you during this hospitalization! You came into the hospital because you were weak. We think this is caused by deconditioning. All of your labs and imaging studies were normal.  You have a lesion on your cheek that may be a skin cancer. Please make sure you see a dermatologist for this when you leave the hospital.  Take care, Dr. Brett Albino

## 2018-09-04 NOTE — Clinical Social Work Note (Signed)
Patient to be d/c'ed today to [facility], room 509.  Patient and family agreeable to plans will transport via ems RN to call report to [phone].  CSW updated patient's niece and family care home manager that patient is discharging today.  Evette Cristal, MSW, [place]

## 2018-09-04 NOTE — Care Management Important Message (Signed)
Copy of signed Medicare IM left with patient in room. 

## 2018-09-04 NOTE — Clinical Social Work Note (Signed)
CSW provided SNF bed offers for therapy, and she chose [facility], [place] contacted [facility] and they can accept patient today if he is medically ready for discharge and orders have been received.  Jones Broom. [place], MSW, [place]  09/04/2018 11:01 AM

## 2018-09-05 LAB — VITAMIN D 25 HYDROXY (VIT D DEFICIENCY, FRACTURES): Vit D, 25-Hydroxy: 31.8 ng/mL (ref 30.0–100.0)

## 2018-09-15 ENCOUNTER — Ambulatory Visit: Payer: Medicare Other | Admitting: Nurse Practitioner

## 2018-09-18 ENCOUNTER — Encounter: Payer: Self-pay | Admitting: Nurse Practitioner

## 2019-03-31 DEATH — deceased

## 2019-12-11 IMAGING — MR MR HEAD W/O CM
9 of 10 series · 43 of 48 positions shown · non-contrast
Comparison: CT 09/02/2018

CLINICAL DATA: Recent development of generalized weakness.
Dementia. Falling.

EXAM:
MRI HEAD WITHOUT CONTRAST
TECHNIQUE: Multiplanar, multiecho pulse sequences of the brain and surrounding
structures were obtained without intravenous contrast.

[Series 2: ax dwi_tracew · axial · 3.0mm · 0.83mm/px · z∈[-57,+104]mm · 7 of 55 slices shown]
[im 1/55]
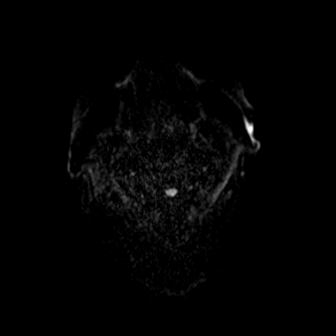
[im 10/55]
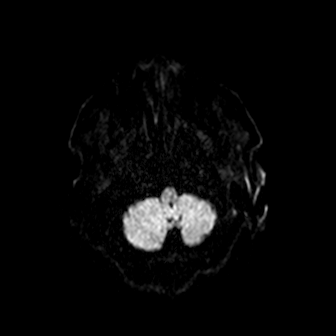
[im 19/55]
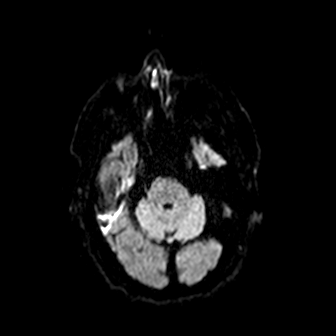
[im 28/55]
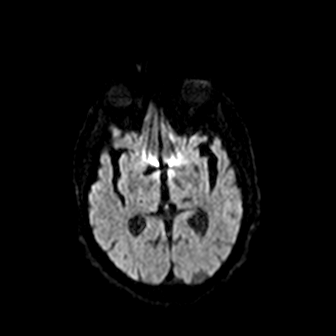
[im 37/55]
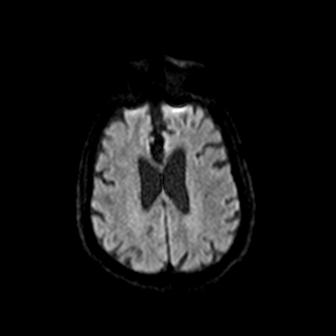
[im 46/55]
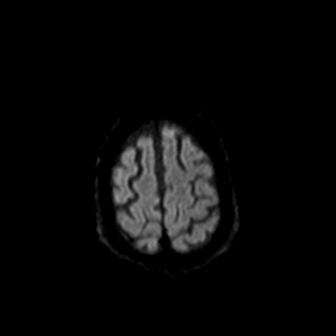
[im 55/55]
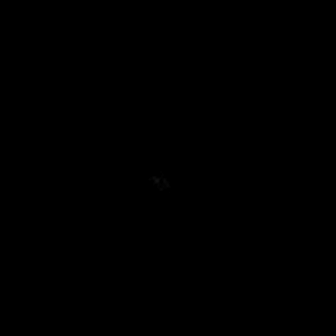

[Series 3: ax dwi_adc · axial · 3.0mm · 0.83mm/px · z∈[-57,+104]mm · 8 of 55 slices shown]
[im 1/55]
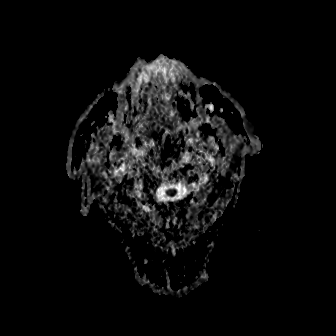
[im 8/55]
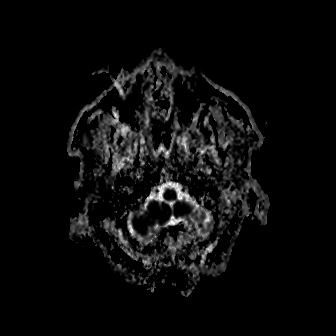
[im 16/55]
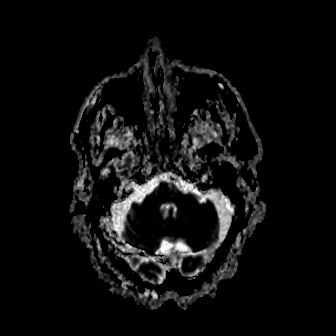
[im 24/55]
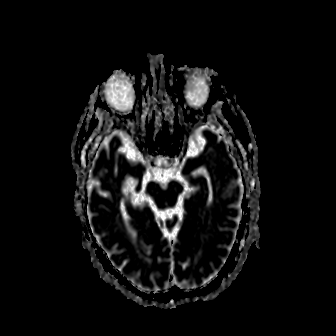
[im 31/55]
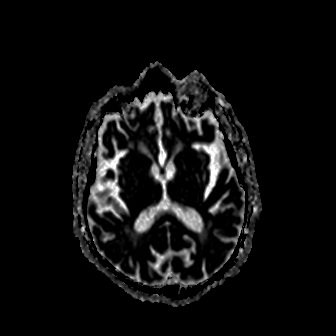
[im 39/55]
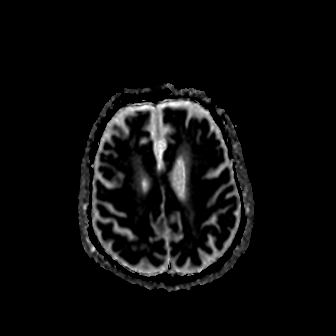
[im 47/55]
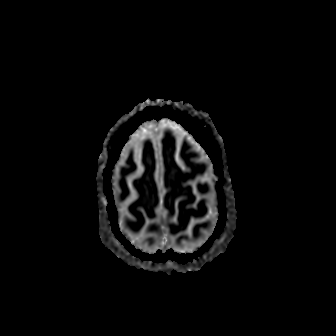
[im 55/55]
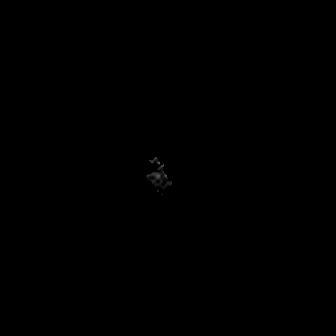

[Series 4: cor dwi_tracew · coronal · 5.0mm · 0.68mm/px · 5 of 38 slices shown]
[im 1/38]
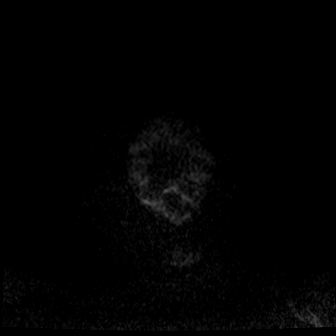
[im 10/38]
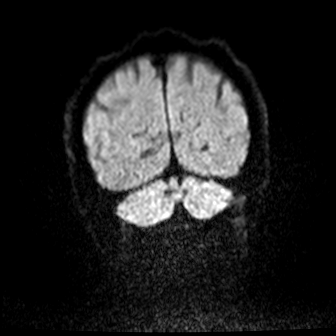
[im 19/38]
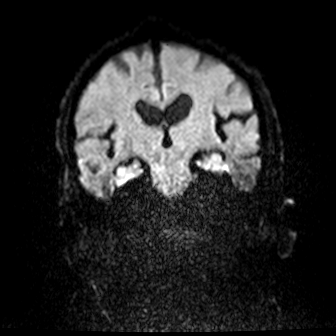
[im 28/38]
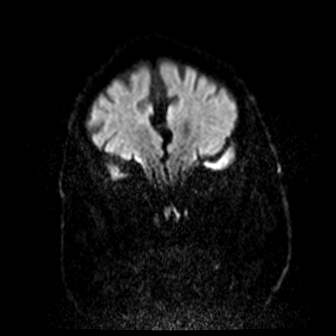
[im 38/38]
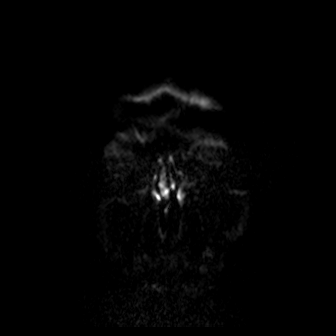

[Series 5: cor dwi_adc · coronal · 5.0mm · 0.68mm/px · 4 of 38 slices shown]
[im 1/38]
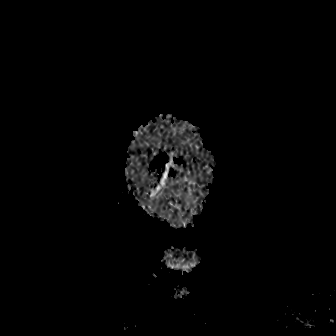
[im 10/38]
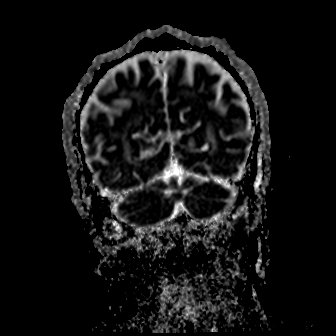
[im 19/38]
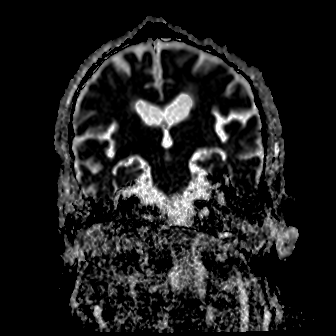
[im 28/38]
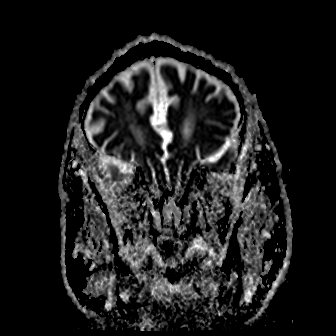

[Series 6: T1 · sagittal · 5.0mm · 0.94mm/px · 3 of 25 slices shown (1 of 2)]
[im 1/25]
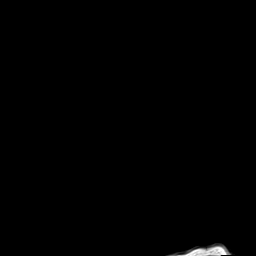
[im 13/25]
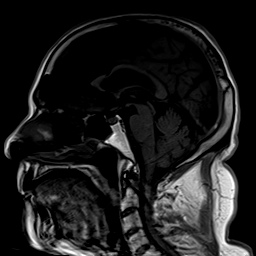
[im 25/25]
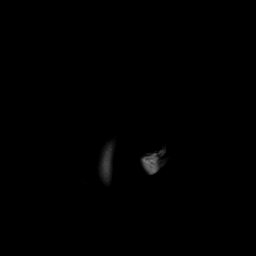

[Series 7: T2 · axial · 5.0mm · 0.45mm/px · z∈[-49,+106]mm · 4 of 27 slices shown (1 of 2)]
[im 1/27]
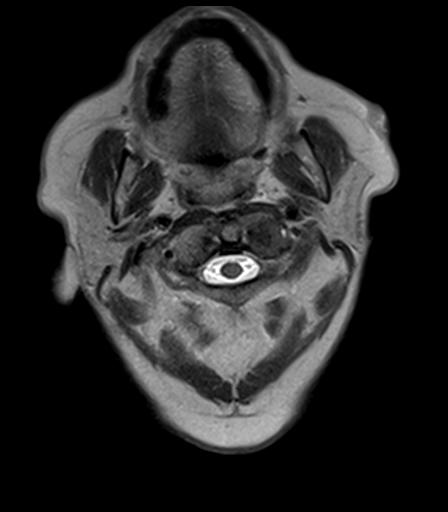
[im 9/27]
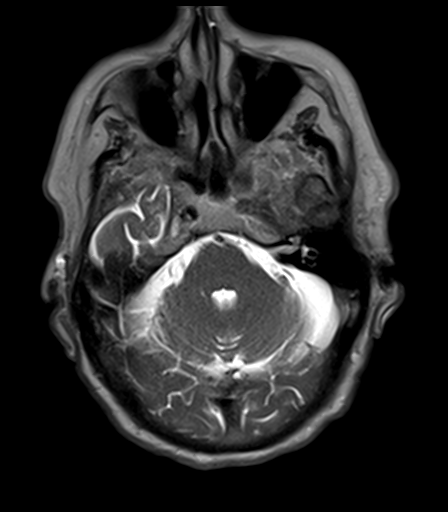
[im 18/27]
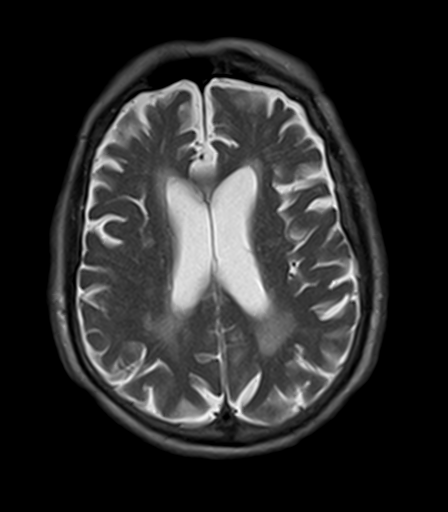
[im 27/27]
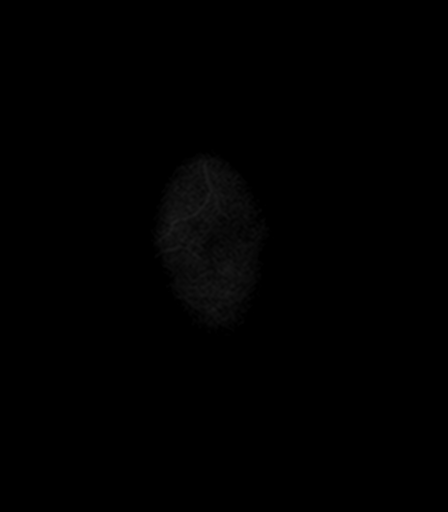

[Series 9: FLAIR · axial · 5.0mm · 1.20mm/px · z∈[-50,+105]mm · 4 of 27 slices shown]
[im 1/27]
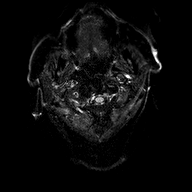
[im 9/27]
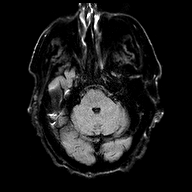
[im 18/27]
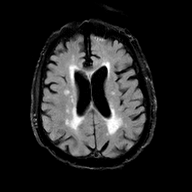
[im 27/27]
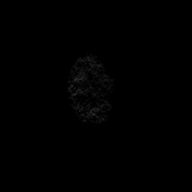

[Series 10: T1 · axial · 5.0mm · 0.90mm/px · z∈[-49,+106]mm · 4 of 27 slices shown (2 of 2)]
[im 1/27]
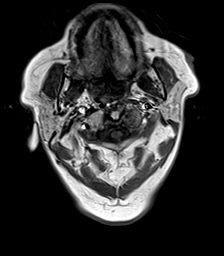
[im 9/27]
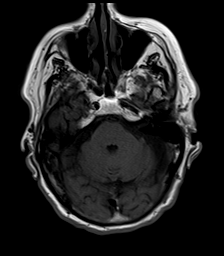
[im 18/27]
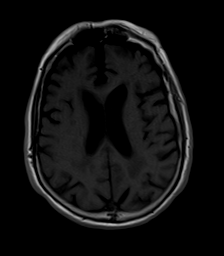
[im 27/27]
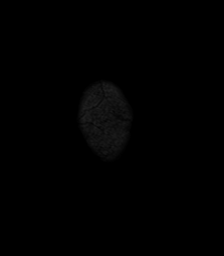

[Series 11: T2 · coronal · 5.0mm · 0.45mm/px · 4 of 32 slices shown (2 of 2)]
[im 1/32]
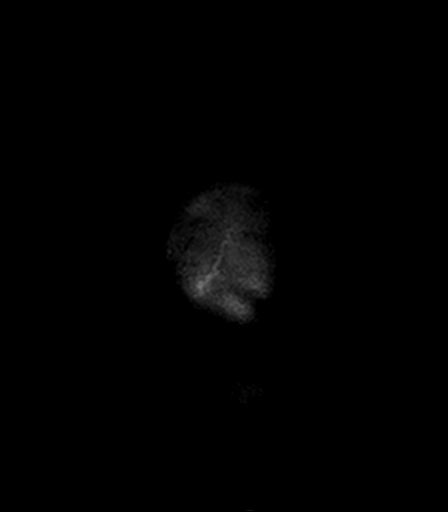
[im 11/32]
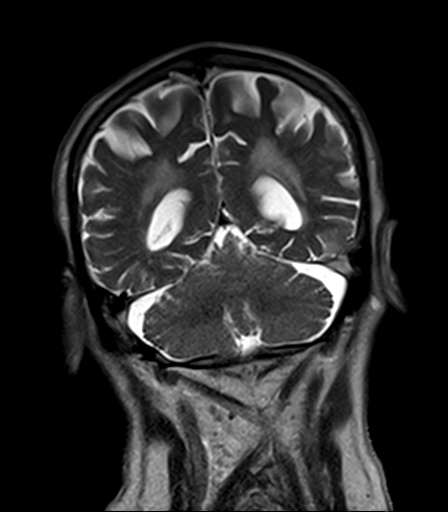
[im 21/32]
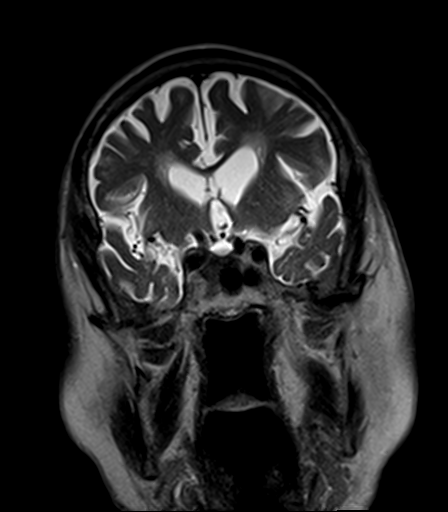
[im 32/32]
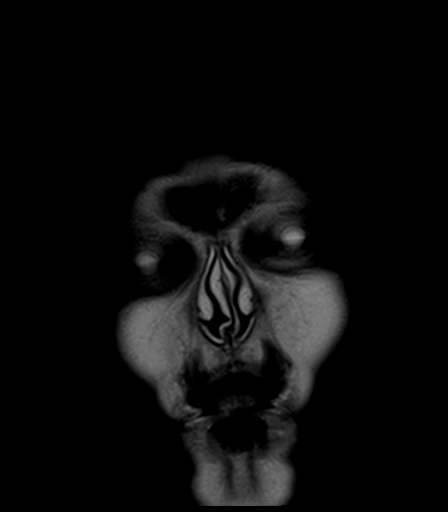

[43 of 48 positions shown; findings below may reference images not displayed]

FINDINGS: Brain: Diffusion imaging does not show any acute or subacute
infarction. Brainstem and cerebellum are normal. Cerebral
hemispheres show atrophy with moderate chronic small-vessel ischemic
changes of the deep white matter. Old lacunar infarction lateral
right thalamus. No large vessel territory infarction. No mass
lesion, hemorrhage, hydrocephalus or extra-axial collection.

Vascular: Major vessels at the base of the brain show flow.

Skull and upper cervical spine: Negative

Sinuses/Orbits: Clear/normal

Other: None
IMPRESSION: No acute finding. Age related atrophy. Chronic small-vessel ischemic
changes as outlined above.
